# Patient Record
Sex: Female | Born: 1945 | Race: White | Hispanic: No | Marital: Married | Smoking: Never smoker
Health system: Southern US, Community
[De-identification: ages and names within clinical notes are randomized; demographics above are authoritative.]

## PROBLEM LIST (undated history)

## (undated) DIAGNOSIS — A310 Pulmonary mycobacterial infection: Secondary | ICD-10-CM

## (undated) DIAGNOSIS — E785 Hyperlipidemia, unspecified: Secondary | ICD-10-CM

## (undated) HISTORY — PX: BREAST CYST ASPIRATION: SHX578

## (undated) HISTORY — DX: Hyperlipidemia, unspecified: E78.5

## (undated) HISTORY — PX: ABDOMINAL HYSTERECTOMY: SHX81

## (undated) HISTORY — DX: Pulmonary mycobacterial infection: A31.0

---
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
## 2005-06-19 ENCOUNTER — Encounter: Admission: RE | Admit: 2005-06-19 | Discharge: 2005-06-19 | Payer: Self-pay | Admitting: Family Medicine

## 2006-02-27 ENCOUNTER — Encounter: Admission: RE | Admit: 2006-02-27 | Discharge: 2006-02-27 | Payer: Self-pay | Admitting: Family Medicine

## 2007-03-26 ENCOUNTER — Encounter: Admission: RE | Admit: 2007-03-26 | Discharge: 2007-03-26 | Payer: Self-pay | Admitting: Family Medicine

## 2008-05-25 ENCOUNTER — Encounter: Admission: RE | Admit: 2008-05-25 | Discharge: 2008-05-25 | Payer: Self-pay | Admitting: Family Medicine

## 2010-03-05 ENCOUNTER — Encounter: Admission: RE | Admit: 2010-03-05 | Discharge: 2010-03-05 | Payer: Self-pay

## 2010-03-12 ENCOUNTER — Encounter: Admission: RE | Admit: 2010-03-12 | Discharge: 2010-03-12 | Payer: Self-pay

## 2010-12-07 ENCOUNTER — Other Ambulatory Visit: Payer: Self-pay | Admitting: Obstetrics and Gynecology

## 2010-12-07 DIAGNOSIS — N644 Mastodynia: Secondary | ICD-10-CM

## 2010-12-07 DIAGNOSIS — N6320 Unspecified lump in the left breast, unspecified quadrant: Secondary | ICD-10-CM

## 2010-12-07 DIAGNOSIS — M8580 Other specified disorders of bone density and structure, unspecified site: Secondary | ICD-10-CM

## 2010-12-14 ENCOUNTER — Other Ambulatory Visit: Payer: Self-pay

## 2010-12-24 ENCOUNTER — Other Ambulatory Visit: Payer: Self-pay

## 2010-12-25 ENCOUNTER — Ambulatory Visit
Admission: RE | Admit: 2010-12-25 | Discharge: 2010-12-25 | Disposition: A | Discharge status: Home / Self Care | Payer: Medicare Other | Source: Ambulatory Visit | Attending: Obstetrics and Gynecology | Admitting: Obstetrics and Gynecology

## 2010-12-25 ENCOUNTER — Other Ambulatory Visit: Payer: Self-pay | Admitting: Obstetrics and Gynecology

## 2010-12-25 DIAGNOSIS — N6320 Unspecified lump in the left breast, unspecified quadrant: Secondary | ICD-10-CM

## 2010-12-25 DIAGNOSIS — N644 Mastodynia: Secondary | ICD-10-CM

## 2010-12-25 DIAGNOSIS — M8580 Other specified disorders of bone density and structure, unspecified site: Secondary | ICD-10-CM

## 2011-04-19 ENCOUNTER — Other Ambulatory Visit: Payer: Self-pay | Admitting: Obstetrics and Gynecology

## 2011-04-19 DIAGNOSIS — Z1231 Encounter for screening mammogram for malignant neoplasm of breast: Secondary | ICD-10-CM

## 2011-05-15 ENCOUNTER — Ambulatory Visit
Admission: RE | Admit: 2011-05-15 | Discharge: 2011-05-15 | Disposition: A | Discharge status: Home / Self Care | Payer: Medicare Other | Source: Ambulatory Visit | Attending: Obstetrics and Gynecology | Admitting: Obstetrics and Gynecology

## 2011-05-15 DIAGNOSIS — Z1231 Encounter for screening mammogram for malignant neoplasm of breast: Secondary | ICD-10-CM

## 2011-07-03 DIAGNOSIS — Z23 Encounter for immunization: Secondary | ICD-10-CM | POA: Diagnosis not present

## 2011-08-19 DIAGNOSIS — F4323 Adjustment disorder with mixed anxiety and depressed mood: Secondary | ICD-10-CM | POA: Diagnosis not present

## 2011-08-22 DIAGNOSIS — F4323 Adjustment disorder with mixed anxiety and depressed mood: Secondary | ICD-10-CM | POA: Diagnosis not present

## 2011-08-29 DIAGNOSIS — F4323 Adjustment disorder with mixed anxiety and depressed mood: Secondary | ICD-10-CM | POA: Diagnosis not present

## 2011-09-04 DIAGNOSIS — F4323 Adjustment disorder with mixed anxiety and depressed mood: Secondary | ICD-10-CM | POA: Diagnosis not present

## 2012-02-26 DIAGNOSIS — R7309 Other abnormal glucose: Secondary | ICD-10-CM | POA: Diagnosis not present

## 2012-02-26 DIAGNOSIS — Z0000 Encounter for general adult medical examination without abnormal findings: Secondary | ICD-10-CM | POA: Diagnosis not present

## 2012-02-26 DIAGNOSIS — E785 Hyperlipidemia, unspecified: Secondary | ICD-10-CM | POA: Diagnosis not present

## 2012-02-26 DIAGNOSIS — D518 Other vitamin B12 deficiency anemias: Secondary | ICD-10-CM | POA: Diagnosis not present

## 2012-02-26 DIAGNOSIS — E559 Vitamin D deficiency, unspecified: Secondary | ICD-10-CM | POA: Diagnosis not present

## 2012-04-20 ENCOUNTER — Other Ambulatory Visit: Payer: Self-pay | Admitting: Internal Medicine

## 2012-04-20 DIAGNOSIS — Z1231 Encounter for screening mammogram for malignant neoplasm of breast: Secondary | ICD-10-CM

## 2012-04-21 DIAGNOSIS — K5730 Diverticulosis of large intestine without perforation or abscess without bleeding: Secondary | ICD-10-CM | POA: Diagnosis not present

## 2012-04-21 DIAGNOSIS — Z800 Family history of malignant neoplasm of digestive organs: Secondary | ICD-10-CM | POA: Diagnosis not present

## 2012-04-21 DIAGNOSIS — Z1211 Encounter for screening for malignant neoplasm of colon: Secondary | ICD-10-CM | POA: Diagnosis not present

## 2012-05-22 DIAGNOSIS — Z23 Encounter for immunization: Secondary | ICD-10-CM | POA: Diagnosis not present

## 2012-05-25 ENCOUNTER — Ambulatory Visit: Payer: Medicare Other

## 2012-06-02 ENCOUNTER — Ambulatory Visit
Admission: RE | Admit: 2012-06-02 | Discharge: 2012-06-02 | Disposition: A | Discharge status: Home / Self Care | Payer: Medicare Other | Source: Ambulatory Visit | Attending: Internal Medicine | Admitting: Internal Medicine

## 2012-06-02 DIAGNOSIS — Z1231 Encounter for screening mammogram for malignant neoplasm of breast: Secondary | ICD-10-CM | POA: Diagnosis not present

## 2012-06-15 DIAGNOSIS — Z1211 Encounter for screening for malignant neoplasm of colon: Secondary | ICD-10-CM | POA: Diagnosis not present

## 2012-06-15 DIAGNOSIS — Z800 Family history of malignant neoplasm of digestive organs: Secondary | ICD-10-CM | POA: Diagnosis not present

## 2012-06-15 DIAGNOSIS — D126 Benign neoplasm of colon, unspecified: Secondary | ICD-10-CM | POA: Diagnosis not present

## 2012-09-25 DIAGNOSIS — J0110 Acute frontal sinusitis, unspecified: Secondary | ICD-10-CM | POA: Diagnosis not present

## 2012-11-10 DIAGNOSIS — H43399 Other vitreous opacities, unspecified eye: Secondary | ICD-10-CM | POA: Diagnosis not present

## 2012-11-10 DIAGNOSIS — H04209 Unspecified epiphora, unspecified lacrimal gland: Secondary | ICD-10-CM | POA: Diagnosis not present

## 2013-02-26 DIAGNOSIS — Z9181 History of falling: Secondary | ICD-10-CM | POA: Diagnosis not present

## 2013-02-26 DIAGNOSIS — R209 Unspecified disturbances of skin sensation: Secondary | ICD-10-CM | POA: Diagnosis not present

## 2013-02-26 DIAGNOSIS — Z0000 Encounter for general adult medical examination without abnormal findings: Secondary | ICD-10-CM | POA: Diagnosis not present

## 2013-02-26 DIAGNOSIS — Z0100 Encounter for examination of eyes and vision without abnormal findings: Secondary | ICD-10-CM | POA: Diagnosis not present

## 2013-02-26 DIAGNOSIS — E785 Hyperlipidemia, unspecified: Secondary | ICD-10-CM | POA: Diagnosis not present

## 2013-02-26 DIAGNOSIS — E559 Vitamin D deficiency, unspecified: Secondary | ICD-10-CM | POA: Diagnosis not present

## 2013-02-26 DIAGNOSIS — Z0110 Encounter for examination of ears and hearing without abnormal findings: Secondary | ICD-10-CM | POA: Diagnosis not present

## 2013-05-03 ENCOUNTER — Other Ambulatory Visit: Payer: Self-pay

## 2013-05-03 DIAGNOSIS — Z1231 Encounter for screening mammogram for malignant neoplasm of breast: Secondary | ICD-10-CM

## 2013-05-07 DIAGNOSIS — Z23 Encounter for immunization: Secondary | ICD-10-CM | POA: Diagnosis not present

## 2013-06-02 DIAGNOSIS — H9319 Tinnitus, unspecified ear: Secondary | ICD-10-CM | POA: Diagnosis not present

## 2013-06-02 DIAGNOSIS — H903 Sensorineural hearing loss, bilateral: Secondary | ICD-10-CM | POA: Diagnosis not present

## 2013-06-04 ENCOUNTER — Ambulatory Visit: Payer: Medicare Other

## 2013-06-04 ENCOUNTER — Ambulatory Visit
Admission: RE | Admit: 2013-06-04 | Discharge: 2013-06-04 | Disposition: A | Discharge status: Home / Self Care | Payer: Medicare Other | Source: Ambulatory Visit

## 2013-06-04 DIAGNOSIS — Z1231 Encounter for screening mammogram for malignant neoplasm of breast: Secondary | ICD-10-CM | POA: Diagnosis not present

## 2013-08-27 DIAGNOSIS — M25559 Pain in unspecified hip: Secondary | ICD-10-CM | POA: Diagnosis not present

## 2013-09-02 DIAGNOSIS — M79609 Pain in unspecified limb: Secondary | ICD-10-CM | POA: Diagnosis not present

## 2013-12-06 DIAGNOSIS — M1610 Unilateral primary osteoarthritis, unspecified hip: Secondary | ICD-10-CM | POA: Diagnosis not present

## 2013-12-06 DIAGNOSIS — M169 Osteoarthritis of hip, unspecified: Secondary | ICD-10-CM | POA: Diagnosis not present

## 2013-12-06 DIAGNOSIS — M999 Biomechanical lesion, unspecified: Secondary | ICD-10-CM | POA: Diagnosis not present

## 2013-12-06 DIAGNOSIS — M76899 Other specified enthesopathies of unspecified lower limb, excluding foot: Secondary | ICD-10-CM | POA: Diagnosis not present

## 2013-12-08 DIAGNOSIS — M169 Osteoarthritis of hip, unspecified: Secondary | ICD-10-CM | POA: Diagnosis not present

## 2013-12-08 DIAGNOSIS — M1610 Unilateral primary osteoarthritis, unspecified hip: Secondary | ICD-10-CM | POA: Diagnosis not present

## 2013-12-08 DIAGNOSIS — M76899 Other specified enthesopathies of unspecified lower limb, excluding foot: Secondary | ICD-10-CM | POA: Diagnosis not present

## 2013-12-08 DIAGNOSIS — M999 Biomechanical lesion, unspecified: Secondary | ICD-10-CM | POA: Diagnosis not present

## 2013-12-13 DIAGNOSIS — M169 Osteoarthritis of hip, unspecified: Secondary | ICD-10-CM | POA: Diagnosis not present

## 2013-12-13 DIAGNOSIS — M1610 Unilateral primary osteoarthritis, unspecified hip: Secondary | ICD-10-CM | POA: Diagnosis not present

## 2013-12-13 DIAGNOSIS — M999 Biomechanical lesion, unspecified: Secondary | ICD-10-CM | POA: Diagnosis not present

## 2013-12-13 DIAGNOSIS — M76899 Other specified enthesopathies of unspecified lower limb, excluding foot: Secondary | ICD-10-CM | POA: Diagnosis not present

## 2013-12-15 DIAGNOSIS — M76899 Other specified enthesopathies of unspecified lower limb, excluding foot: Secondary | ICD-10-CM | POA: Diagnosis not present

## 2013-12-15 DIAGNOSIS — M999 Biomechanical lesion, unspecified: Secondary | ICD-10-CM | POA: Diagnosis not present

## 2013-12-15 DIAGNOSIS — M1610 Unilateral primary osteoarthritis, unspecified hip: Secondary | ICD-10-CM | POA: Diagnosis not present

## 2013-12-15 DIAGNOSIS — M169 Osteoarthritis of hip, unspecified: Secondary | ICD-10-CM | POA: Diagnosis not present

## 2013-12-22 DIAGNOSIS — M169 Osteoarthritis of hip, unspecified: Secondary | ICD-10-CM | POA: Diagnosis not present

## 2013-12-22 DIAGNOSIS — M1610 Unilateral primary osteoarthritis, unspecified hip: Secondary | ICD-10-CM | POA: Diagnosis not present

## 2013-12-27 DIAGNOSIS — M25559 Pain in unspecified hip: Secondary | ICD-10-CM | POA: Diagnosis not present

## 2014-02-01 DIAGNOSIS — M25559 Pain in unspecified hip: Secondary | ICD-10-CM | POA: Diagnosis not present

## 2014-03-07 DIAGNOSIS — Z0000 Encounter for general adult medical examination without abnormal findings: Secondary | ICD-10-CM | POA: Diagnosis not present

## 2014-03-07 DIAGNOSIS — E7800 Pure hypercholesterolemia, unspecified: Secondary | ICD-10-CM | POA: Diagnosis not present

## 2014-03-07 DIAGNOSIS — E559 Vitamin D deficiency, unspecified: Secondary | ICD-10-CM | POA: Diagnosis not present

## 2014-03-07 DIAGNOSIS — N951 Menopausal and female climacteric states: Secondary | ICD-10-CM | POA: Diagnosis not present

## 2014-03-07 DIAGNOSIS — M948X9 Other specified disorders of cartilage, unspecified sites: Secondary | ICD-10-CM | POA: Diagnosis not present

## 2014-03-07 DIAGNOSIS — M2550 Pain in unspecified joint: Secondary | ICD-10-CM | POA: Diagnosis not present

## 2014-03-07 DIAGNOSIS — R7309 Other abnormal glucose: Secondary | ICD-10-CM | POA: Diagnosis not present

## 2014-03-07 DIAGNOSIS — M25559 Pain in unspecified hip: Secondary | ICD-10-CM | POA: Diagnosis not present

## 2014-05-03 ENCOUNTER — Other Ambulatory Visit: Payer: Self-pay | Admitting: Internal Medicine

## 2014-05-04 ENCOUNTER — Other Ambulatory Visit: Payer: Self-pay

## 2014-05-04 ENCOUNTER — Other Ambulatory Visit: Payer: Self-pay | Admitting: Internal Medicine

## 2014-05-04 DIAGNOSIS — M8580 Other specified disorders of bone density and structure, unspecified site: Secondary | ICD-10-CM

## 2014-05-04 DIAGNOSIS — Z1231 Encounter for screening mammogram for malignant neoplasm of breast: Secondary | ICD-10-CM

## 2014-05-09 DIAGNOSIS — L62 Nail disorders in diseases classified elsewhere: Secondary | ICD-10-CM | POA: Diagnosis not present

## 2014-05-09 DIAGNOSIS — B351 Tinea unguium: Secondary | ICD-10-CM | POA: Diagnosis not present

## 2014-05-31 DIAGNOSIS — Z23 Encounter for immunization: Secondary | ICD-10-CM | POA: Diagnosis not present

## 2014-06-06 ENCOUNTER — Ambulatory Visit
Admission: RE | Admit: 2014-06-06 | Discharge: 2014-06-06 | Disposition: A | Discharge status: Home / Self Care | Payer: Medicare Other | Source: Ambulatory Visit | Attending: Internal Medicine | Admitting: Internal Medicine

## 2014-06-06 ENCOUNTER — Ambulatory Visit
Admission: RE | Admit: 2014-06-06 | Discharge: 2014-06-06 | Disposition: A | Discharge status: Home / Self Care | Payer: Medicare Other | Source: Ambulatory Visit

## 2014-06-06 DIAGNOSIS — M8580 Other specified disorders of bone density and structure, unspecified site: Secondary | ICD-10-CM

## 2014-06-06 DIAGNOSIS — M85852 Other specified disorders of bone density and structure, left thigh: Secondary | ICD-10-CM | POA: Diagnosis not present

## 2014-06-06 DIAGNOSIS — Z1231 Encounter for screening mammogram for malignant neoplasm of breast: Secondary | ICD-10-CM | POA: Diagnosis not present

## 2014-06-06 DIAGNOSIS — Z780 Asymptomatic menopausal state: Secondary | ICD-10-CM | POA: Diagnosis not present

## 2014-06-07 ENCOUNTER — Other Ambulatory Visit: Payer: Self-pay | Admitting: Internal Medicine

## 2014-06-07 DIAGNOSIS — R928 Other abnormal and inconclusive findings on diagnostic imaging of breast: Secondary | ICD-10-CM

## 2014-06-15 ENCOUNTER — Ambulatory Visit
Admission: RE | Admit: 2014-06-15 | Discharge: 2014-06-15 | Disposition: A | Discharge status: Home / Self Care | Payer: Medicare Other | Source: Ambulatory Visit | Attending: Internal Medicine | Admitting: Internal Medicine

## 2014-06-15 DIAGNOSIS — N6001 Solitary cyst of right breast: Secondary | ICD-10-CM | POA: Diagnosis not present

## 2014-06-15 DIAGNOSIS — R928 Other abnormal and inconclusive findings on diagnostic imaging of breast: Secondary | ICD-10-CM

## 2014-10-12 DIAGNOSIS — M1611 Unilateral primary osteoarthritis, right hip: Secondary | ICD-10-CM | POA: Diagnosis not present

## 2014-10-12 DIAGNOSIS — M79604 Pain in right leg: Secondary | ICD-10-CM | POA: Diagnosis not present

## 2014-10-12 DIAGNOSIS — M25551 Pain in right hip: Secondary | ICD-10-CM | POA: Diagnosis not present

## 2015-02-01 DIAGNOSIS — H2513 Age-related nuclear cataract, bilateral: Secondary | ICD-10-CM | POA: Diagnosis not present

## 2015-03-20 DIAGNOSIS — E785 Hyperlipidemia, unspecified: Secondary | ICD-10-CM | POA: Diagnosis not present

## 2015-03-20 DIAGNOSIS — E559 Vitamin D deficiency, unspecified: Secondary | ICD-10-CM | POA: Diagnosis not present

## 2015-03-20 DIAGNOSIS — R7309 Other abnormal glucose: Secondary | ICD-10-CM | POA: Diagnosis not present

## 2015-03-20 DIAGNOSIS — Z0000 Encounter for general adult medical examination without abnormal findings: Secondary | ICD-10-CM | POA: Diagnosis not present

## 2015-03-28 DIAGNOSIS — Z0000 Encounter for general adult medical examination without abnormal findings: Secondary | ICD-10-CM | POA: Diagnosis not present

## 2015-03-28 DIAGNOSIS — M25551 Pain in right hip: Secondary | ICD-10-CM | POA: Diagnosis not present

## 2015-03-28 DIAGNOSIS — E559 Vitamin D deficiency, unspecified: Secondary | ICD-10-CM | POA: Diagnosis not present

## 2015-03-28 DIAGNOSIS — Z23 Encounter for immunization: Secondary | ICD-10-CM | POA: Diagnosis not present

## 2015-03-28 DIAGNOSIS — R7309 Other abnormal glucose: Secondary | ICD-10-CM | POA: Diagnosis not present

## 2015-05-08 ENCOUNTER — Other Ambulatory Visit: Payer: Self-pay

## 2015-05-08 DIAGNOSIS — Z1231 Encounter for screening mammogram for malignant neoplasm of breast: Secondary | ICD-10-CM

## 2015-06-08 ENCOUNTER — Ambulatory Visit
Admission: RE | Admit: 2015-06-08 | Discharge: 2015-06-08 | Disposition: A | Discharge status: Home / Self Care | Payer: Medicare Other | Source: Ambulatory Visit

## 2015-06-08 DIAGNOSIS — Z1231 Encounter for screening mammogram for malignant neoplasm of breast: Secondary | ICD-10-CM | POA: Diagnosis not present

## 2015-06-14 ENCOUNTER — Other Ambulatory Visit: Payer: Self-pay | Admitting: Internal Medicine

## 2015-06-14 DIAGNOSIS — R928 Other abnormal and inconclusive findings on diagnostic imaging of breast: Secondary | ICD-10-CM

## 2015-06-21 ENCOUNTER — Ambulatory Visit
Admission: RE | Admit: 2015-06-21 | Discharge: 2015-06-21 | Disposition: A | Discharge status: Home / Self Care | Payer: Medicare Other | Source: Ambulatory Visit | Attending: Internal Medicine | Admitting: Internal Medicine

## 2015-06-21 ENCOUNTER — Other Ambulatory Visit: Payer: Medicare Other

## 2015-06-21 DIAGNOSIS — R928 Other abnormal and inconclusive findings on diagnostic imaging of breast: Secondary | ICD-10-CM

## 2015-06-21 DIAGNOSIS — N6001 Solitary cyst of right breast: Secondary | ICD-10-CM | POA: Diagnosis not present

## 2015-07-27 DIAGNOSIS — J321 Chronic frontal sinusitis: Secondary | ICD-10-CM | POA: Diagnosis not present

## 2015-09-07 DIAGNOSIS — L02612 Cutaneous abscess of left foot: Secondary | ICD-10-CM | POA: Diagnosis not present

## 2015-09-07 DIAGNOSIS — L602 Onychogryphosis: Secondary | ICD-10-CM | POA: Diagnosis not present

## 2015-09-07 DIAGNOSIS — M79675 Pain in left toe(s): Secondary | ICD-10-CM | POA: Diagnosis not present

## 2015-09-07 DIAGNOSIS — L03032 Cellulitis of left toe: Secondary | ICD-10-CM | POA: Diagnosis not present

## 2015-09-08 DIAGNOSIS — L602 Onychogryphosis: Secondary | ICD-10-CM | POA: Diagnosis not present

## 2015-09-08 DIAGNOSIS — B351 Tinea unguium: Secondary | ICD-10-CM | POA: Diagnosis not present

## 2015-09-18 DIAGNOSIS — L03032 Cellulitis of left toe: Secondary | ICD-10-CM | POA: Diagnosis not present

## 2015-09-18 DIAGNOSIS — L602 Onychogryphosis: Secondary | ICD-10-CM | POA: Diagnosis not present

## 2015-10-03 ENCOUNTER — Other Ambulatory Visit: Payer: Self-pay

## 2015-10-03 DIAGNOSIS — Z1231 Encounter for screening mammogram for malignant neoplasm of breast: Secondary | ICD-10-CM

## 2016-01-18 DIAGNOSIS — F329 Major depressive disorder, single episode, unspecified: Secondary | ICD-10-CM | POA: Diagnosis not present

## 2016-01-18 DIAGNOSIS — R7309 Other abnormal glucose: Secondary | ICD-10-CM | POA: Diagnosis not present

## 2016-01-18 DIAGNOSIS — R5383 Other fatigue: Secondary | ICD-10-CM | POA: Diagnosis not present

## 2016-01-18 DIAGNOSIS — Z87891 Personal history of nicotine dependence: Secondary | ICD-10-CM | POA: Diagnosis not present

## 2016-02-26 DIAGNOSIS — L602 Onychogryphosis: Secondary | ICD-10-CM | POA: Diagnosis not present

## 2016-02-26 DIAGNOSIS — L03031 Cellulitis of right toe: Secondary | ICD-10-CM | POA: Diagnosis not present

## 2016-02-26 DIAGNOSIS — M79675 Pain in left toe(s): Secondary | ICD-10-CM | POA: Diagnosis not present

## 2016-02-26 DIAGNOSIS — L02612 Cutaneous abscess of left foot: Secondary | ICD-10-CM | POA: Diagnosis not present

## 2016-02-26 DIAGNOSIS — L03032 Cellulitis of left toe: Secondary | ICD-10-CM | POA: Diagnosis not present

## 2016-03-05 DIAGNOSIS — Z79899 Other long term (current) drug therapy: Secondary | ICD-10-CM | POA: Diagnosis not present

## 2016-03-05 DIAGNOSIS — G4700 Insomnia, unspecified: Secondary | ICD-10-CM | POA: Diagnosis not present

## 2016-03-05 DIAGNOSIS — F329 Major depressive disorder, single episode, unspecified: Secondary | ICD-10-CM | POA: Diagnosis not present

## 2016-04-11 DIAGNOSIS — R7309 Other abnormal glucose: Secondary | ICD-10-CM | POA: Diagnosis not present

## 2016-04-11 DIAGNOSIS — E7800 Pure hypercholesterolemia, unspecified: Secondary | ICD-10-CM | POA: Diagnosis not present

## 2016-04-17 DIAGNOSIS — Z01419 Encounter for gynecological examination (general) (routine) without abnormal findings: Secondary | ICD-10-CM | POA: Diagnosis not present

## 2016-04-17 DIAGNOSIS — Z87891 Personal history of nicotine dependence: Secondary | ICD-10-CM | POA: Diagnosis not present

## 2016-04-17 DIAGNOSIS — Z0000 Encounter for general adult medical examination without abnormal findings: Secondary | ICD-10-CM | POA: Diagnosis not present

## 2016-04-17 DIAGNOSIS — Z1212 Encounter for screening for malignant neoplasm of rectum: Secondary | ICD-10-CM | POA: Diagnosis not present

## 2016-04-17 DIAGNOSIS — Z6827 Body mass index (BMI) 27.0-27.9, adult: Secondary | ICD-10-CM | POA: Diagnosis not present

## 2016-04-17 DIAGNOSIS — Z124 Encounter for screening for malignant neoplasm of cervix: Secondary | ICD-10-CM | POA: Diagnosis not present

## 2016-04-17 DIAGNOSIS — Z23 Encounter for immunization: Secondary | ICD-10-CM | POA: Diagnosis not present

## 2016-06-12 ENCOUNTER — Ambulatory Visit: Payer: Medicare Other

## 2016-07-10 DIAGNOSIS — L720 Epidermal cyst: Secondary | ICD-10-CM | POA: Diagnosis not present

## 2016-07-10 DIAGNOSIS — L814 Other melanin hyperpigmentation: Secondary | ICD-10-CM | POA: Diagnosis not present

## 2016-07-10 DIAGNOSIS — D485 Neoplasm of uncertain behavior of skin: Secondary | ICD-10-CM | POA: Diagnosis not present

## 2016-07-10 DIAGNOSIS — L738 Other specified follicular disorders: Secondary | ICD-10-CM | POA: Diagnosis not present

## 2016-07-10 DIAGNOSIS — L821 Other seborrheic keratosis: Secondary | ICD-10-CM | POA: Diagnosis not present

## 2016-07-10 DIAGNOSIS — L929 Granulomatous disorder of the skin and subcutaneous tissue, unspecified: Secondary | ICD-10-CM | POA: Diagnosis not present

## 2016-07-17 ENCOUNTER — Ambulatory Visit
Admission: RE | Admit: 2016-07-17 | Discharge: 2016-07-17 | Disposition: A | Discharge status: Home / Self Care | Payer: Medicare Other | Source: Ambulatory Visit

## 2016-07-17 DIAGNOSIS — Z1231 Encounter for screening mammogram for malignant neoplasm of breast: Secondary | ICD-10-CM | POA: Diagnosis not present

## 2016-07-18 ENCOUNTER — Other Ambulatory Visit: Payer: Self-pay | Admitting: Internal Medicine

## 2016-07-18 ENCOUNTER — Other Ambulatory Visit: Payer: Self-pay | Admitting: Nurse Practitioner

## 2016-07-18 DIAGNOSIS — R928 Other abnormal and inconclusive findings on diagnostic imaging of breast: Secondary | ICD-10-CM

## 2016-07-25 ENCOUNTER — Ambulatory Visit
Admission: RE | Admit: 2016-07-25 | Discharge: 2016-07-25 | Disposition: A | Discharge status: Home / Self Care | Payer: Medicare Other | Source: Ambulatory Visit | Attending: Internal Medicine | Admitting: Internal Medicine

## 2016-07-25 DIAGNOSIS — R928 Other abnormal and inconclusive findings on diagnostic imaging of breast: Secondary | ICD-10-CM

## 2016-07-25 DIAGNOSIS — N6001 Solitary cyst of right breast: Secondary | ICD-10-CM | POA: Diagnosis not present

## 2016-07-25 DIAGNOSIS — N6311 Unspecified lump in the right breast, upper outer quadrant: Secondary | ICD-10-CM | POA: Diagnosis not present

## 2016-07-29 ENCOUNTER — Other Ambulatory Visit: Payer: Medicare Other

## 2016-08-05 DIAGNOSIS — R7309 Other abnormal glucose: Secondary | ICD-10-CM | POA: Diagnosis not present

## 2016-08-05 DIAGNOSIS — G4700 Insomnia, unspecified: Secondary | ICD-10-CM | POA: Diagnosis not present

## 2016-08-05 DIAGNOSIS — R0982 Postnasal drip: Secondary | ICD-10-CM | POA: Diagnosis not present

## 2016-08-05 DIAGNOSIS — L659 Nonscarring hair loss, unspecified: Secondary | ICD-10-CM | POA: Diagnosis not present

## 2016-10-15 DIAGNOSIS — L658 Other specified nonscarring hair loss: Secondary | ICD-10-CM | POA: Diagnosis not present

## 2016-10-15 DIAGNOSIS — D2230 Melanocytic nevi of unspecified part of face: Secondary | ICD-10-CM | POA: Diagnosis not present

## 2016-10-15 DIAGNOSIS — L720 Epidermal cyst: Secondary | ICD-10-CM | POA: Diagnosis not present

## 2016-10-30 DIAGNOSIS — M25775 Osteophyte, left foot: Secondary | ICD-10-CM | POA: Diagnosis not present

## 2016-10-30 DIAGNOSIS — L600 Ingrowing nail: Secondary | ICD-10-CM | POA: Diagnosis not present

## 2016-12-11 DIAGNOSIS — J321 Chronic frontal sinusitis: Secondary | ICD-10-CM | POA: Diagnosis not present

## 2016-12-11 DIAGNOSIS — R0982 Postnasal drip: Secondary | ICD-10-CM | POA: Diagnosis not present

## 2017-01-06 ENCOUNTER — Other Ambulatory Visit: Payer: Self-pay

## 2017-04-15 DIAGNOSIS — Z0000 Encounter for general adult medical examination without abnormal findings: Secondary | ICD-10-CM | POA: Diagnosis not present

## 2017-04-15 DIAGNOSIS — E559 Vitamin D deficiency, unspecified: Secondary | ICD-10-CM | POA: Diagnosis not present

## 2017-04-15 DIAGNOSIS — Z79899 Other long term (current) drug therapy: Secondary | ICD-10-CM | POA: Diagnosis not present

## 2017-04-21 DIAGNOSIS — Z23 Encounter for immunization: Secondary | ICD-10-CM | POA: Diagnosis not present

## 2017-04-21 DIAGNOSIS — E2839 Other primary ovarian failure: Secondary | ICD-10-CM | POA: Diagnosis not present

## 2017-04-21 DIAGNOSIS — Z0000 Encounter for general adult medical examination without abnormal findings: Secondary | ICD-10-CM | POA: Diagnosis not present

## 2017-04-24 ENCOUNTER — Other Ambulatory Visit: Payer: Self-pay | Admitting: Internal Medicine

## 2017-04-24 DIAGNOSIS — E2839 Other primary ovarian failure: Secondary | ICD-10-CM

## 2017-06-13 ENCOUNTER — Ambulatory Visit
Admission: RE | Admit: 2017-06-13 | Discharge: 2017-06-13 | Disposition: A | Discharge status: Home / Self Care | Payer: Medicare Other | Source: Ambulatory Visit | Attending: Internal Medicine | Admitting: Internal Medicine

## 2017-06-13 DIAGNOSIS — E2839 Other primary ovarian failure: Secondary | ICD-10-CM

## 2017-06-13 DIAGNOSIS — M85852 Other specified disorders of bone density and structure, left thigh: Secondary | ICD-10-CM | POA: Diagnosis not present

## 2017-06-27 DIAGNOSIS — M25562 Pain in left knee: Secondary | ICD-10-CM | POA: Diagnosis not present

## 2017-06-27 DIAGNOSIS — M1712 Unilateral primary osteoarthritis, left knee: Secondary | ICD-10-CM | POA: Diagnosis not present

## 2017-07-02 ENCOUNTER — Other Ambulatory Visit: Payer: Self-pay | Admitting: Internal Medicine

## 2017-07-02 DIAGNOSIS — Z1231 Encounter for screening mammogram for malignant neoplasm of breast: Secondary | ICD-10-CM

## 2017-07-09 DIAGNOSIS — J320 Chronic maxillary sinusitis: Secondary | ICD-10-CM | POA: Diagnosis not present

## 2017-07-22 ENCOUNTER — Ambulatory Visit
Admission: RE | Admit: 2017-07-22 | Discharge: 2017-07-22 | Disposition: A | Discharge status: Home / Self Care | Payer: Medicare Other | Source: Ambulatory Visit | Attending: Internal Medicine | Admitting: Internal Medicine

## 2017-07-22 DIAGNOSIS — M1712 Unilateral primary osteoarthritis, left knee: Secondary | ICD-10-CM | POA: Diagnosis not present

## 2017-07-22 DIAGNOSIS — M25562 Pain in left knee: Secondary | ICD-10-CM | POA: Diagnosis not present

## 2017-07-22 DIAGNOSIS — Z1231 Encounter for screening mammogram for malignant neoplasm of breast: Secondary | ICD-10-CM

## 2017-07-23 DIAGNOSIS — M25562 Pain in left knee: Secondary | ICD-10-CM | POA: Diagnosis not present

## 2017-07-23 DIAGNOSIS — M1712 Unilateral primary osteoarthritis, left knee: Secondary | ICD-10-CM | POA: Diagnosis not present

## 2017-07-28 DIAGNOSIS — M25562 Pain in left knee: Secondary | ICD-10-CM | POA: Diagnosis not present

## 2017-07-28 DIAGNOSIS — M1712 Unilateral primary osteoarthritis, left knee: Secondary | ICD-10-CM | POA: Diagnosis not present

## 2017-07-30 DIAGNOSIS — M1712 Unilateral primary osteoarthritis, left knee: Secondary | ICD-10-CM | POA: Diagnosis not present

## 2017-07-30 DIAGNOSIS — M25562 Pain in left knee: Secondary | ICD-10-CM | POA: Diagnosis not present

## 2017-08-04 DIAGNOSIS — M1712 Unilateral primary osteoarthritis, left knee: Secondary | ICD-10-CM | POA: Diagnosis not present

## 2017-08-04 DIAGNOSIS — M25562 Pain in left knee: Secondary | ICD-10-CM | POA: Diagnosis not present

## 2017-08-06 DIAGNOSIS — M25562 Pain in left knee: Secondary | ICD-10-CM | POA: Diagnosis not present

## 2017-08-06 DIAGNOSIS — M1712 Unilateral primary osteoarthritis, left knee: Secondary | ICD-10-CM | POA: Diagnosis not present

## 2017-08-11 DIAGNOSIS — M1712 Unilateral primary osteoarthritis, left knee: Secondary | ICD-10-CM | POA: Diagnosis not present

## 2017-08-11 DIAGNOSIS — M25562 Pain in left knee: Secondary | ICD-10-CM | POA: Diagnosis not present

## 2017-08-13 DIAGNOSIS — M1712 Unilateral primary osteoarthritis, left knee: Secondary | ICD-10-CM | POA: Diagnosis not present

## 2017-08-13 DIAGNOSIS — M25562 Pain in left knee: Secondary | ICD-10-CM | POA: Diagnosis not present

## 2017-08-18 DIAGNOSIS — M25562 Pain in left knee: Secondary | ICD-10-CM | POA: Diagnosis not present

## 2017-08-18 DIAGNOSIS — M1712 Unilateral primary osteoarthritis, left knee: Secondary | ICD-10-CM | POA: Diagnosis not present

## 2017-08-21 DIAGNOSIS — M1712 Unilateral primary osteoarthritis, left knee: Secondary | ICD-10-CM | POA: Diagnosis not present

## 2017-08-21 DIAGNOSIS — M25562 Pain in left knee: Secondary | ICD-10-CM | POA: Diagnosis not present

## 2017-08-25 DIAGNOSIS — M25562 Pain in left knee: Secondary | ICD-10-CM | POA: Diagnosis not present

## 2017-08-25 DIAGNOSIS — M1712 Unilateral primary osteoarthritis, left knee: Secondary | ICD-10-CM | POA: Diagnosis not present

## 2017-08-29 DIAGNOSIS — M25562 Pain in left knee: Secondary | ICD-10-CM | POA: Diagnosis not present

## 2017-08-29 DIAGNOSIS — M1712 Unilateral primary osteoarthritis, left knee: Secondary | ICD-10-CM | POA: Diagnosis not present

## 2017-09-01 DIAGNOSIS — M1712 Unilateral primary osteoarthritis, left knee: Secondary | ICD-10-CM | POA: Diagnosis not present

## 2017-09-01 DIAGNOSIS — M25562 Pain in left knee: Secondary | ICD-10-CM | POA: Diagnosis not present

## 2017-09-03 DIAGNOSIS — M25562 Pain in left knee: Secondary | ICD-10-CM | POA: Diagnosis not present

## 2017-09-03 DIAGNOSIS — M1712 Unilateral primary osteoarthritis, left knee: Secondary | ICD-10-CM | POA: Diagnosis not present

## 2017-09-05 DIAGNOSIS — M1712 Unilateral primary osteoarthritis, left knee: Secondary | ICD-10-CM | POA: Diagnosis not present

## 2017-09-05 DIAGNOSIS — M25562 Pain in left knee: Secondary | ICD-10-CM | POA: Diagnosis not present

## 2017-09-09 DIAGNOSIS — M25562 Pain in left knee: Secondary | ICD-10-CM | POA: Diagnosis not present

## 2017-09-09 DIAGNOSIS — M1712 Unilateral primary osteoarthritis, left knee: Secondary | ICD-10-CM | POA: Diagnosis not present

## 2017-09-11 DIAGNOSIS — M25562 Pain in left knee: Secondary | ICD-10-CM | POA: Diagnosis not present

## 2017-09-11 DIAGNOSIS — M1712 Unilateral primary osteoarthritis, left knee: Secondary | ICD-10-CM | POA: Diagnosis not present

## 2017-09-15 DIAGNOSIS — M25562 Pain in left knee: Secondary | ICD-10-CM | POA: Diagnosis not present

## 2017-09-15 DIAGNOSIS — M1712 Unilateral primary osteoarthritis, left knee: Secondary | ICD-10-CM | POA: Diagnosis not present

## 2017-09-17 DIAGNOSIS — M1712 Unilateral primary osteoarthritis, left knee: Secondary | ICD-10-CM | POA: Diagnosis not present

## 2017-09-17 DIAGNOSIS — M25562 Pain in left knee: Secondary | ICD-10-CM | POA: Diagnosis not present

## 2017-11-11 DIAGNOSIS — R490 Dysphonia: Secondary | ICD-10-CM | POA: Diagnosis not present

## 2017-11-11 DIAGNOSIS — J0130 Acute sphenoidal sinusitis, unspecified: Secondary | ICD-10-CM | POA: Diagnosis not present

## 2017-11-11 DIAGNOSIS — Z7289 Other problems related to lifestyle: Secondary | ICD-10-CM | POA: Diagnosis not present

## 2017-11-11 DIAGNOSIS — J310 Chronic rhinitis: Secondary | ICD-10-CM | POA: Diagnosis not present

## 2017-11-11 DIAGNOSIS — R05 Cough: Secondary | ICD-10-CM | POA: Diagnosis not present

## 2017-11-11 DIAGNOSIS — R0602 Shortness of breath: Secondary | ICD-10-CM | POA: Diagnosis not present

## 2017-11-11 DIAGNOSIS — J323 Chronic sphenoidal sinusitis: Secondary | ICD-10-CM | POA: Diagnosis not present

## 2017-11-11 DIAGNOSIS — R059 Cough, unspecified: Secondary | ICD-10-CM | POA: Insufficient documentation

## 2017-11-21 ENCOUNTER — Encounter: Payer: Self-pay | Admitting: Podiatry

## 2017-11-21 ENCOUNTER — Ambulatory Visit (INDEPENDENT_AMBULATORY_CARE_PROVIDER_SITE_OTHER): Payer: Medicare Other | Admitting: Podiatry

## 2017-11-21 DIAGNOSIS — L600 Ingrowing nail: Secondary | ICD-10-CM

## 2017-11-21 DIAGNOSIS — M79676 Pain in unspecified toe(s): Secondary | ICD-10-CM | POA: Diagnosis not present

## 2017-11-21 NOTE — Patient Instructions (Signed)

## 2017-11-27 ENCOUNTER — Telehealth: Payer: Self-pay | Admitting: Podiatry

## 2017-11-27 NOTE — Telephone Encounter (Signed)
I informed pt the soaks would need to continue for 2-4 weeks until the area gets a dry hard scab without redness, swelling or drainage. Pt states she will be traveling. I told pt she could shower as normal, before getting out of the shower, take a clean wet washcloth and squeeze on antibacterial soap and wipe down the procedure area gently, then rinse well, pat dry and cover with an antibiotic ointment bandaid. Pt states understanding.

## 2017-11-27 NOTE — Telephone Encounter (Signed)
Pt had a toenail removed off of her left foot great toe and wanted to know when she could stop soaking it.

## 2017-11-28 ENCOUNTER — Ambulatory Visit
Admission: RE | Admit: 2017-11-28 | Discharge: 2017-11-28 | Disposition: A | Discharge status: Home / Self Care | Payer: Medicare Other | Source: Ambulatory Visit | Attending: Otolaryngology | Admitting: Otolaryngology

## 2017-11-28 ENCOUNTER — Other Ambulatory Visit: Payer: Self-pay | Admitting: Otolaryngology

## 2017-11-28 DIAGNOSIS — R05 Cough: Secondary | ICD-10-CM

## 2017-11-28 DIAGNOSIS — R059 Cough, unspecified: Secondary | ICD-10-CM

## 2017-12-16 DIAGNOSIS — J181 Lobar pneumonia, unspecified organism: Secondary | ICD-10-CM | POA: Diagnosis not present

## 2017-12-16 DIAGNOSIS — J323 Chronic sphenoidal sinusitis: Secondary | ICD-10-CM | POA: Diagnosis not present

## 2018-01-04 NOTE — Progress Notes (Signed)
  Subjective:  Patient ID: Tracey Castro, female    DOB: [birth year],  MRN: [id number]  Chief Complaint  Patient presents with  . Nail Problem    left great toenail discolored and thick - has a lump in it - has tried soaking in epsom salts   72 y.o. female presents with the above complaint.  Reports that the left great toenail is discolored and thick has a lump in it believes it to be growing.  Has tried soaking Epson salt. History reviewed. No pertinent past medical history. Past Surgical History:  Procedure Laterality Date  . BREAST CYST ASPIRATION      Current Outpatient Medications:  .  albuterol (PROVENTIL HFA;VENTOLIN HFA) 108 (90 Base) MCG/ACT inhaler, Inhale into the lungs., Disp: , Rfl:  .  clindamycin (CLEOCIN) 150 MG capsule, TAKE 1 CAPSULE (150 MG TOTAL) BY MOUTH 3 TIMES DAILY FOR 30 DAYS., Disp: , Rfl: 0 .  fluticasone (FLONASE) 50 MCG/ACT nasal spray, Place into the nose., Disp: , Rfl:   Allergies  Allergen Reactions  . Sulfamethoxazole Rash   Review of Systems: Negative except as noted in the HPI. Denies N/V/F/Ch. Objective:  There were no vitals filed for this visit. General AA&O x3. Normal mood and affect.  Vascular Dorsalis pedis and posterior tibial pulses  present 2+ bilaterally  Capillary refill normal to all digits. Pedal hair growth normal.  Neurologic Epicritic sensation grossly present.  Dermatologic No open lesions. Interspaces clear of maceration. Ingrowing nail medial aspect left great toenail  Orthopedic: MMT 5/5 in dorsiflexion, plantarflexion, inversion, and eversion. Normal joint ROM without pain or crepitus.   Assessment & Plan:  Patient was evaluated and treated and all questions answered.  Ingrown Nail, left -Patient elects to proceed with ingrown toenail removal today -Ingrown nail excised. See procedure note. -Educated on post-procedure care including soaking. Written instructions provided. -Patient to follow up in 2 weeks for nail  check.  Procedure: Excision of Ingrown Toenail Location: Left 1st toe medial nail borders. Anesthesia: Lidocaine 1% plain; 1.56mL and Marcaine 0.5% plain; 1.38mL, digital block. Skin Prep: Betadine. Dressing: Silvadene; telfa; dry, sterile, compression dressing. Technique: Following skin prep, the toe was exsanguinated and a tourniquet was secured at the base of the toe. The affected nail border was freed, split with a nail splitter, and excised. Chemical matrixectomy was then performed with phenol and irrigated out with alcohol. The tourniquet was then removed and sterile dressing applied. Disposition: Patient tolerated procedure well. Patient to return in 2 weeks for follow-up.    Return if symptoms worsen or fail to improve.

## 2018-01-15 DIAGNOSIS — R0602 Shortness of breath: Secondary | ICD-10-CM | POA: Diagnosis not present

## 2018-01-16 ENCOUNTER — Ambulatory Visit
Admission: RE | Admit: 2018-01-16 | Discharge: 2018-01-16 | Disposition: A | Discharge status: Home / Self Care | Payer: Medicare Other | Source: Ambulatory Visit | Attending: Nurse Practitioner | Admitting: Nurse Practitioner

## 2018-01-16 ENCOUNTER — Other Ambulatory Visit: Payer: Self-pay | Admitting: Nurse Practitioner

## 2018-01-16 DIAGNOSIS — J189 Pneumonia, unspecified organism: Secondary | ICD-10-CM | POA: Diagnosis not present

## 2018-01-27 DIAGNOSIS — R0602 Shortness of breath: Secondary | ICD-10-CM | POA: Diagnosis not present

## 2018-01-27 DIAGNOSIS — R609 Edema, unspecified: Secondary | ICD-10-CM | POA: Diagnosis not present

## 2018-01-27 DIAGNOSIS — R202 Paresthesia of skin: Secondary | ICD-10-CM | POA: Diagnosis not present

## 2018-01-28 ENCOUNTER — Other Ambulatory Visit: Payer: Self-pay | Admitting: Nurse Practitioner

## 2018-01-28 DIAGNOSIS — R9389 Abnormal findings on diagnostic imaging of other specified body structures: Secondary | ICD-10-CM

## 2018-02-02 ENCOUNTER — Ambulatory Visit
Admission: RE | Admit: 2018-02-02 | Discharge: 2018-02-02 | Disposition: A | Discharge status: Home / Self Care | Payer: Medicare Other | Source: Ambulatory Visit | Attending: Nurse Practitioner | Admitting: Nurse Practitioner

## 2018-02-02 DIAGNOSIS — R9389 Abnormal findings on diagnostic imaging of other specified body structures: Secondary | ICD-10-CM

## 2018-02-02 DIAGNOSIS — J181 Lobar pneumonia, unspecified organism: Secondary | ICD-10-CM | POA: Diagnosis not present

## 2018-02-19 ENCOUNTER — Encounter: Payer: Self-pay | Admitting: Infectious Diseases

## 2018-02-19 ENCOUNTER — Ambulatory Visit (INDEPENDENT_AMBULATORY_CARE_PROVIDER_SITE_OTHER): Payer: Medicare Other | Admitting: Infectious Diseases

## 2018-02-19 VITALS — BP 118/77 | HR 67 | Temp 97.7°F | Wt 143.4 lb

## 2018-02-19 DIAGNOSIS — A310 Pulmonary mycobacterial infection: Secondary | ICD-10-CM | POA: Diagnosis not present

## 2018-02-19 NOTE — Assessment & Plan Note (Signed)
We spoke about the potential dx- she has only 1 (radiologic) of 2 needed criteria (radiologic and Cx).  Will get sputum Cx, explained if these are (-) will send her for BAL.  Explained the chronicity of disease, length of anbx therapy, and need for multiple anbx in the course.  Explained that therapy does not always cure this and the treatment is often based on symptoms after initial therapy.  Will screen her for TB as well.  Will see her back in 6 weeks.

## 2018-02-19 NOTE — Progress Notes (Signed)
   Subjective:    Patient ID: Tracey Castro, female    DOB: Mar 31, 1946, 72 y.o.   MRN: [id number]  HPI 72 yo F with hx of hyperlipidemia, comes to ID clinic for chronic cough. She had worsening cough and sob 02-2017 and was given a z-pack.  She was seen 8-1 with episodes of SOB/DOE- she had been previously treated with levaquin by ENT for PNA (CXR showing lingular atelectasis and pna 11-28-17).  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   Has had significant cough and sputum production. Yellow. No hemoptysis.   She feels like her DOE has improved, can climb stairs better.   Labs 01-27-18 are notable for  Normal BMp, TSH, CBC.  Her Hgb A1C was 6.2% 03-2017.  Last colon ~ 5 yrs ago Last mammo- Feb 2019  The past medical history, family history and social history were reviewed/updated in EPIC  Smoked < 1/2 ppd briefly, socially.  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   Review of Systems  Constitutional: Negative for appetite change, chills, fever and unexpected weight change.  Respiratory: Positive for cough and shortness of breath.   Cardiovascular: Negative for leg swelling.  Gastrointestinal: Negative for constipation and diarrhea.  Genitourinary: Negative for difficulty urinating.  Hematological: Negative for adenopathy.  Please see HPI. All other systems reviewed and negative.      Objective:   Physical Exam  Constitutional: She is oriented to person, place, and time. She appears well-developed and well-nourished.  HENT:  Mouth/Throat: No oropharyngeal exudate.  Eyes: Pupils are equal, round, and reactive to light. EOM are normal.  Neck: Normal range of motion. Neck supple.  Cardiovascular: Normal rate, regular rhythm and normal heart sounds.  Pulmonary/Chest: Effort normal and breath sounds normal.    Abdominal: Soft. Bowel sounds are normal. She exhibits no distension. There is no tenderness.  Musculoskeletal: She exhibits no edema.  Lymphadenopathy:    She has no cervical adenopathy.  Neurological: She is alert and oriented to person, place, and time.  Skin: Skin is warm and dry.  Psychiatric: She has a normal mood and affect.       Assessment & Plan:

## 2018-02-19 NOTE — Addendum Note (Signed)
Addended by: Dolan Amen D on: 02/19/2018 10:26 AM   Modules accepted: Orders

## 2018-02-21 LAB — QUANTIFERON-TB GOLD PLUS
Mitogen-NIL: 6.69 IU/mL
NIL: 0.02 IU/mL
QuantiFERON-TB Gold Plus: NEGATIVE
TB1-NIL: 0.01 IU/mL
TB2-NIL: 0.01 IU/mL

## 2018-02-25 ENCOUNTER — Other Ambulatory Visit: Payer: Medicare Other

## 2018-02-25 ENCOUNTER — Telehealth: Payer: Self-pay

## 2018-02-25 NOTE — Telephone Encounter (Signed)
Patient was not able to produce sputum sample.  She says you suggested a bronchoscopy if this happens.    Please advise.   Laverle Patter, RN

## 2018-02-27 ENCOUNTER — Other Ambulatory Visit: Payer: Self-pay | Admitting: Infectious Diseases

## 2018-02-27 DIAGNOSIS — A310 Pulmonary mycobacterial infection: Secondary | ICD-10-CM

## 2018-02-27 NOTE — Telephone Encounter (Signed)
referal ordered.

## 2018-03-09 DIAGNOSIS — M25562 Pain in left knee: Secondary | ICD-10-CM | POA: Diagnosis not present

## 2018-03-09 DIAGNOSIS — M1712 Unilateral primary osteoarthritis, left knee: Secondary | ICD-10-CM | POA: Diagnosis not present

## 2018-03-18 ENCOUNTER — Ambulatory Visit (INDEPENDENT_AMBULATORY_CARE_PROVIDER_SITE_OTHER): Payer: Medicare Other | Admitting: Internal Medicine

## 2018-03-18 ENCOUNTER — Encounter: Payer: Self-pay | Admitting: Internal Medicine

## 2018-03-18 ENCOUNTER — Other Ambulatory Visit (INDEPENDENT_AMBULATORY_CARE_PROVIDER_SITE_OTHER): Payer: Medicare Other

## 2018-03-18 VITALS — BP 108/62 | HR 77 | Ht 63.0 in | Wt 147.0 lb

## 2018-03-18 DIAGNOSIS — J449 Chronic obstructive pulmonary disease, unspecified: Secondary | ICD-10-CM | POA: Diagnosis not present

## 2018-03-18 DIAGNOSIS — J479 Bronchiectasis, uncomplicated: Secondary | ICD-10-CM

## 2018-03-18 DIAGNOSIS — R059 Cough, unspecified: Secondary | ICD-10-CM

## 2018-03-18 DIAGNOSIS — R05 Cough: Secondary | ICD-10-CM | POA: Diagnosis not present

## 2018-03-18 LAB — CBC WITH DIFFERENTIAL/PLATELET
Basophils Absolute: 0 10*3/uL (ref 0.0–0.1)
Basophils Relative: 0.5 % (ref 0.0–3.0)
Eosinophils Absolute: 0.1 10*3/uL (ref 0.0–0.7)
Eosinophils Relative: 1.4 % (ref 0.0–5.0)
HCT: 41.3 % (ref 36.0–46.0)
Hemoglobin: 13.8 g/dL (ref 12.0–15.0)
Lymphocytes Relative: 38.8 % (ref 12.0–46.0)
Lymphs Abs: 3.3 10*3/uL (ref 0.7–4.0)
MCHC: 33.3 g/dL (ref 30.0–36.0)
MCV: 85 fl (ref 78.0–100.0)
Monocytes Absolute: 0.7 10*3/uL (ref 0.1–1.0)
Monocytes Relative: 8.6 % (ref 3.0–12.0)
Neutro Abs: 4.3 10*3/uL (ref 1.4–7.7)
Neutrophils Relative %: 50.7 % (ref 43.0–77.0)
Platelets: 357 10*3/uL (ref 150.0–400.0)
RBC: 4.87 Mil/uL (ref 3.87–5.11)
RDW: 13.7 % (ref 11.5–15.5)
WBC: 8.5 10*3/uL (ref 4.0–10.5)

## 2018-03-18 MED ORDER — BUDESONIDE-FORMOTEROL FUMARATE 160-4.5 MCG/ACT IN AERO: 2.0000 | INHALATION_SPRAY | Freq: Two times a day (BID) | RESPIRATORY_TRACT | 0 refills | Status: DC

## 2018-03-18 MED ORDER — BUDESONIDE-FORMOTEROL FUMARATE 160-4.5 MCG/ACT IN AERO: 2.0000 | INHALATION_SPRAY | Freq: Two times a day (BID) | RESPIRATORY_TRACT | 11 refills | Status: DC

## 2018-03-18 NOTE — Patient Instructions (Addendum)
Symbicort 160 Take 2 puffs first thing in am and then another 2 puffs about 12 hours later.   Work on inhaler technique:  relax and gently blow all the way out then take a nice smooth deep breath back in, triggering the inhaler at same time you start breathing in.  Hold for up to 5 seconds if you can. Blow out thru nose. Rinse and gargle with water when done   Bronchiectasis =   you have scarring of your bronchial tubes which means that they don't function perfectly normally and mucus tends to pool in certain areas of your lung which can cause pneumonia and further scarring of your lung and bronchial tubes  Whenever you develop cough congestion take mucinex or mucinex dm  Up to 1200 mg every 12 hours as needed > these will help keep the mucus loose and flowing but if your condition worsens you need to seek help immediately preferably here or somewhere inside the [facility] system to compare xrays ( worse = darker or bloody mucus or pain on breathing in)   Please remember to go to the lab department downstairs in the basement  for your tests - we will call you with the results when they are available.       Come to outpatient registration at [facility] (behind the ER) at 715 am Friday 03/27/18 with nothing to eat or drink after midnight Thursday.for Bronchoscopy     Please schedule a follow up office visit in 6 weeks, call sooner if needed with pfts on return

## 2018-03-18 NOTE — Progress Notes (Signed)
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 admit then perfectly healthy  X for sensation of pnds onset  around 2009 springtime rx otc ok then abrupt onset  "bad cold" w/in a few days of arriving in [place] late Sept  2018 cough lingered but then rx Zpak in Jan 2019 no better then to [facility] [place]/ June 2019 [place] and [facility] [place] > ent eval [facility] eval June 3536 dx slt sinus infection rx  amox diarhea/ changed to levaquin > almost completely resolved but cxr suggested Lingular atx > [facility] eval with CT chest c/w bronchiectasis 02/02/18 so referred to pulmonary clinic 03/18/2018 by Dr   Johnnye Sima     03/18/2018  Pulmonary/ 1st office eval  Chief Complaint  Patient presents with  . pulmonary consult    referred ny Dr. Johnnye Sima for MAI. patient has SOB on exertion   Dyspnea:  Walking nl pace up [place] slt sob s chest tightness since onset of cough  Cough: gone p levaquin rx  Sleep: side / flat bed x one pillow SABA use: ? Helped not sure   No obvious day to day or daytime variability or assoc excess/ purulent sputum or mucus plugs or hemoptysis or cp or chest tightness, subjective wheeze or overt sinus or hb symptoms.   Sleeps as above  without nocturnal  or early am exacerbation  of respiratory  c/o's or need for noct saba. Also denies any obvious fluctuation of symptoms with weather or environmental changes or other aggravating or alleviating factors except as outlined above   No unusual exposure hx or h/o childhood pna/ asthma or knowledge of premature birth.  Current Allergies, Complete Past Medical History, Past Surgical History, Family History, and Social History were reviewed in [employer] record.  ROS  The following are not active complaints unless bolded Hoarseness, sore throat,  dysphagia, dental problems, itching, sneezing,  nasal congestion or discharge of excess mucus or purulent secretions, ear ache,   fever, chills, sweats, unintended wt loss or wt gain, classically pleuritic or exertional cp,  orthopnea pnd or arm/hand swelling  or leg swelling, presyncope, palpitations, abdominal pain, anorexia, nausea, vomiting, diarrhea  or change in bowel habits or change in bladder habits, change in stools or change in urine, dysuria, hematuria,  rash, arthralgias, visual complaints, headache, numbness, weakness or ataxia or problems with walking or coordination,  change in mood or  memory.              Past Medical History:  Diagnosis Date  . Hyperlipidemia   . Mycobacterium avium complex [facility])     Outpatient Medications Prior to Visit  Medication Sig Dispense Refill  . albuterol (PROVENTIL HFA;VENTOLIN HFA) 108 (90 Base) MCG/ACT inhaler Inhale into the lungs.    . fluticasone (FLONASE) 50 MCG/ACT nasal spray Place into the nose.    . Multiple Vitamin (MULTIVITAMIN) capsule Take 1 capsule by mouth daily.    Marland Kitchen VITAMIN D, CHOLECALCIFEROL, PO Take 600 mg by mouth.                Objective:     BP 108/62 (BP Location: Left Arm, Cuff Size: Normal)   Pulse 77   Ht _0  (1.6 m)   Wt 147 lb (66.7 kg)   SpO2  94%   BMI 26.04 kg/m   SpO2: 94 %  RA  Very pleasant healthy appearing wf nad    HEENT: nl dentition / oropharynx. Nl external ear canals without cough reflex -  Mild bilateral non-specific turbinate edema     NECK :  without JVD/Nodes/TM/ nl carotid upstrokes bilaterally   LUNGS: no acc muscle use,  Mild barrel  contour chest wall with bilateral  Distant bs s audible wheeze and  without cough on insp or exp maneuver and mild  Hyperresonant  to  percussion bilaterally     CV:  RRR  no s3 or murmur or increase in P2, and no edema   ABD:  soft and nontender with pos end  insp Hoover's  in the supine position. No bruits or organomegaly appreciated,  bowel sounds nl  MS:   Nl gait/  ext warm without deformities, calf tenderness, cyanosis or clubbing No obvious joint restrictions   SKIN: warm and dry without lesions    NEURO:  alert, approp, nl sensorium with  no motor or cerebellar deficits apparent.    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   COPD GOLD II Spirometry 03/18/2018  FEV1 1.0 (49%)  Ratio 70 prior to any rx - 03/18/2018  After extensive coaching inhaler device,  effectiveness =    75% try symb 160 2bid then repeat pfts on rx in 6 weeks to be sure she's really a GOLD II and not chronic AB given min smoking hx  - [facility] AT screening  03/18/2018  - Allergy profile 03/18/2018 >  Eos 0.1 /  IgE Pending     She minimizes both her symptoms and smoking hx so important to repeat the full pfts as above as she really doesn't look like a GOLD III pt at this point    Total time devoted to counseling  > 50 % of initial 60 min office visit:  review case with pt/husband discussion of  options/alternatives/ personally creating written customized instructions  in presence of pt  then going over those specific  Instructions directly with the pt including how to use all of the meds but in particular covering each new medication in detail and the difference between the maintenance= "automatic" meds and the prns using an action plan format for the latter (If this problem/symptom => do that organization reading Left to right).  Please see AVS from this visit for a full list of these instructions which I personally wrote for this pt and  are unique to  this visit.       Christinia Gully, MD 03/18/2018

## 2018-03-19 ENCOUNTER — Encounter: Payer: Self-pay | Admitting: Internal Medicine

## 2018-03-19 DIAGNOSIS — J449 Chronic obstructive pulmonary disease, unspecified: Secondary | ICD-10-CM | POA: Insufficient documentation

## 2018-03-19 NOTE — Assessment & Plan Note (Signed)
Spirometry 03/18/2018  FEV1 1.0 (49%)  Ratio 70 prior to any rx - 03/18/2018  After extensive coaching inhaler device,  effectiveness =    75% try symb 160 2bid then repeat pfts on rx in 6 weeks to be sure she's really a GOLD II and not chronic AB given min smoking hx  - [employer] AT screening  03/18/2018  - Allergy profile 03/18/2018 >  Eos 0.1 /  IgE Pending     She minimizes both her symptoms and smoking hx so important to repeat the full pfts as above as she really doesn't look like a GOLD III pt at this point    Total time devoted to counseling  > 50 % of initial 60 min office visit:  review case with pt/husband discussion of options/alternatives/ personally creating written customized instructions  in presence of pt  then going over those specific  Instructions directly with the pt including how to use all of the meds but in particular covering each new medication in detail and the difference between the maintenance= "automatic" meds and the prns using an action plan format for the latter (If this problem/symptom => do that organization reading Left to right).  Please see AVS from this visit for a full list of these instructions which I personally wrote for this pt and  are unique to this visit.

## 2018-03-19 NOTE — Assessment & Plan Note (Addendum)
Onset of cough Sept 2019  - CT chest 02/02/18  1. 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

## 2018-03-23 LAB — RESPIRATORY ALLERGY PROFILE REGION II ~~LOC~~

## 2018-03-23 LAB — ALPHA-1 ANTITRYPSIN PHENOTYPE: A-1 Antitrypsin, Ser: 128 mg/dL (ref 83–199)

## 2018-03-23 LAB — INTERPRETATION:

## 2018-03-23 LAB — IGG, IGA, IGM
IgG (Immunoglobin G), Serum: 1002 mg/dL (ref 600–1540)
IgM, Serum: 77 mg/dL (ref 50–300)
Immunoglobulin A: 315 mg/dL (ref 20–320)

## 2018-03-24 NOTE — Progress Notes (Signed)
Spoke with pt and notified of results per Dr. Wert. Pt verbalized understanding and denied any questions. 

## 2018-03-27 ENCOUNTER — Encounter (HOSPITAL_COMMUNITY): Payer: Self-pay | Admitting: Respiratory Therapy

## 2018-03-27 ENCOUNTER — Ambulatory Visit (HOSPITAL_COMMUNITY)
Admission: RE | Admit: 2018-03-27 | Discharge: 2018-03-27 | Disposition: A | Discharge status: Home / Self Care | Payer: Medicare Other | Source: Ambulatory Visit | Attending: Internal Medicine | Admitting: Internal Medicine

## 2018-03-27 ENCOUNTER — Encounter (HOSPITAL_COMMUNITY)
Admission: RE | Disposition: A | Discharge status: Home / Self Care | Payer: Self-pay | Source: Ambulatory Visit | Attending: Internal Medicine

## 2018-03-27 DIAGNOSIS — Z87891 Personal history of nicotine dependence: Secondary | ICD-10-CM | POA: Diagnosis not present

## 2018-03-27 DIAGNOSIS — Z79899 Other long term (current) drug therapy: Secondary | ICD-10-CM | POA: Insufficient documentation

## 2018-03-27 DIAGNOSIS — E785 Hyperlipidemia, unspecified: Secondary | ICD-10-CM | POA: Insufficient documentation

## 2018-03-27 DIAGNOSIS — J479 Bronchiectasis, uncomplicated: Secondary | ICD-10-CM | POA: Insufficient documentation

## 2018-03-27 HISTORY — PX: VIDEO BRONCHOSCOPY: SHX5072

## 2018-03-27 SURGERY — VIDEO BRONCHOSCOPY WITHOUT FLUORO
Anesthesia: Moderate Sedation | Laterality: Bilateral

## 2018-03-27 MED ORDER — PHENYLEPHRINE HCL 0.25 % NA SOLN
NASAL | Status: DC | PRN
  Administered 2018-03-27: 2 via NASAL

## 2018-03-27 MED ORDER — LIDOCAINE HCL URETHRAL/MUCOSAL 2 % EX GEL
CUTANEOUS | Status: DC | PRN
  Administered 2018-03-27: 1

## 2018-03-27 MED ORDER — MEPERIDINE HCL 25 MG/ML IJ SOLN
INTRAMUSCULAR | Status: DC | PRN
  Administered 2018-03-27 (×2): 25 mg via INTRAVENOUS

## 2018-03-27 MED ORDER — MEPERIDINE HCL 100 MG/ML IJ SOLN: 100.0000 mg | Freq: Once | INTRAMUSCULAR | Status: DC

## 2018-03-27 MED ORDER — LIDOCAINE HCL 2 % EX GEL: 1.0000 "application " | Freq: Once | CUTANEOUS | Status: DC

## 2018-03-27 MED ORDER — MIDAZOLAM HCL 5 MG/ML IJ SOLN: 1.0000 mg | Freq: Once | INTRAMUSCULAR | Status: DC

## 2018-03-27 MED ORDER — MIDAZOLAM HCL 5 MG/ML IJ SOLN
INTRAMUSCULAR | Status: AC
  Filled 2018-03-27: qty 2

## 2018-03-27 MED ORDER — MEPERIDINE HCL 100 MG/ML IJ SOLN
INTRAMUSCULAR | Status: AC
  Filled 2018-03-27: qty 2

## 2018-03-27 MED ORDER — MIDAZOLAM HCL 10 MG/2ML IJ SOLN
INTRAMUSCULAR | Status: DC | PRN
  Administered 2018-03-27: 2.5 mg via INTRAVENOUS

## 2018-03-27 MED ORDER — LIDOCAINE HCL 1 % IJ SOLN
INTRAMUSCULAR | Status: DC | PRN
  Administered 2018-03-27: 6 mL via RESPIRATORY_TRACT

## 2018-03-27 MED ORDER — PHENYLEPHRINE HCL 0.25 % NA SOLN: 1.0000 | Freq: Four times a day (QID) | NASAL | Status: DC | PRN

## 2018-03-27 MED ORDER — SODIUM CHLORIDE 0.9 % IV SOLN
INTRAVENOUS | Status: DC
  Administered 2018-03-27: 08:00:00 via INTRAVENOUS

## 2018-03-27 NOTE — H&P (Signed)
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 then perfectly healthy  X for sensation of pnds onset  around 2009 [facility] rx otc ok then abrupt onset  "bad cold" w/in a few days of arriving in [place] late Sept  2018 cough lingered but then rx Zpak in Jan 2019 no better then to [facility]/ June 2019 [facility] and [facility] [place] > ent eval [facility] eval June 4287 dx slt sinus infection rx  amox diarhea/ changed to levaquin > almost completely resolved but cxr suggested Lingular atx > [facility] eval with CT chest c/w bronchiectasis 02/02/18 so referred to pulmonary clinic 03/18/2018 by Dr   Johnnye Sima     03/18/2018  Pulmonary/ 1st office eval      Chief Complaint  Patient presents with  . pulmonary consult    referred ny Dr. Johnnye Sima for MAI. patient has SOB on exertion   Dyspnea:  Walking nl pace [place] slt sob s chest tightness since onset of cough  Cough: gone p levaquin rx  Sleep: side / flat bed x one pillow SABA use: ? Helped not sure   No obvious day to day or daytime variability or assoc excess/ purulent sputum or mucus plugs or hemoptysis or cp or chest tightness, subjective wheeze or overt sinus or hb symptoms.   Sleeps as above  without nocturnal  or early am exacerbation  of respiratory  c/o's or need for noct saba. Also denies any obvious fluctuation of symptoms with weather or environmental changes or other aggravating or alleviating factors except as outlined above   No unusual exposure hx or h/o childhood pna/ asthma or knowledge of premature birth.  Current Allergies, Complete Past Medical History, Past Surgical History, Family History, and Social History were reviewed in [employer] record.  ROS  The following are not active complaints unless bolded Hoarseness,  sore throat, dysphagia, dental problems, itching, sneezing,  nasal congestion or discharge of excess mucus or purulent secretions, ear ache,   fever, chills, sweats, unintended wt loss or wt gain, classically pleuritic or exertional cp,  orthopnea pnd or arm/hand swelling  or leg swelling, presyncope, palpitations, abdominal pain, anorexia, nausea, vomiting, diarrhea  or change in bowel habits or change in bladder habits, change in stools or change in urine, dysuria, hematuria,  rash, arthralgias, visual complaints, headache, numbness, weakness or ataxia or problems with walking or coordination,  change in mood or  memory.                  Past Medical History:  Diagnosis Date  . Hyperlipidemia   . 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 albuterol (PROVENTIL HFA;VENTOLIN HFA) 108 (90 Base) MCG/ACT inhaler Inhale into the lungs.    . fluticasone (FLONASE) 50 MCG/ACT nasal spray Place into the nose.    . Multiple Vitamin (MULTIVITAMIN) capsule Take 1 capsule by mouth daily.    Marland Kitchen VITAMIN D, CHOLECALCIFEROL, PO Take 600 mg by mouth.                Objective:   BP 108/62 (BP Location: Left Arm, Cuff Size: Normal)   Pulse 77   Ht _0  (  1.6 m)   Wt 147 lb (66.7 kg)   SpO2 94%   BMI 26.04 kg/m   SpO2: 94 %  RA  Very pleasant healthy appearing wf nad    HEENT: nl dentition / oropharynx. Nl external ear canals without cough reflex -  Mild bilateral non-specific turbinate edema     NECK :  without JVD/Nodes/TM/ nl carotid upstrokes bilaterally   LUNGS: no acc muscle use,  Mild barrel  contour chest wall with bilateral  Distant bs s audible wheeze and  without cough on insp or exp maneuver and mild  Hyperresonant  to  percussion bilaterally     CV:  RRR  no s3 or murmur or increase in P2, and no edema   ABD:  soft and nontender with pos end  insp Hoover's  in the  supine position. No bruits or organomegaly appreciated, bowel sounds nl  MS:   Nl gait/  ext warm without deformities, calf tenderness, cyanosis or clubbing No obvious joint restrictions   SKIN: warm and dry without lesions    NEURO:  alert, approp, nl sensorium with  no motor or cerebellar deficits apparent.    Ct chest 02/02/18 reviewed see bronchiectasis a/p  Labs ordered 03/18/2018   Alpha one AT screen, Quant Ig's, allergy profile         Assessment    COPD GOLD II Spirometry 03/18/2018  FEV1 1.0 (49%)  Ratio 70 prior to any rx - 03/18/2018  After extensive coaching inhaler device,  effectiveness =    75% try symb 160 2bid then repeat pfts on rx in 6 weeks to be sure she's really a GOLD II and not chronic AB given min smoking hx  - [employer] AT screening  03/18/2018  - Allergy profile 03/18/2018 >  Eos 0.1 /  IgE Pending     She minimizes both her symptoms and smoking hx so important to repeat the full pfts as above as she really doesn't look like a GOLD III pt at this point      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      03/27/2018 day of FOB/ no change in hx or  exam.   Christinia Gully, MD Pulmonary and [place] [facility] [phone] After 5:30 PM or weekends, use [facility] [phone]

## 2018-03-27 NOTE — Discharge Instructions (Signed)
Flexible Bronchoscopy, Care After These instructions give you information on caring for yourself after your procedure. Your doctor may also give you more specific instructions. Call your doctor if you have any problems or questions after your procedure. Follow these instructions at home:  Do not eat or drink anything for 2 hours after your procedure. If you try to eat or drink before the medicine wears off, food or drink could go into your lungs. You could also burn yourself.  After 2 hours have passed and when you can cough and gag normally, you may eat soft food and drink liquids slowly.  The day after the test, you may eat your normal diet.  You may do your normal activities.  Keep all doctor visits. Get help right away if:  You get more and more short of breath.  You get light-headed.  You feel like you are going to pass out (faint).  You have chest pain.  You have new problems that worry you.  You cough up more than a little blood.  You cough up more blood than before. This information is not intended to replace advice given to you by your health care provider. Make sure you discuss any questions you have with your health care provider. Document Released: 03/31/2009 Document Revised: 11/09/2015 Document Reviewed: 02/05/2013 Elsevier Interactive Patient Education  2017 [employer].  Nothing to eat or drink until    10:00   am today 03/27/2018 Any questions or concerns call the office at [phone]

## 2018-03-27 NOTE — Op Note (Addendum)
Bronchoscopy Procedure Note  Date of Operation: 03/27/2018   Pre-op Diagnosis: bronchiectasis ? mai  Post-op Diagnosis: same  Surgeon: Christinia Gully  Anesthesia: Monitored Local Anesthesia with Sedation Time Started: 0751 total of versed 2.5 mg IV   And demerol 50 mg IV  Time Stopped:  0810  Operation: Video Flexible fiberoptic bronchoscopy, diagnostic   Findings: minimal mp secretions  Specimen: BAL   Estimated Blood Loss: none  Complications: none  Indications and History: See updated H and P same date. The risks, benefits, complications, treatment options and expected outcomes were discussed with the patient.  The possibilities of reaction to medication, pulmonary aspiration, perforation of a viscus, bleeding, failure to diagnose a condition and creating a complication requiring transfusion or operation were discussed with the patient who freely signed the consent.    Description of Procedure: The patient was re-examined in the bronchoscopy suite and the site of surgery properly noted/marked.  The patient was identified  and the procedure verified as Flexible Fiberoptic Bronchoscopy.  A Time Out was held and the above information confirmed.   After the induction of topical nasopharyngeal anesthesia, the patient was positioned  and the bronchoscope was passed through the R naris. The vocal cords were visualized and  1% buffered lidocaine 5 ml was topically placed onto the cords. The cords were nl. The scope was then passed into the trachea.  1% buffered lidocaine given topically. Airways inspected bilaterally to the subsegmental level with the following findings:   All airways opened widely to the subsegmental level with scattered mucopurulent but minimal secretions easily cleared from airway    Interventions: BAL Lingula : good return and sent for afb, fungus, routine cultures and cytology     The Patient was taken to the Endoscopy Recovery area in satisfactory  condition.  Attestation: I performed the procedure.  Christinia Gully, MD Pulmonary and [place] [phone] After 5:30 PM or weekends, call [phone]

## 2018-03-27 NOTE — Progress Notes (Signed)
Video Bronchoscopy done Intervention Bronchial washing done Procedure tolerated well 

## 2018-03-28 ENCOUNTER — Encounter (HOSPITAL_COMMUNITY): Payer: Self-pay | Admitting: Internal Medicine

## 2018-03-28 LAB — ACID FAST SMEAR (AFB, MYCOBACTERIA): Acid Fast Smear: NEGATIVE

## 2018-03-29 LAB — CULTURE, RESPIRATORY W GRAM STAIN
Culture: NORMAL
Special Requests: NORMAL

## 2018-03-31 ENCOUNTER — Telehealth: Payer: Self-pay | Admitting: Internal Medicine

## 2018-03-31 NOTE — Telephone Encounter (Signed)
Called and spoke with patient she is aware nothing further needed.  

## 2018-03-31 NOTE — Telephone Encounter (Signed)
Called and spoke with pt who wants to know the results of the bronchoscopy. Pt stated she received the results via mychart but does not know how to read the results and wants an explanation of them.  Dr. Melvyn Novas, please advise on this for pt. Thanks!

## 2018-03-31 NOTE — Progress Notes (Signed)
LMTCB

## 2018-03-31 NOTE — Telephone Encounter (Signed)
See result note not called in yet Nl study, awaiting cultures

## 2018-04-06 ENCOUNTER — Ambulatory Visit: Payer: Medicare Other | Admitting: Infectious Diseases

## 2018-04-09 LAB — MYCOBACTERIA,CULT W/FLUOROCHROME SMEAR
MICRO NUMBER:: 91067321
SMEAR:: NONE SEEN
SPECIMEN QUALITY:: ADEQUATE

## 2018-04-17 ENCOUNTER — Telehealth: Payer: Self-pay | Admitting: Internal Medicine

## 2018-04-17 NOTE — Telephone Encounter (Signed)
Return call to Patient.  Patient was checking on update on recent bronchoscopy.  Read to her what last result note stated. Notes recorded by Tanda Rockers, MD on 03/31/2018 at 1:54 PM EDT Call patient : Studies are unremarkable so far but the cultures will take a few more weeks to come back and we'll update her then Patient stated understanding.  Nothing further at this time.

## 2018-04-29 LAB — FUNGAL ORGANISM REFLEX

## 2018-04-29 LAB — FUNGUS CULTURE WITH STAIN

## 2018-04-29 LAB — FUNGUS CULTURE RESULT

## 2018-05-01 ENCOUNTER — Ambulatory Visit: Payer: Medicare Other | Admitting: Internal Medicine

## 2018-05-07 ENCOUNTER — Other Ambulatory Visit: Payer: Self-pay | Admitting: Internal Medicine

## 2018-05-07 DIAGNOSIS — J449 Chronic obstructive pulmonary disease, unspecified: Secondary | ICD-10-CM

## 2018-05-08 ENCOUNTER — Encounter: Payer: Self-pay | Admitting: Internal Medicine

## 2018-05-08 ENCOUNTER — Ambulatory Visit (INDEPENDENT_AMBULATORY_CARE_PROVIDER_SITE_OTHER): Payer: Medicare Other | Admitting: Internal Medicine

## 2018-05-08 VITALS — BP 108/64 | HR 69 | Ht 62.0 in | Wt 146.0 lb

## 2018-05-08 DIAGNOSIS — J479 Bronchiectasis, uncomplicated: Secondary | ICD-10-CM | POA: Diagnosis not present

## 2018-05-08 DIAGNOSIS — J449 Chronic obstructive pulmonary disease, unspecified: Secondary | ICD-10-CM

## 2018-05-08 LAB — PULMONARY FUNCTION TEST
DL/VA % pred: 91 %
DL/VA: 4.16 ml/min/mmHg/L
DLCO cor % pred: 67 %
DLCO cor: 14.45 ml/min/mmHg
DLCO unc % pred: 67 %
DLCO unc: 14.62 ml/min/mmHg
FEF 25-75 Post: 0.84 L/sec
FEF 25-75 Pre: 0.56 L/sec
FEF2575-%Change-Post: 50 %
FEF2575-%Pred-Post: 50 %
FEF2575-%Pred-Pre: 33 %
FEV1-%Change-Post: 14 %
FEV1-%Pred-Post: 65 %
FEV1-%Pred-Pre: 57 %
FEV1-Post: 1.3 L
FEV1-Pre: 1.13 L
FEV1FVC-%Change-Post: 11 %
FEV1FVC-%Pred-Pre: 86 %
FEV6-%Change-Post: 2 %
FEV6-%Pred-Post: 70 %
FEV6-%Pred-Pre: 68 %
FEV6-Post: 1.78 L
FEV6-Pre: 1.73 L
FEV6FVC-%Change-Post: 0 %
FEV6FVC-%Pred-Post: 105 %
FEV6FVC-%Pred-Pre: 104 %
FVC-%Change-Post: 2 %
FVC-%Pred-Post: 67 %
FVC-%Pred-Pre: 65 %
FVC-Post: 1.78 L
FVC-Pre: 1.74 L
Post FEV1/FVC ratio: 73 %
Post FEV6/FVC ratio: 100 %
Pre FEV1/FVC ratio: 65 %
Pre FEV6/FVC Ratio: 100 %
RV % pred: 99 %
RV: 2.14 L
TLC % pred: 82 %
TLC: 3.9 L

## 2018-05-08 MED ORDER — BUDESONIDE-FORMOTEROL FUMARATE 80-4.5 MCG/ACT IN AERO: 2.0000 | INHALATION_SPRAY | Freq: Two times a day (BID) | RESPIRATORY_TRACT | 11 refills | Status: DC

## 2018-05-08 MED ORDER — BUDESONIDE-FORMOTEROL FUMARATE 80-4.5 MCG/ACT IN AERO: 2.0000 | INHALATION_SPRAY | Freq: Two times a day (BID) | RESPIRATORY_TRACT | 0 refills | Status: DC

## 2018-05-08 NOTE — Progress Notes (Signed)
PFT done today. 

## 2018-05-08 NOTE — Patient Instructions (Signed)
Symbicort 80 take 2 puffs first thing in am then the pm doses are optional   Work on inhaler technique:  relax and gently blow all the way out then take a nice smooth deep breath back in, triggering the inhaler at same time you start breathing in.  Hold for up to 5 seconds if you can. Blow out thru nose. Rinse and gargle with water when done  I will call when the final cultures are available      Please schedule a follow up visit in 3 months but call sooner if needed

## 2018-05-08 NOTE — Progress Notes (Signed)
Tracey Castro, female    DOB: 10-24-1945,   MRN: [id number]     Brief patient profile:  33  yowf "never regular smoker" worked as Astronomer grew up in  [place] with pna age 72 did not req admit then perfectly healthy  X for sensation of pnds onset  around 2009 [facility] rx otc ok then abrupt onset  "bad cold" w/in a few days of arriving in [place] late Sept  2018 cough lingered but then rx Zpak in Jan 2019 no better then to [facility]/ June 2019 drove and [facility] [place] [facility] [place] > ent eval [facility] eval June 6389 dx slt sinus infection rx  amox diarhea/ changed to levaquin > almost completely resolved but cxr suggested Lingular atx > [facility] eval with CT chest c/w bronchiectasis 02/02/18 so referred to pulmonary clinic 03/18/2018 by Dr   Johnnye Sima    History of Present Illness  03/18/2018  Pulmonary/ 1st office eval  Chief Complaint  Patient presents with  . pulmonary consult    referred ny Dr. Johnnye Sima for MAI. patient has SOB on exertion   Dyspnea:  Walking nl pace up hills slt sob s chest tightness since onset of cough  Cough: gone p levaquin rx  Sleep: side / flat bed x one pillow SABA use: ? Helped not sure rec Symbicort 160 Take 2 puffs first thing in am and then another 2 puffs about 12 hours later.  Work on inhaler technique Bronchiectasis =   you have scarring of your bronchial tubes   W/u:  - Quant TB  02/19/18 neg  - Quant Ig's 03/18/2018 nl  - Alpha one AT screen  03/18/18  MM, level 128  - FOB 03/27/2018   Minimal MP secrections>>>   BAL lingula >>>  Neg cyt,  Neg afb smear >>> cultures pending    05/08/2018  f/u ov/Tighe Gitto re: bronchiectasis with reversible airflow on pfts 05/08/2018 on symb 160 2bid  Chief Complaint  Patient presents with  . Follow-up    PFT's done today. She states she is getting over a sinus infection- txed with levaquin per ENT. She states her breathing has improved some.    Dyspnea:  MMRC1 = can walk nl pace, flat grade, can't  hurry or go uphills or steps s sob  Cough: improved Sleeping: one pillow no flares  SABA use: none 02: none     No obvious day to day or daytime variability or assoc excess/ purulent sputum or mucus plugs or hemoptysis or cp or chest tightness, subjective wheeze or overt sinus or hb symptoms.   Sleeping as above without nocturnal  or early am exacerbation  of respiratory  c/o's or need for noct saba. Also denies any obvious fluctuation of symptoms with weather or environmental changes or other aggravating or alleviating factors except as outlined above   No unusual exposure hx or h/o childhood pna/ asthma or knowledge of premature birth.  Current Allergies, Complete Past Medical History, Past Surgical History, Family History, and Social History were reviewed in [employer] record.  ROS  The following are not active complaints unless bolded Hoarseness, sore throat, dysphagia, dental problems, itching, sneezing,  nasal congestion or discharge of excess mucus or purulent secretions, ear ache,   fever, chills, sweats, unintended wt loss or wt gain, classically pleuritic or exertional cp,  orthopnea pnd or arm/hand swelling  or leg swelling, presyncope, palpitations, abdominal pain, anorexia, nausea, vomiting, diarrhea  or change in bowel habits  or change in bladder habits, change in stools or change in urine, dysuria, hematuria,  rash, arthralgias, visual complaints, headache, numbness, weakness or ataxia or problems with walking or coordination,  change in mood or  memory.        Current Meds  Medication Sig  . Cholecalciferol (VITAMIN D) 2000 units tablet Take 4,000-6,000 Units by mouth daily.  Marland Kitchen doxylamine, Sleep, (UNISOM) 25 MG tablet Take 12.5 mg by mouth at bedtime as needed for sleep.  . fluticasone (FLONASE) 50 MCG/ACT nasal spray Place 1 spray into the nose daily as needed for allergies.   Marland Kitchen ibuprofen (ADVIL,MOTRIN) 200 MG tablet Take 400 mg by mouth 2 (two) times  daily as needed for headache or moderate pain.  . Multiple Vitamin (MULTIVITAMIN) capsule Take 1 capsule by mouth daily.  . vitamin B-12 (CYANOCOBALAMIN) 1000 MCG tablet Take 1,000 mcg by mouth daily.  . [DISCONTINUED] budesonide-formoterol (SYMBICORT) 160-4.5 MCG/ACT inhaler Inhale 2 puffs into the lungs 2 (two) times daily. (Patient taking differently: Inhale 1 puff into the lungs 2 (two) times daily. )                 Objective:      Pleasant amb wf nad  Wt Readings from Last 3 Encounters:  05/08/18 146 lb (66.2 kg)  03/18/18 147 lb (66.7 kg)  02/19/18 143 lb 6.4 oz (65 kg)     Vital signs reviewed - Note on arrival 02 sats  99% on RA     HEENT: nl dentition, and oropharynx. Nl external ear canals without cough reflex - mild bilateral non-specific turbinate edema     NECK :  without JVD/Nodes/TM/ nl carotid upstrokes bilaterally   LUNGS: no acc muscle use,  Nl contour chest which is clear to A and P bilaterally without cough on insp or exp maneuvers   CV:  RRR  no s3 or murmur or increase in P2, and no edema   ABD:  soft and nontender with nl inspiratory excursion in the supine position. No bruits or organomegaly appreciated, bowel sounds nl  MS:  Nl gait/ ext warm without deformities, calf tenderness, cyanosis or clubbing No obvious joint restrictions   SKIN: warm and dry without lesions    NEURO:  alert, approp, nl sensorium with  no motor or cerebellar deficits apparent.        Assessment

## 2018-05-09 ENCOUNTER — Encounter: Payer: Self-pay | Admitting: Internal Medicine

## 2018-05-09 NOTE — Assessment & Plan Note (Signed)
Onset of cough Sept 2019  - CT chest 02/02/18  1. 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 - Quant TB  02/19/18 neg  - Quant Ig's 03/18/2018 nl  - Alpha one AT screen  03/18/18  MM, level 128  - FOB 03/27/2018   Minimal MP secrections>>>   BAL lingula >>>  Neg cyt,  Neg afb smear >>>    Await final cultures but doubt significant mai involvement here - may be that most of her symptoms are related to chronic sinusitis and the ct findings are separate findings and normally would not require specific separate rx - though will be hard to tease out, treatment with abx can be the same, perhaps with directed abx if producing any purulent mucus (which she as not at the time of fob)   F/u sinus dz with wolicki/ f/u here in 3 months

## 2018-05-09 NOTE — Assessment & Plan Note (Signed)
Spirometry 03/18/2018  FEV1 1.0 (49%)  Ratio 70 prior to any rx - 03/18/2018  After extensive coaching inhaler device,  effectiveness =    75% try symb 160 2bid then repeat pfts on rx in 6 weeks to be sure she's really a GOLD II and not chronic AB given min smoking hx  - Alpha one AT screen  03/18/18  MM, level 128  - Allergy profile 03/18/2018 >  Eos 0.1 /  IgE 4 RAST neg    - PFT's  05/08/2018  FEV1 1.30 (65 % ) ratio 73  p 14 % improvement from saba p nothing prior to study with DLCO  67 % corrects to 91  % for alv volume  - significant curvature even p saba so rec reduce symb to 80 2bid  - 05/08/2018  After extensive coaching inhaler device,  effectiveness =    75% from baseline < 50% (too short on ti)   She has more of a chronic asthma than copd pattern on copd with normalization of ratio p saba so can be treated as AB with lower dose of ics which may reduce the risk of infection related to bronchiectasis.    I had an extended discussion with the patient and husband reviewing all relevant studies completed to date and  lasting 15 to 20 minutes of a 25 minute visit    See device teaching which extended face to face time for this visit.  Each maintenance medication was reviewed in detail including emphasizing most importantly the difference between maintenance and prns and under what circumstances the prns are to be triggered using an action plan format that is not reflected in the computer generated alphabetically organized AVS which I have not found useful in most complex patients, especially with respiratory illnesses  Please see AVS for specific instructions unique to this visit that I personally wrote and verbalized to the the pt in detail and then reviewed with pt  by my nurse highlighting any  changes in therapy recommended at today's visit to their plan of care.

## 2018-05-10 LAB — ACID FAST CULTURE WITH REFLEXED SENSITIVITIES (MYCOBACTERIA): Acid Fast Culture: NEGATIVE

## 2018-05-21 ENCOUNTER — Telehealth: Payer: Self-pay | Admitting: Internal Medicine

## 2018-05-21 NOTE — Telephone Encounter (Signed)
Called and spoke with Patient.  Results and recommendations from Dr. Melvyn Novas given.  Understanding stated.  Nothing further at this time.

## 2018-05-21 NOTE — Telephone Encounter (Signed)
Called and spoke to patient, requesting results of cultures from [facility] done 10/11.   MW please advise. Thanks.

## 2018-05-21 NOTE — Telephone Encounter (Signed)
They came back in piecemeal and I didn't realize they were all final so I appologize for the delay but they are all nl  And we will review them all together on her f/u /ov

## 2018-06-03 ENCOUNTER — Ambulatory Visit (INDEPENDENT_AMBULATORY_CARE_PROVIDER_SITE_OTHER): Payer: Medicare Other

## 2018-06-03 ENCOUNTER — Ambulatory Visit (INDEPENDENT_AMBULATORY_CARE_PROVIDER_SITE_OTHER): Payer: Medicare Other | Admitting: Internal Medicine

## 2018-06-03 ENCOUNTER — Other Ambulatory Visit: Payer: Self-pay | Admitting: Internal Medicine

## 2018-06-03 ENCOUNTER — Encounter: Payer: Self-pay | Admitting: Internal Medicine

## 2018-06-03 VITALS — BP 116/78 | HR 75 | Temp 98.2°F | Ht 62.0 in | Wt 146.0 lb

## 2018-06-03 VITALS — BP 116/78 | HR 75 | Temp 98.2°F | Ht 62.0 in | Wt 146.4 lb

## 2018-06-03 DIAGNOSIS — Z712 Person consulting for explanation of examination or test findings: Secondary | ICD-10-CM | POA: Diagnosis not present

## 2018-06-03 DIAGNOSIS — J479 Bronchiectasis, uncomplicated: Secondary | ICD-10-CM | POA: Diagnosis not present

## 2018-06-03 DIAGNOSIS — Z0000 Encounter for general adult medical examination without abnormal findings: Secondary | ICD-10-CM | POA: Diagnosis not present

## 2018-06-03 DIAGNOSIS — Z23 Encounter for immunization: Secondary | ICD-10-CM

## 2018-06-03 DIAGNOSIS — J449 Chronic obstructive pulmonary disease, unspecified: Secondary | ICD-10-CM

## 2018-06-03 DIAGNOSIS — E785 Hyperlipidemia, unspecified: Secondary | ICD-10-CM | POA: Diagnosis not present

## 2018-06-03 DIAGNOSIS — R7309 Other abnormal glucose: Secondary | ICD-10-CM

## 2018-06-03 LAB — POCT URINALYSIS DIPSTICK
Bilirubin, UA: NEGATIVE
Blood, UA: NEGATIVE
Glucose, UA: NEGATIVE
Ketones, UA: NEGATIVE
Leukocytes, UA: NEGATIVE
Nitrite, UA: NEGATIVE
Protein, UA: NEGATIVE
Spec Grav, UA: 1.01 (ref 1.010–1.025)
Urobilinogen, UA: 0.2 E.U./dL
pH, UA: 6 (ref 5.0–8.0)

## 2018-06-03 MED ORDER — LEVOFLOXACIN 500 MG PO TABS: 500.0000 mg | ORAL_TABLET | Freq: Every day | ORAL | 0 refills | Status: AC

## 2018-06-03 NOTE — Progress Notes (Signed)
Subjective:   Diyari Cherne is a 72 y.o. female who presents for Medicare Annual (Subsequent) preventive examination.  Review of Systems:  n/a       Objective:     Vitals: BP 116/78 (Patient Position: Sitting)   Pulse 75   Temp 98.2 F (36.8 C) (Oral)   Ht _0  (1.575 m)   Wt 146 lb (66.2 kg)   BMI 26.70 kg/m   Body mass index is 26.7 kg/m.  Advanced Directives 06/03/2018 03/27/2018  Does Patient Have a Medical Advance Directive? Yes Yes  Type of Paramedic of [place];Living will Living will  Does patient want to make changes to medical advance directive? No - Patient declined -  Copy of [place] in Chart? No - copy requested -    Tobacco Social History   Tobacco Use  Smoking Status Never Smoker  Smokeless Tobacco Never Used     Counseling given: Not Answered   Clinical Intake:  Pre-visit preparation completed: Yes  Pain : No/denies pain Pain Score: 0-No pain     Nutritional Status: BMI 25 -29 Overweight Nutritional Risks: None Diabetes: No  How often do you need to have someone help you when you read instructions, pamphlets, or other written materials from your doctor or pharmacy?: 1 - Never What is the last grade level you completed in school?: Master degree     Information entered by :: NAllen LPN  Past Medical History:  Diagnosis Date  . Hyperlipidemia   . Mycobacterium avium complex [facility])    Past Surgical History:  Procedure Laterality Date  . BREAST CYST ASPIRATION    . VIDEO BRONCHOSCOPY Bilateral 03/27/2018   Procedure: VIDEO BRONCHOSCOPY WITHOUT FLUORO;  Surgeon: Tanda Rockers, MD;  Location: [facility];  Service: Cardiopulmonary;  Laterality: Bilateral;   Family History  Problem Relation Age of Onset  . Cancer - Ovarian Mother   . Cancer - Colon Father   . Parkinson's disease Father    Social History   Socioeconomic History  . Marital status: Married    Spouse name:  Not on file  . Number of children: Not on file  . Years of education: Not on file  . Highest education level: Not on file  Occupational History  . Occupation: retired  Scientific laboratory technician  . Financial resource strain: Not hard at all  . Food insecurity:    Worry: Never true    Inability: Never true  . Transportation needs:    Medical: No    Non-medical: No  Tobacco Use  . Smoking status: Never Smoker  . Smokeless tobacco: Never Used  Substance and Sexual Activity  . Alcohol use: Yes    Alcohol/week: 1.0 standard drinks    Types: 1 Glasses of wine per week  . Drug use: Never  . Sexual activity: Not Currently  Lifestyle  . Physical activity:    Days per week: 3 days    Minutes per session: 60 min  . Stress: Not at all  Relationships  . Social connections:    Talks on phone: Not on file    Gets together: Not on file    Attends religious service: Not on file    Active member of club or organization: Not on file    Attends meetings of clubs or organizations: Not on file    Relationship status: Married  Other Topics Concern  . Not on file  Social History Narrative  . Not on file  Outpatient Encounter Medications as of 06/03/2018  Medication Sig  . budesonide-formoterol (SYMBICORT) 80-4.5 MCG/ACT inhaler Inhale 2 puffs into the lungs 2 (two) times daily.  . Cholecalciferol (VITAMIN D) 2000 units tablet Take 4,000-6,000 Units by mouth daily.  Marland Kitchen doxylamine, Sleep, (UNISOM) 25 MG tablet Take 12.5 mg by mouth at bedtime as needed for sleep.  . fluticasone (FLONASE) 50 MCG/ACT nasal spray Place 1 spray into the nose daily as needed for allergies.   Marland Kitchen ibuprofen (ADVIL,MOTRIN) 200 MG tablet Take 400 mg by mouth 2 (two) times daily as needed for headache or moderate pain.  . Multiple Vitamin (MULTIVITAMIN) capsule Take 1 capsule by mouth daily.  . vitamin B-12 (CYANOCOBALAMIN) 1000 MCG tablet Take 1,000 mcg by mouth daily.   No facility-administered encounter medications on file as of  06/03/2018.     Activities of Daily Living In your present state of health, do you have any difficulty performing the following activities: 06/03/2018  Hearing? Y  Comment some times tv louder  Vision? N  Difficulty concentrating or making decisions? N  Walking or climbing stairs? Y  Comment having breathing difficulty at this time  Dressing or bathing? N  Doing errands, shopping? N  Preparing Food and eating ? N  Using the Toilet? N  In the past six months, have you accidently leaked urine? Y  Comment occ. with laughter  Do you have problems with loss of bowel control? N  Managing your Medications? N  Managing your Finances? N  Housekeeping or managing your Housekeeping? N  Some recent data might be hidden    Patient Care Team: Glendale Chard, MD as PCP - General (Internal Medicine)    Assessment:   This is a routine wellness examination for Sherrod.  Exercise Activities and Dietary recommendations Current Exercise Habits: Home exercise routine, Type of exercise: walking, Time (Minutes): 60, Frequency (Times/Week): 3, Weekly Exercise (Minutes/Week): 180, Intensity: Moderate  Goals    . exercise regularly (pt-stated)    . Weight (lb) < 200 lb (90.7 kg) (pt-stated)     Would like to lose 5 pounds       Fall Risk Fall Risk  06/03/2018 06/03/2018 02/19/2018 01/06/2017  Falls in the past year? 0 0 No No  Comment - - - Emmi Telephone Survey: data to providers prior to load   Is the patient's home free of loose throw rugs in walkways, pet beds, electrical cords, etc?   yes      Grab bars in the bathroom? no      Handrails on the stairs?   yes      Adequate lighting?   yes  Timed Get Up and Go performed: n/a  Depression Screen PHQ 2/9 Scores 06/03/2018 06/03/2018 02/19/2018  PHQ - 2 Score 0 0 0     Cognitive Function        Immunization History  Administered Date(s) Administered  . Influenza, High Dose Seasonal PF 06/03/2018  . Influenza-Unspecified 04/17/2013     Qualifies for Shingles Vaccine? yes  Screening Tests Health Maintenance  Topic Date Due  . Hepatitis C Screening  07/06/1945  . PNA vac Low Risk Adult (1 of 2 - PCV13) 12/22/2009  . MAMMOGRAM  07/23/2019  . COLONOSCOPY  02/27/2023  . TETANUS/TDAP  02/27/2023  . INFLUENZA VACCINE  Completed  . DEXA SCAN  Completed    Cancer Screenings: Lung: Low Dose CT Chest recommended if Age 46-80 years, 30 pack-year currently smoking OR have quit w/in 15years. Patient does not  qualify. Breast:  Up to date on Mammogram? Yes   Up to date of Bone Density/Dexa? Yes Colorectal: up to date  Additional Screenings: : Hepatitis C Screening: due     Plan:    Declined cognitive test   I have personally reviewed and noted the following in the patient's chart:   . Medical and social history . Use of alcohol, tobacco or illicit drugs  . Current medications and supplements . Functional ability and status . Nutritional status . Physical activity . Advanced directives . List of other physicians . Hospitalizations, surgeries, and ER visits in previous 12 months . Vitals . Screenings to include cognitive, depression, and falls . Referrals and appointments  In addition, I have reviewed and discussed with patient certain preventive protocols, quality metrics, and best practice recommendations. A written personalized care plan for preventive services as well as general preventive health recommendations were provided to patient.     Kellie Simmering, LPN  [id number]

## 2018-06-03 NOTE — Patient Instructions (Signed)
Tracey Castro , Thank you for taking time to come for your Medicare Wellness Visit. I appreciate your ongoing commitment to your health goals. Please review the following plan we discussed and let me know if I can assist you in the future.   Screening recommendations/referrals: Colonoscopy: 02/2013 Mammogram: 07/2017 Bone Density: 05/2017 Recommended yearly ophthalmology/optometry visit for glaucoma screening and checkup Recommended yearly dental visit for hygiene and checkup  Vaccinations: Influenza vaccine: today Pneumococcal vaccine: 03/2015 Tdap vaccine: 02/2013 Shingles vaccine:      Advanced directives: Please bring a copy of your POA (Power of [place]) and/or Living Will to your next appointment.    Conditions/risks identified: Overweight Exercise for Older Adults Staying physically active is important as you age. The four types of exercises that are best for older adults are endurance, strength, balance, and flexibility. Contact your health care provider before you start any exercise routine. Ask your health care provider what activities are safe for you. What are the risks? Risks associated with exercising include:  Overdoing it. This may lead to sore muscles or fatigue.  Falls.  Injuries.  Dehydration. How to do these exercises Endurance exercises Endurance (aerobic) exercises raise your breathing rate and heart rate. Increasing your endurance helps you to do everyday tasks and stay healthy. By improving the health of your body system that includes your heart, lungs, and blood vessels (circulatory system), you may also delay or prevent diseases such as heart disease, diabetes, and bone loss (osteoporosis). Types of endurance exercises include:  Sports.  Indoor activities, such as using gym equipment, doing water aerobics, or dancing.  Outdoor activities, such as biking or jogging.  Tasks around the house, such as gardening, yard work, and heavy household chores  like cleaning.  Walking, such as hiking or walking around your neighborhood. When doing endurance exercises, make sure you:  Are aware of your surroundings.  Use safety equipment as directed.  Dress in layers when exercising outdoors.  Drink plenty of water to stay well hydrated. Build up endurance slowly. Start with 10 minutes at a time, and gradually build up to doing 30 minutes at a time. Unless your health care provider gave you different instructions, aim to exercise for a total of 150 minutes a week. Spread out that time so you are working on endurance on 3 or more days a week. Strength exercises Lifting, pulling, or pushing weights helps to strengthen muscles. Having stronger muscles makes it easier to do everyday activities, such as getting up from a chair, climbing stairs, carrying groceries, and playing with grandchildren. Strength exercises include arm and leg exercises that may be done:  With weights.  Without weights (using your own body weight).  With a resistance band. When doing strength exercises:  Move smoothly and steadily. Do not suddenly thrust or jerk the weights, the resistance band, or your body.  Start with no weights or with light weights, and gradually add more weight over time. Eventually, aim to use weights that are hard or very hard for you to lift. This means that you are able to do 8 repetitions with the weight, and the last few repetitions are very challenging.  Lift or push weights into position for 3 seconds, hold the position for 1 second, and then take 3 seconds to return to your starting position.  Breathe out (exhale) during difficult movements, like lifting or pushing weights. Breathe in (inhale) to relax your muscles before the next repetition.  Consider alternating arms or legs, especially when you  first start strength exercises.  Expect some slight muscle soreness after each session. Do strength exercises on 2 or more days a week, for 30  minutes at a time. Avoid exercising the same muscle groups two days in a row. For example, if you work on your leg muscles one day, work on your arm muscles the next day. When you can do two sets of 10-15 repetitions with a certain weight, increase the amount of weight. Balance Balance exercises can help to prevent falls. Balance exercises include:  Standing on one foot.  Heel-to-toe walk.  Balance walk.  Tai chi. Make sure you have something sturdy to hold onto while doing balance exercises, such as a sturdy chair. As your balance improves, challenge yourself by holding onto the chair with one hand instead of two, and then with no hands. Trying exercises with your eyes closed also challenges your balance, but be sure to have a sturdy surface (like a countertop) close by in case you need it. Do balance exercises as often as you want, or as often as directed by your health care provider. Strength exercises for the lower body also help to improve balance. Flexibility Flexibility exercises improve how far you can bend, straighten, move, or rotate parts of your body (range of motion). These exercises also help you to do everyday activities such as getting dressed or reaching for objects. Flexibility exercises include stretching different parts of the body, and they may be done in a standing or seated position or on the floor. When stretching, make sure you:  Keep a slight bend in your arms and legs. Avoid completely straightening ("locking") your joints.  Do not stretch so far that you feel pain. You should feel a mild stretching feeling. You may try stretching farther as you become more flexible over time.  Relax and breathe between stretches.  Hold onto something sturdy for balance as needed. Hold each stretch for 10-30 seconds. Repeat each stretch 3-5 times. General safety tips  Exercise in well-lit areas.  Do not hold your breath during exercises or stretches.  Warm up before  exercising, and cool down after exercising. This can help prevent injury.  Drink plenty of water during exercise or any activity that makes you sweat.  Use smooth, steady movements. Do not use sudden, jerking movements, especially when lifting weights or doing flexibility exercises.  If you are not sure if an exercise is safe for you, or you are not sure how to do an exercise, talk with your health care provider. This is especially important if you have had surgery on muscles, bones, or joints (orthopedic surgery). Where to find more information You can find more information about exercise for older adults from:  Your local health department, fitness center, or community center. These facilities may have programs for aging adults.  [employer] on Aging: http://kim-miller.com/  National Council on Aging: www.ncoa.org Summary  Staying physically active is important as you age.  Make sure to contact your health care provider before you start any exercise routine. Ask your health care provider what activities are safe for you.  Doing endurance, strength, balance, and flexibility exercises can help to delay or prevent certain diseases, such as heart disease, diabetes, and bone loss (osteoporosis). This information is not intended to replace advice given to you by your health care provider. Make sure you discuss any questions you have with your health care provider. Document Released: 10/23/2016 Document Revised: 10/23/2016 Document Reviewed: 10/23/2016 Elsevier Interactive Patient Education  2019  [employer].   Next appointment: 06/05/2018 labs   Preventive Care 65 Years and Older, Female Preventive care refers to lifestyle choices and visits with your health care provider that can promote health and wellness. What does preventive care include?  A yearly physical exam. This is also called an annual well check.  Dental exams once or twice a year.  Routine eye exams. Ask your health  care provider how often you should have your eyes checked.  Personal lifestyle choices, including:  Daily care of your teeth and gums.  Regular physical activity.  Eating a healthy diet.  Avoiding tobacco and drug use.  Limiting alcohol use.  Practicing safe sex.  Taking low-dose aspirin every day.  Taking vitamin and mineral supplements as recommended by your health care provider. What happens during an annual well check? The services and screenings done by your health care provider during your annual well check will depend on your age, overall health, lifestyle risk factors, and family history of disease. Counseling  Your health care provider may ask you questions about your:  Alcohol use.  Tobacco use.  Drug use.  Emotional well-being.  Home and relationship well-being.  Sexual activity.  Eating habits.  History of falls.  Memory and ability to understand (cognition).  Work and work Statistician.  Reproductive health. Screening  You may have the following tests or measurements:  Height, weight, and BMI.  Blood pressure.  Lipid and cholesterol levels. These may be checked every 5 years, or more frequently if you are over 40 years old.  Skin check.  Lung cancer screening. You may have this screening every year starting at age 41 if you have a 30-pack-year history of smoking and currently smoke or have quit within the past 15 years.  Fecal occult blood test (FOBT) of the stool. You may have this test every year starting at age 42.  Flexible sigmoidoscopy or colonoscopy. You may have a sigmoidoscopy every 5 years or a colonoscopy every 10 years starting at age 24.  Hepatitis C blood test.  Hepatitis B blood test.  Sexually transmitted disease (STD) testing.  Diabetes screening. This is done by checking your blood sugar (glucose) after you have not eaten for a while (fasting). You may have this done every 1-3 years.  Bone density scan. This is done  to screen for osteoporosis. You may have this done starting at age 73.  Mammogram. This may be done every 1-2 years. Talk to your health care provider about how often you should have regular mammograms. Talk with your health care provider about your test results, treatment options, and if necessary, the need for more tests. Vaccines  Your health care provider may recommend certain vaccines, such as:  Influenza vaccine. This is recommended every year.  Tetanus, diphtheria, and acellular pertussis (Tdap, Td) vaccine. You may need a Td booster every 10 years.  Zoster vaccine. You may need this after age 27.  Pneumococcal 13-valent conjugate (PCV13) vaccine. One dose is recommended after age 68.  Pneumococcal polysaccharide (PPSV23) vaccine. One dose is recommended after age 61. Talk to your health care provider about which screenings and vaccines you need and how often you need them. This information is not intended to replace advice given to you by your health care provider. Make sure you discuss any questions you have with your health care provider. Document Released: 06/30/2015 Document Revised: 02/21/2016 Document Reviewed: 04/04/2015 Elsevier Interactive Patient Education  2017 [place] Prevention in the [facility]  can cause injuries. They can happen to people of all ages. There are many things you can do to make your home safe and to help prevent falls. What can I do on the outside of my home?  Regularly fix the edges of walkways and driveways and fix any cracks.  Remove anything that might make you trip as you walk through a door, such as a raised step or threshold.  Trim any bushes or trees on the path to your home.  Use bright outdoor lighting.  Clear any walking paths of anything that might make someone trip, such as rocks or tools.  Regularly check to see if handrails are loose or broken. Make sure that both sides of any steps have handrails.  Any raised decks  and porches should have guardrails on the edges.  Have any leaves, snow, or ice cleared regularly.  Use sand or salt on walking paths during winter.  Clean up any spills in your garage right away. This includes oil or grease spills. What can I do in the bathroom?  Use night lights.  Install grab bars by the toilet and in the tub and shower. Do not use towel bars as grab bars.  Use non-skid mats or decals in the tub or shower.  If you need to sit down in the shower, use a plastic, non-slip stool.  Keep the floor dry. Clean up any water that spills on the floor as soon as it happens.  Remove soap buildup in the tub or shower regularly.  Attach bath mats securely with double-sided non-slip rug tape.  Do not have throw rugs and other things on the floor that can make you trip. What can I do in the bedroom?  Use night lights.  Make sure that you have a light by your bed that is easy to reach.  Do not use any sheets or blankets that are too big for your bed. They should not hang down onto the floor.  Have a firm chair that has side arms. You can use this for support while you get dressed.  Do not have throw rugs and other things on the floor that can make you trip. What can I do in the kitchen?  Clean up any spills right away.  Avoid walking on wet floors.  Keep items that you use a lot in easy-to-reach places.  If you need to reach something above you, use a strong step stool that has a grab bar.  Keep electrical cords out of the way.  Do not use floor polish or wax that makes floors slippery. If you must use wax, use non-skid floor wax.  Do not have throw rugs and other things on the floor that can make you trip. What can I do with my stairs?  Do not leave any items on the stairs.  Make sure that there are handrails on both sides of the stairs and use them. Fix handrails that are broken or loose. Make sure that handrails are as long as the stairways.  Check any  carpeting to make sure that it is firmly attached to the stairs. Fix any carpet that is loose or worn.  Avoid having throw rugs at the top or bottom of the stairs. If you do have throw rugs, attach them to the floor with carpet tape.  Make sure that you have a light switch at the top of the stairs and the bottom of the stairs. If you do not have them, ask someone  to add them for you. What else can I do to help prevent falls?  Wear shoes that:  Do not have high heels.  Have rubber bottoms.  Are comfortable and fit you well.  Are closed at the toe. Do not wear sandals.  If you use a stepladder:  Make sure that it is fully opened. Do not climb a closed stepladder.  Make sure that both sides of the stepladder are locked into place.  Ask someone to hold it for you, if possible.  Clearly mark and make sure that you can see:  Any grab bars or handrails.  First and last steps.  Where the edge of each step is.  Use tools that help you move around (mobility aids) if they are needed. These include:  Canes.  Walkers.  Scooters.  Crutches.  Turn on the lights when you go into a dark area. Replace any light bulbs as soon as they burn out.  Set up your furniture so you have a clear path. Avoid moving your furniture around.  If any of your floors are uneven, fix them.  If there are any pets around you, be aware of where they are.  Review your medicines with your doctor. Some medicines can make you feel dizzy. This can increase your chance of falling. Ask your doctor what other things that you can do to help prevent falls. This information is not intended to replace advice given to you by your health care provider. Make sure you discuss any questions you have with your health care provider. Document Released: 03/30/2009 Document Revised: 11/09/2015 Document Reviewed: 07/08/2014 Elsevier Interactive Patient Education  2017 [employer].

## 2018-06-03 NOTE — Addendum Note (Signed)
Addended by: Kellie Simmering on: 06/03/2018 04:56 PM   Modules accepted: Orders

## 2018-06-03 NOTE — Patient Instructions (Signed)
Healthy Eating Following a healthy eating pattern may help you to achieve and maintain a healthy body weight, reduce the risk of chronic disease, and live a long and productive life. It is important to follow a healthy eating pattern at an appropriate calorie level for your body. Your nutritional needs should be met primarily through food by choosing a variety of nutrient-rich foods. What are tips for following this plan? Reading food labels  Read labels and choose the following: ? Reduced or low sodium. ? Juices with 100% fruit juice. ? Foods with low saturated fats and high polyunsaturated and monounsaturated fats. ? Foods with whole grains, such as whole wheat, cracked wheat, brown rice, and wild rice. ? Whole grains that are fortified with folic acid. This is recommended for women who are pregnant or who want to become pregnant.  Read labels and avoid the following: ? Foods with a lot of added sugars. These include foods that contain brown sugar, corn sweetener, corn syrup, dextrose, fructose, glucose, high-fructose corn syrup, honey, invert sugar, lactose, malt syrup, maltose, molasses, raw sugar, sucrose, trehalose, or turbinado sugar.  Do not eat more than the following amounts of added sugar per day:  6 teaspoons (25 g) for women.  9 teaspoons (38 g) for men. ? Foods that contain processed or refined starches and grains. ? Refined grain products, such as white flour, degermed cornmeal, white bread, and white rice. Shopping  Choose nutrient-rich snacks, such as vegetables, whole fruits, and nuts. Avoid high-calorie and high-sugar snacks, such as potato chips, fruit snacks, and candy.  Use oil-based dressings and spreads on foods instead of solid fats such as butter, stick margarine, or cream cheese.  Limit pre-made sauces, mixes, and "instant" products such as flavored rice, instant noodles, and ready-made pasta.  Try more plant-protein sources, such as tofu, tempeh, black beans,  edamame, lentils, nuts, and seeds.  Explore eating plans such as the Mediterranean diet or vegetarian diet. Cooking  Use oil to saut or stir-fry foods instead of solid fats such as butter, stick margarine, or lard.  Try baking, boiling, grilling, or broiling instead of frying.  Remove the fatty part of meats before cooking.  Steam vegetables in water or broth. Meal planning   At meals, imagine dividing your plate into fourths: ? One-half of your plate is fruits and vegetables. ? One-fourth of your plate is whole grains. ? One-fourth of your plate is protein, especially lean meats, poultry, eggs, tofu, beans, or nuts.  Include low-fat dairy as part of your daily diet. Lifestyle  Choose healthy options in all settings, including home, work, school, restaurants, or stores.  Prepare your food safely: ? Wash your hands after handling raw meats. ? Keep food preparation surfaces clean by regularly washing with hot, soapy water. ? Keep raw meats separate from ready-to-eat foods, such as fruits and vegetables. ? Cook seafood, meat, poultry, and eggs to the recommended internal temperature. ? Store foods at safe temperatures. In general:  Keep cold foods at 59F (4.4C) or below.  Keep hot foods at 159F (60C) or above.  Keep your freezer at [facility] (-17.8C) or below.  Foods are no longer safe to eat when they have been between the temperatures of 40-159F (4.4-60C) for more than 2 hours. What foods should I eat? Fruits Aim to eat 2 cup-equivalents of fresh, canned (in natural juice), or frozen fruits each day. Examples of 1 cup-equivalent of fruit include 1 small apple, 8 large strawberries, 1 cup canned fruit,  cup  dried fruit, or 1 cup 100% juice. Vegetables Aim to eat 2-3 cup-equivalents of fresh and frozen vegetables each day, including different varieties and colors. Examples of 1 cup-equivalent of vegetables include 2 medium carrots, 2 cups raw, leafy greens, 1 cup chopped  vegetable (raw or cooked), or 1 medium baked potato. Grains Aim to eat 6 ounce-equivalents of whole grains each day. Examples of 1 ounce-equivalent of grains include 1 slice of bread, 1 cup ready-to-eat cereal, 3 cups popcorn, or  cup cooked rice, pasta, or cereal. Meats and other proteins Aim to eat 5-6 ounce-equivalents of protein each day. Examples of 1 ounce-equivalent of protein include 1 egg, 1/2 cup nuts or seeds, or 1 tablespoon (16 g) peanut butter. A cut of meat or fish that is the size of a deck of cards is about 3-4 ounce-equivalents.  Of the protein you eat each week, try to have at least 8 ounces come from seafood. This includes salmon, trout, herring, and anchovies. Dairy Aim to eat 3 cup-equivalents of fat-free or low-fat dairy each day. Examples of 1 cup-equivalent of dairy include 1 cup (240 mL) milk, 8 ounces (250 g) yogurt, 1 ounces (44 g) natural cheese, or 1 cup (240 mL) fortified soy milk. Fats and oils  Aim for about 5 teaspoons (21 g) per day. Choose monounsaturated fats, such as canola and olive oils, avocados, peanut butter, and most nuts, or polyunsaturated fats, such as sunflower, corn, and soybean oils, walnuts, pine nuts, sesame seeds, sunflower seeds, and flaxseed. Beverages  Aim for six 8-oz glasses of water per day. Limit coffee to three to five 8-oz cups per day.  Limit caffeinated beverages that have added calories, such as soda and energy drinks.  Limit alcohol intake to no more than 1 drink a day for nonpregnant women and 2 drinks a day for men. One drink equals 12 oz of beer (355 mL), 5 oz of wine (148 mL), or 1 oz of hard liquor (44 mL). Seasoning and other foods  Avoid adding excess amounts of salt to your foods. Try flavoring foods with herbs and spices instead of salt.  Avoid adding sugar to foods.  Try using oil-based dressings, sauces, and spreads instead of solid fats. This information is based on general [place] nutrition guidelines. For more  information, visit Build[employer].es. Exact amounts may vary based on your nutrition needs. Summary  A healthy eating plan may help you to maintain a healthy weight, reduce the risk of chronic diseases, and stay active throughout your life.  Plan your meals. Make sure you eat the right portions of a variety of nutrient-rich foods.  Try baking, boiling, grilling, or broiling instead of frying.  Choose healthy options in all settings, including home, work, school, restaurants, or stores. This information is not intended to replace advice given to you by your health care provider. Make sure you discuss any questions you have with your health care provider. Document Released: 09/15/2017 Document Revised: 09/15/2017 Document Reviewed: 09/15/2017 Elsevier Interactive Patient Education  2019 [employer].

## 2018-06-03 NOTE — Progress Notes (Unsigned)
le

## 2018-06-04 DIAGNOSIS — J449 Chronic obstructive pulmonary disease, unspecified: Secondary | ICD-10-CM | POA: Diagnosis not present

## 2018-06-04 DIAGNOSIS — E785 Hyperlipidemia, unspecified: Secondary | ICD-10-CM | POA: Diagnosis not present

## 2018-06-04 DIAGNOSIS — E559 Vitamin D deficiency, unspecified: Secondary | ICD-10-CM | POA: Diagnosis not present

## 2018-06-04 DIAGNOSIS — R7309 Other abnormal glucose: Secondary | ICD-10-CM | POA: Diagnosis not present

## 2018-06-05 ENCOUNTER — Ambulatory Visit: Payer: Medicare Other | Admitting: Internal Medicine

## 2018-06-05 ENCOUNTER — Encounter: Payer: Self-pay | Admitting: Internal Medicine

## 2018-06-05 LAB — CMP14+EGFR
ALT: 21 IU/L (ref 0–32)
AST: 19 IU/L (ref 0–40)
Albumin/Globulin Ratio: 1.8 (ref 1.2–2.2)
Albumin: 4.4 g/dL (ref 3.5–4.8)
Alkaline Phosphatase: 84 IU/L (ref 39–117)
BUN/Creatinine Ratio: 21 (ref 12–28)
BUN: 15 mg/dL (ref 8–27)
Bilirubin Total: 0.6 mg/dL (ref 0.0–1.2)
CO2: 22 mmol/L (ref 20–29)
Calcium: 9.8 mg/dL (ref 8.7–10.3)
Chloride: 103 mmol/L (ref 96–106)
Creatinine, Ser: 0.73 mg/dL (ref 0.57–1.00)
GFR calc Af Amer: 94 mL/min/{1.73_m2} (ref >59)
GFR calc non Af Amer: 82 mL/min/{1.73_m2} (ref >59)
Globulin, Total: 2.5 g/dL (ref 1.5–4.5)
Glucose: 112 mg/dL — ABNORMAL HIGH (ref 65–99)
Potassium: 4.5 mmol/L (ref 3.5–5.2)
Sodium: 144 mmol/L (ref 134–144)
Total Protein: 6.9 g/dL (ref 6.0–8.5)

## 2018-06-05 LAB — HEMOGLOBIN A1C
Est. average glucose Bld gHb Est-mCnc: 128 mg/dL
Hgb A1c MFr Bld: 6.1 % — ABNORMAL HIGH (ref 4.8–5.6)

## 2018-06-05 LAB — LIPID PANEL
Chol/HDL Ratio: 3.2 ratio (ref 0.0–4.4)
Cholesterol, Total: 206 mg/dL — ABNORMAL HIGH (ref 100–199)
HDL: 65 mg/dL (ref >39)
LDL Calculated: 121 mg/dL — ABNORMAL HIGH (ref 0–99)
Triglycerides: 102 mg/dL (ref 0–149)
VLDL Cholesterol Cal: 20 mg/dL (ref 5–40)

## 2018-06-05 LAB — CBC
Hematocrit: 37.7 % (ref 34.0–46.6)
Hemoglobin: 12.4 g/dL (ref 11.1–15.9)
MCH: 28.5 pg (ref 26.6–33.0)
MCHC: 32.9 g/dL (ref 31.5–35.7)
MCV: 87 fL (ref 79–97)
Platelets: 385 10*3/uL (ref 150–450)
RBC: 4.35 x10E6/uL (ref 3.77–5.28)
RDW: 13.2 % (ref 12.3–15.4)
WBC: 7.4 10*3/uL (ref 3.4–10.8)

## 2018-06-05 LAB — HEPATITIS C ANTIBODY: Hep C Virus Ab: <0.1 s/co ratio (ref 0.0–0.9)

## 2018-06-07 NOTE — Progress Notes (Signed)
Here are your lab results:  You are negative for hepatitis c virus antibody. Your blood count is normal. Your hba1c is 6.1, this is in the prediabetes range. It is important to resume your regular activity - incorporate more exercise into your daily routine. Try to aim for 30 minutes five days weekly.   Your liver and kidney function are stable. Your LDL, bad cholesterol, is 121. Ideally, this should be less than 100. Again, exercise will help to improve these levels.  Happy holidays to you and your family! We should check your cholesterol again in six months!   Take care!  Sincerely,    Ramah Langhans N. Baird Cancer, MD

## 2018-06-08 ENCOUNTER — Encounter: Payer: Self-pay | Admitting: Internal Medicine

## 2018-06-08 NOTE — Telephone Encounter (Signed)
Dr. Melvyn Novas can you please advise what these results are so the patient can understand?  PFT's  05/08/2018  FEV1 1.30 (65 % ) ratio 73  p 14 % improvement from saba p nothing prior to study with DLCO  67 % corrects to 91  % for alv volume  - significant curvature even p saba so rec reduce symb to 80 2bid

## 2018-06-08 NOTE — Telephone Encounter (Signed)
It shows minimal airflow obstruction thus the recommendation to reduce the symbicort to the lower strength to see if does just as well using the principle that the lowest dose that works is the best dose and regroup  next visit - ok to move up if she's not doing as well as she would like and we'll go over more details then.

## 2018-06-12 DIAGNOSIS — M25551 Pain in right hip: Secondary | ICD-10-CM | POA: Diagnosis not present

## 2018-06-12 DIAGNOSIS — M1611 Unilateral primary osteoarthritis, right hip: Secondary | ICD-10-CM | POA: Diagnosis not present

## 2018-06-12 DIAGNOSIS — M25571 Pain in right ankle and joints of right foot: Secondary | ICD-10-CM | POA: Diagnosis not present

## 2018-06-12 DIAGNOSIS — S93401A Sprain of unspecified ligament of right ankle, initial encounter: Secondary | ICD-10-CM | POA: Diagnosis not present

## 2018-06-18 ENCOUNTER — Encounter: Payer: Self-pay | Admitting: Internal Medicine

## 2018-06-18 NOTE — Telephone Encounter (Signed)
Ok to keep on hand the symb 160 2bid which can start about 5-7 days prior to arrival in any area that's smokey but obviously best option is avoid smokey areas entirely

## 2018-06-21 ENCOUNTER — Encounter: Payer: Self-pay | Admitting: Internal Medicine

## 2018-06-21 NOTE — Progress Notes (Addendum)
Subjective:     Patient ID: Tracey Castro , female    DOB: 04-22-1946 , 73 y.o.   MRN: [id number]   Chief Complaint  Patient presents with  . medication f/u    HPI  She is here today for med f/u. She had AWV performed earlier today with [facility]. She has had an extensive pulmonary evaluation since her last visit. She developed URI while [place] - eventually found to have COPD and bronchiectasis. There was also MAI infection which was evaluated by Infectious Disease. She reports compliance with inhalers. She admits that she does feel better while on this regimen.     Past Medical History:  Diagnosis Date  . Hyperlipidemia   . Mycobacterium avium complex ([place])      Family History  Problem Relation Age of Onset  . Cancer - Ovarian Mother   . Cancer - Colon Father   . Parkinson's disease Father      Current Outpatient Medications:  .  budesonide-formoterol (SYMBICORT) 80-4.5 MCG/ACT inhaler, Inhale 2 puffs into the lungs 2 (two) times daily., Disp: 1 Inhaler, Rfl: 11 .  Cholecalciferol (VITAMIN D) 2000 units tablet, Take 4,000-6,000 Units by mouth daily., Disp: , Rfl:  .  doxylamine, Sleep, (UNISOM) 25 MG tablet, Take 12.5 mg by mouth at bedtime as needed for sleep., Disp: , Rfl:  .  fluticasone (FLONASE) 50 MCG/ACT nasal spray, Place 1 spray into the nose daily as needed for allergies. , Disp: , Rfl:  .  ibuprofen (ADVIL,MOTRIN) 200 MG tablet, Take 400 mg by mouth 2 (two) times daily as needed for headache or moderate pain., Disp: , Rfl:  .  Multiple Vitamin (MULTIVITAMIN) capsule, Take 1 capsule by mouth daily., Disp: , Rfl:  .  vitamin B-12 (CYANOCOBALAMIN) 1000 MCG tablet, Take 1,000 mcg by mouth daily., Disp: , Rfl:    Allergies  Allergen Reactions  . Sulfamethoxazole Rash     Review of Systems  Constitutional: Negative.   Respiratory: Negative.   Cardiovascular: Negative.   Gastrointestinal: Negative.   Neurological: Negative.   Psychiatric/Behavioral:  Negative.      Today's Vitals   06/03/18 1116  BP: 116/78  Pulse: 75  Temp: 98.2 F (36.8 C)  TempSrc: Oral  Weight: 146 lb 6.4 oz (66.4 kg)  Height: _0  (1.575 m)  PainSc: 0-No pain   Body mass index is 26.78 kg/m.   Objective:  Physical Exam Vitals signs and nursing note reviewed.  Constitutional:      Appearance: Normal appearance.  HENT:     Head: Normocephalic and atraumatic.  Cardiovascular:     Rate and Rhythm: Normal rate and regular rhythm.     Heart sounds: Normal heart sounds.  Pulmonary:     Effort: Pulmonary effort is normal.     Breath sounds: Rhonchi present.  Skin:    General: Skin is warm.  Neurological:     General: No focal deficit present.     Mental Status: She is alert.  Psychiatric:        Mood and Affect: Mood normal.         Assessment And Plan:     1. Chronic obstructive pulmonary disease, unspecified COPD type ([place])  Pulmonary notes reviewed in detail during her visit. She will continue with mgmt as per pulmonary.   - CMP14+EGFR; Future - CBC no Diff; Future - Hepatitis C antibody - CBC no Diff - CMP14+EGFR  2. Bronchiectasis without complication ([place])  Recent CT scan results reviewed in  full detail.   3. Dyslipidemia  I will check a lipid panel. She is encouraged to limit her fried foods intake, resume a regular exercise regimen and increase her fish intake.    - Lipid Profile; Future - Lipid Profile  4. Other abnormal glucose  HER A1C HAS BEEN ELEVATED IN THE PAST. I WILL CHECK AN A1C, BMET TODAY. SHE WAS ENCOURAGED TO AVOID SUGARY BEVERAGES AND PROCESSED FOODS INCLUDNG BREADS, RICE AND PASTA.  - Hemoglobin A1c; Future - Hemoglobin A1c  5. Need for immunization against influenza  She was given high dose flu vaccine.   Maximino Greenland, MD

## 2018-06-23 LAB — VITAMIN D 25 HYDROXY (VIT D DEFICIENCY, FRACTURES): Vit D, 25-Hydroxy: 31 ng/mL (ref 30.0–100.0)

## 2018-06-23 NOTE — Progress Notes (Signed)
Your vitamin D results are the low end of normal. What dose of vitamin D are you taking? If none, I suggest you start vit d3 2000 unit capsules once daily.

## 2018-06-24 DIAGNOSIS — M9903 Segmental and somatic dysfunction of lumbar region: Secondary | ICD-10-CM | POA: Diagnosis not present

## 2018-06-24 DIAGNOSIS — S338XXA Sprain of other parts of lumbar spine and pelvis, initial encounter: Secondary | ICD-10-CM | POA: Diagnosis not present

## 2018-06-24 DIAGNOSIS — S29012A Strain of muscle and tendon of back wall of thorax, initial encounter: Secondary | ICD-10-CM | POA: Diagnosis not present

## 2018-06-24 DIAGNOSIS — M9905 Segmental and somatic dysfunction of pelvic region: Secondary | ICD-10-CM | POA: Diagnosis not present

## 2018-06-24 DIAGNOSIS — M9902 Segmental and somatic dysfunction of thoracic region: Secondary | ICD-10-CM | POA: Diagnosis not present

## 2018-06-24 DIAGNOSIS — S39012A Strain of muscle, fascia and tendon of lower back, initial encounter: Secondary | ICD-10-CM | POA: Diagnosis not present

## 2018-06-24 MED ORDER — BUDESONIDE-FORMOTEROL FUMARATE 160-4.5 MCG/ACT IN AERO: 2.0000 | INHALATION_SPRAY | Freq: Two times a day (BID) | RESPIRATORY_TRACT | 0 refills | Status: DC

## 2018-06-26 ENCOUNTER — Other Ambulatory Visit: Payer: Self-pay | Admitting: Internal Medicine

## 2018-06-26 DIAGNOSIS — Z1231 Encounter for screening mammogram for malignant neoplasm of breast: Secondary | ICD-10-CM

## 2018-07-24 ENCOUNTER — Ambulatory Visit: Payer: Medicare Other

## 2018-08-07 ENCOUNTER — Telehealth: Payer: Self-pay | Admitting: Internal Medicine

## 2018-08-07 NOTE — Telephone Encounter (Signed)
ATC pt, no answer. Left message for pt to call back.  

## 2018-08-10 ENCOUNTER — Ambulatory Visit: Payer: Medicare Other | Admitting: Internal Medicine

## 2018-08-10 NOTE — Telephone Encounter (Signed)
Left message for patient. If she calls back, please schedule her as a new patient for RA please. Thanks!

## 2018-08-10 NOTE — Telephone Encounter (Signed)
Okay with me. New patient slot please

## 2018-08-10 NOTE — Telephone Encounter (Signed)
Fine with me

## 2018-08-10 NOTE — Telephone Encounter (Signed)
Spoke with pt, she would like a second opinion and would like to switch from Dr. Melvyn Novas to Dr. Elsworth Soho. RA and MW please advise if it is ok to switch.

## 2018-08-10 NOTE — Telephone Encounter (Signed)
Will await RA's response.

## 2018-08-10 NOTE — Telephone Encounter (Signed)
LMTCB x2 for pt 

## 2018-08-11 NOTE — Telephone Encounter (Signed)
ATC Patient.  Left message to call back. 

## 2018-08-12 NOTE — Telephone Encounter (Signed)
Patient returned call and was scheduled for 30 min OV with RA.  No call back is necessary.

## 2018-08-12 NOTE — Telephone Encounter (Signed)
Noted.  Will close encounter.  

## 2018-08-12 NOTE — Telephone Encounter (Signed)
LMTCB x3 for pt.  

## 2018-08-21 ENCOUNTER — Ambulatory Visit
Admission: RE | Admit: 2018-08-21 | Discharge: 2018-08-21 | Disposition: A | Discharge status: Home / Self Care | Payer: Medicare Other | Source: Ambulatory Visit | Attending: Internal Medicine | Admitting: Internal Medicine

## 2018-08-21 DIAGNOSIS — Z1231 Encounter for screening mammogram for malignant neoplasm of breast: Secondary | ICD-10-CM | POA: Diagnosis not present

## 2018-08-26 ENCOUNTER — Ambulatory Visit (INDEPENDENT_AMBULATORY_CARE_PROVIDER_SITE_OTHER): Payer: Medicare Other | Admitting: Pulmonary Disease

## 2018-08-26 ENCOUNTER — Encounter: Payer: Self-pay | Admitting: Pulmonary Disease

## 2018-08-26 ENCOUNTER — Other Ambulatory Visit: Payer: Self-pay

## 2018-08-26 VITALS — BP 112/64 | HR 74 | Ht 62.0 in

## 2018-08-26 DIAGNOSIS — J471 Bronchiectasis with (acute) exacerbation: Secondary | ICD-10-CM

## 2018-08-26 DIAGNOSIS — J449 Chronic obstructive pulmonary disease, unspecified: Secondary | ICD-10-CM

## 2018-08-26 DIAGNOSIS — R059 Cough, unspecified: Secondary | ICD-10-CM

## 2018-08-26 DIAGNOSIS — J479 Bronchiectasis, uncomplicated: Secondary | ICD-10-CM

## 2018-08-26 DIAGNOSIS — R05 Cough: Secondary | ICD-10-CM

## 2018-08-26 NOTE — Assessment & Plan Note (Signed)
Stay on symbicort 2 puffs twice daily - rinse mouth after use Although never smoker, recurrent spirometry have shown airway obstruction, this may be related to bronchiectasis.  Clinical history is not consistent with asthma

## 2018-08-26 NOTE — Assessment & Plan Note (Addendum)
High resolution CT chest - no contrast  We discussed signs and symptoms of flare with emphasis on airway clearance as main treatment strategy

## 2018-08-26 NOTE — Progress Notes (Signed)
Subjective:    Patient ID: Tracey Castro, female    DOB: 1945-09-12, 73 y.o.   MRN: [id number]  HPI  73 year old never smoker presents for second opinion of bronchiectasis. She presented 11/2017 with a left lingular infiltrate that seem to persist on follow-up chest x-ray on 01/2018.  Symptoms seemed to start with a bad cough after a trip to [place] in 02/2017.  She then developed dyspnea in 06/2017 while she was visiting [place].  She was seen by ENT and treated for sinus infection She underwent CT chest 01/2018 which showed chronic consolidations within the lingula and right middle lobe Subsequent bronchoscopy was nondiagnostic.  Spirometry was suggestive mild airway obstruction with improvement with bronchodilator, hence she was placed on Symbicort and this has subjectively helped.  She would like to clarify if she has a diagnosis of "COPD"  Accompanied by her husband Eddie Dibbles She denies frequent chest colds, frequent wheezing.  Occasional dry cough    Spirometry today showed worsening airway obstruction with ratio of 69, FEV1 52% FVC 57% Significant tests/ events reviewed  CT chest 01/2018 Focal dense chronic appearing consolidations within the lingula and RIGHT middle lobe, with associated traction bronchiectasis, Quant TB  02/19/18 neg  - Quant Ig's 03/18/2018 nl  - Alpha one AT screen  03/18/18  MM, level 128  - FOB 03/27/2018   Minimal  secretions>>>   BAL lingula >>>  Neg cytology,  Neg afb , fungal  Allergy profile 03/18/2018 >  Eos 0.1 /  IgE 4 RAST neg    Spirometry 03/18/2018  FEV1 1.0 (49%)  Ratio 70  - PFT's  05/08/2018  FEV1 1.30 (65 % ) ratio 65 p 14 % improvement , DLCO  67 % corrects to 91  % for alv volume  -    Past Medical History:  Diagnosis Date  . Hyperlipidemia   . Mycobacterium avium complex [facility])     Past Surgical History:  Procedure Laterality Date  . BREAST CYST ASPIRATION    . VIDEO BRONCHOSCOPY Bilateral 03/27/2018   Procedure: VIDEO BRONCHOSCOPY  WITHOUT FLUORO;  Surgeon: Tanda Rockers, MD;  Location: [place];  Service: Cardiopulmonary;  Laterality: Bilateral;    Allergies  Allergen Reactions  . Sulfamethoxazole Rash    Social History   Socioeconomic History  . Marital status: Married    Spouse name: Not on file  . Number of children: Not on file  . Years of education: Not on file  . Highest education level: Not on file  Occupational History  . Occupation: retired  Scientific laboratory technician  . Financial resource strain: Not hard at all  . Food insecurity:    Worry: Never true    Inability: Never true  . Transportation needs:    Medical: No    Non-medical: No  Tobacco Use  . Smoking status: Never Smoker  . Smokeless tobacco: Never Used  Substance and Sexual Activity  . Alcohol use: Yes    Alcohol/week: 1.0 standard drinks    Types: 1 Glasses of wine per week  . Drug use: Never  . Sexual activity: Not Currently  Lifestyle  . Physical activity:    Days per week: 3 days    Minutes per session: 60 min  . Stress: Not at all  Relationships  . Social connections:    Talks on phone: Not on file    Gets together: Not on file    Attends religious service: Not on file    Active member of club  or organization: Not on file    Attends meetings of clubs or organizations: Not on file    Relationship status: Married  . Intimate partner violence:    Fear of current or ex partner: No    Emotionally abused: No    Physically abused: No    Forced sexual activity: No  Other Topics Concern  . Not on file  Social History Narrative  . Not on file    Family History  Problem Relation Age of Onset  . Cancer - Ovarian Mother   . Cancer - Colon Father   . Parkinson's disease Father     Review of Systems neg for any significant sore throat, dysphagia, itching, sneezing, nasal congestion or excess/ purulent secretions, fever, chills, sweats, unintended wt loss, pleuritic or exertional cp, hempoptysis, orthopnea pnd or change in  chronic leg swelling. Also denies presyncope, palpitations, heartburn, abdominal pain, nausea, vomiting, diarrhea or change in bowel or urinary habits, dysuria,hematuria, rash, arthralgias, visual complaints, headache, numbness weakness or ataxia.     Objective:   Physical Exam   Gen. Pleasant, well-nourished, in no distress, normal affect ENT - no pallor,icterus, no post nasal drip Neck: No JVD, no thyromegaly, no carotid bruits Lungs: no use of accessory muscles, no dullness to percussion, clear without rales or rhonchi  Cardiovascular: Rhythm regular, heart sounds  normal, no murmurs or gallops, no peripheral edema Abdomen: soft and non-tender, no hepatosplenomegaly, BS normal. Musculoskeletal: No deformities, no cyanosis or clubbing Neuro:  alert, non focal         Assessment & Plan:

## 2018-08-26 NOTE — Patient Instructions (Signed)
High resolution CT chest - no contrast  Stay on symbicort 2 puffs twice daily - rinse mouth after use

## 2018-09-03 NOTE — Telephone Encounter (Signed)
rec'd email from pt in need of rescheduling March 2020 CT scan  Spoke with Marzetta Board at [facility] today  CT scan for March 2020 is being rescheduled to 6-8 weeks out per pt request Nothing further needed at this time

## 2018-09-14 ENCOUNTER — Other Ambulatory Visit: Payer: Medicare Other

## 2018-11-10 ENCOUNTER — Encounter: Payer: Self-pay | Admitting: Internal Medicine

## 2018-11-10 ENCOUNTER — Other Ambulatory Visit: Payer: Self-pay

## 2018-11-10 ENCOUNTER — Ambulatory Visit (INDEPENDENT_AMBULATORY_CARE_PROVIDER_SITE_OTHER): Payer: Medicare Other | Admitting: Internal Medicine

## 2018-11-10 VITALS — Temp 98.1°F | Ht 62.0 in | Wt 149.4 lb

## 2018-11-10 DIAGNOSIS — E785 Hyperlipidemia, unspecified: Secondary | ICD-10-CM

## 2018-11-10 DIAGNOSIS — R42 Dizziness and giddiness: Secondary | ICD-10-CM

## 2018-11-10 DIAGNOSIS — R7309 Other abnormal glucose: Secondary | ICD-10-CM

## 2018-11-10 DIAGNOSIS — Z79899 Other long term (current) drug therapy: Secondary | ICD-10-CM | POA: Diagnosis not present

## 2018-11-11 ENCOUNTER — Encounter: Payer: Self-pay | Admitting: Internal Medicine

## 2018-11-11 LAB — CBC
Hematocrit: 38.5 % (ref 34.0–46.6)
Hemoglobin: 12.5 g/dL (ref 11.1–15.9)
MCH: 27.8 pg (ref 26.6–33.0)
MCHC: 32.5 g/dL (ref 31.5–35.7)
MCV: 86 fL (ref 79–97)
Platelets: 364 10*3/uL (ref 150–450)
RBC: 4.49 x10E6/uL (ref 3.77–5.28)
RDW: 13.2 % (ref 11.7–15.4)
WBC: 9 10*3/uL (ref 3.4–10.8)

## 2018-11-11 LAB — LIPID PANEL
Chol/HDL Ratio: 3.2 ratio (ref 0.0–4.4)
Cholesterol, Total: 178 mg/dL (ref 100–199)
HDL: 55 mg/dL (ref >39)
LDL Calculated: 96 mg/dL (ref 0–99)
Triglycerides: 134 mg/dL (ref 0–149)
VLDL Cholesterol Cal: 27 mg/dL (ref 5–40)

## 2018-11-11 LAB — BMP8+EGFR
BUN/Creatinine Ratio: 19 (ref 12–28)
BUN: 14 mg/dL (ref 8–27)
CO2: 25 mmol/L (ref 20–29)
Calcium: 9.7 mg/dL (ref 8.7–10.3)
Chloride: 100 mmol/L (ref 96–106)
Creatinine, Ser: 0.74 mg/dL (ref 0.57–1.00)
GFR calc Af Amer: 93 mL/min/{1.73_m2} (ref >59)
GFR calc non Af Amer: 81 mL/min/{1.73_m2} (ref >59)
Glucose: 101 mg/dL — ABNORMAL HIGH (ref 65–99)
Potassium: 4.3 mmol/L (ref 3.5–5.2)
Sodium: 140 mmol/L (ref 134–144)

## 2018-11-11 LAB — HEMOGLOBIN A1C
Est. average glucose Bld gHb Est-mCnc: 128 mg/dL
Hgb A1c MFr Bld: 6.1 % — ABNORMAL HIGH (ref 4.8–5.6)

## 2018-11-11 LAB — VITAMIN B12: Vitamin B-12: 1313 pg/mL — ABNORMAL HIGH (ref 232–1245)

## 2018-11-14 NOTE — Progress Notes (Signed)
Subjective:     Patient ID: Tracey Castro , female    DOB: [birth year] , 73 y.o.   MRN: [id number]   Chief Complaint  Patient presents with  . Dizziness    HPI  She is here today for further evaluation of dizziness. She reports that she was on a walk in her neighborhood when she started to "walk to the side". She is not sure what may have triggered her symptoms. She denies associated visual disturbances, ear pain, headache, chest pain and shortness of breath. She is not sure what may have triggered her symptoms. She has noticed some repeat symptoms when changing positions- going from seated to standing position. She denies change in her appetite. Admits that she may not drink enough fluids during the day.   Dizziness  This is a recurrent problem. The current episode started in the past 7 days. The problem has been gradually improving. Pertinent negatives include no change in bowel habit, joint swelling, nausea, urinary symptoms or visual change. The symptoms are aggravated by bending. She has tried rest for the symptoms.     Past Medical History:  Diagnosis Date  . Hyperlipidemia   . Mycobacterium avium complex ([place])      Family History  Problem Relation Age of Onset  . Cancer - Ovarian Mother   . Cancer - Colon Father   . Parkinson's disease Father      Current Outpatient Medications:  .  Cholecalciferol (VITAMIN D) 2000 units tablet, Take 4,000-6,000 Units by mouth daily., Disp: , Rfl:  .  diphenhydrAMINE HCl, Sleep, (SLEEP AID) 25 MG CAPS, Take by mouth. prn, Disp: , Rfl:  .  fluticasone (FLONASE) 50 MCG/ACT nasal spray, Place 1 spray into the nose daily as needed for allergies. , Disp: , Rfl:  .  ibuprofen (ADVIL,MOTRIN) 200 MG tablet, Take 400 mg by mouth 2 (two) times daily as needed for headache or moderate pain., Disp: , Rfl:  .  Magnesium 500 MG CAPS, Take by mouth., Disp: , Rfl:  .  Multiple Vitamin (MULTIVITAMIN) capsule, Take 1 capsule by mouth daily., Disp: ,  Rfl:  .  SYMBICORT 80-4.5 MCG/ACT inhaler, , Disp: , Rfl:  .  vitamin B-12 (CYANOCOBALAMIN) 1000 MCG tablet, Take 1,000 mcg by mouth daily., Disp: , Rfl:    Allergies  Allergen Reactions  . Sulfamethoxazole Rash     Review of Systems  Constitutional: Negative.   Respiratory: Negative.   Cardiovascular: Negative.   Gastrointestinal: Negative.  Negative for change in bowel habit and nausea.  Musculoskeletal: Negative for joint swelling.  Neurological: Positive for dizziness.  Psychiatric/Behavioral: Negative.      Today's Vitals   11/10/18 1110  Temp: 98.1 F (36.7 C)  TempSrc: Oral  Weight: 149 lb 6.4 oz (67.8 kg)  Height: _0  (1.575 m)  PainSc: 0-No pain   Body mass index is 27.33 kg/m.   Objective:  Physical Exam Vitals signs and nursing note reviewed.  Constitutional:      Appearance: Normal appearance.  HENT:     Head: Normocephalic and atraumatic.     Right Ear: Tympanic membrane, ear canal and external ear normal.     Left Ear: Tympanic membrane, ear canal and external ear normal.  Cardiovascular:     Rate and Rhythm: Normal rate and regular rhythm.     Heart sounds: Normal heart sounds.  Pulmonary:     Effort: Pulmonary effort is normal.     Breath sounds: Normal breath sounds.  Skin:  General: Skin is warm.  Neurological:     General: No focal deficit present.     Mental Status: She is alert.  Psychiatric:        Mood and Affect: Mood normal.        Behavior: Behavior normal.         Assessment And Plan:     1. Dizziness  Resolved. Orthostatics performed, this is negative. She is encouraged to drink 48-64 ounces of water per day. I will also check labs as listed below. I will make further recommendations once her labs are available for review. She is encouraged to let me know if her symptoms persist.   - CBC no Diff - BMP8+EGFR  2. Other abnormal glucose  HER A1C HAS BEEN ELEVATED IN THE PAST. I WILL CHECK AN A1C, BMET TODAY. SHE WAS  ENCOURAGED TO AVOID SUGARY BEVERAGES AND PROCESSED FOODS INCLUDNG BREADS, RICE AND PASTA.  - Hemoglobin A1c  3. Dyslipidemia  She requests a check on her lipid panel today. She reports she has been working hard to lower her numbers, she has been walking more and paying closer attention to her diet.   - Lipid panel  4. Drug therapy   - Vitamin B12         Maximino Greenland, MD    THE PATIENT IS ENCOURAGED TO PRACTICE SOCIAL DISTANCING DUE TO THE COVID-19 PANDEMIC.

## 2018-11-25 ENCOUNTER — Ambulatory Visit: Payer: Medicare Other | Admitting: Pulmonary Disease

## 2018-12-11 DIAGNOSIS — Z124 Encounter for screening for malignant neoplasm of cervix: Secondary | ICD-10-CM | POA: Diagnosis not present

## 2018-12-11 DIAGNOSIS — Z6827 Body mass index (BMI) 27.0-27.9, adult: Secondary | ICD-10-CM | POA: Diagnosis not present

## 2018-12-11 DIAGNOSIS — N952 Postmenopausal atrophic vaginitis: Secondary | ICD-10-CM | POA: Diagnosis not present

## 2018-12-11 DIAGNOSIS — N644 Mastodynia: Secondary | ICD-10-CM | POA: Diagnosis not present

## 2019-01-06 ENCOUNTER — Telehealth: Payer: Self-pay

## 2019-01-06 NOTE — Telephone Encounter (Signed)
LMTCB with patient. Will route message to MW as Juluis Rainier. Number to central scheduling [phone] to have patient r/s if she wishes to listed above. Nothing further needed at this time.   MW just FYI. This patient has been r/s twice for her CT.

## 2019-01-06 NOTE — Telephone Encounter (Signed)
-----   Message from Osvaldo Shipper, [place] sent at 01/04/2019  1:48 PM EDT ----- Regarding: CT FYI I have called this patient twice to reschedule CT that was canceled due to COVID restrictions the patient has not called back.  Thank you   Erline Levine

## 2019-01-06 NOTE — Telephone Encounter (Signed)
This is Tracey Castro's pt now

## 2019-01-12 NOTE — Telephone Encounter (Signed)
Noted. Message has been routed to RA. Left message with patient providing number to r/s if she wishes too. Will close encounter for now.

## 2019-01-19 DIAGNOSIS — Z124 Encounter for screening for malignant neoplasm of cervix: Secondary | ICD-10-CM | POA: Diagnosis not present

## 2019-01-19 DIAGNOSIS — R0789 Other chest pain: Secondary | ICD-10-CM | POA: Diagnosis not present

## 2019-01-19 DIAGNOSIS — R87625 Unsatisfactory cytologic smear of vagina: Secondary | ICD-10-CM | POA: Diagnosis not present

## 2019-01-26 ENCOUNTER — Telehealth: Payer: Self-pay | Admitting: Pulmonary Disease

## 2019-01-26 NOTE — Telephone Encounter (Signed)
Called and spoke with patient. She states she has been only taking her symbicort 80 2 puffs once a day. The symbicort wakes her up in the middle of the night and doesn't take it at night. She wants to know if there is some inhaler she can taken once a day.  But when she wakes up she has a lot of mucus, so not working over night.    TP please advise

## 2019-01-26 NOTE — Telephone Encounter (Signed)
Please set up office visit with Tracey Castro or APP to discuss this  Can be office visit or video visit

## 2019-01-27 ENCOUNTER — Telehealth: Payer: Medicare Other | Admitting: Primary Care

## 2019-01-27 NOTE — Telephone Encounter (Signed)
Called and spoke with pt letting her know that recommendations was for [place] to get her scheduled for a visit to further discuss inhalers. Pt verbalized understanding and has been scheduled mychart visit with EW today at 4pm. Nothing further needed.

## 2019-01-27 NOTE — Telephone Encounter (Signed)
ATC pt, no answer. Left message for pt to call back.  

## 2019-01-28 ENCOUNTER — Encounter: Payer: Self-pay | Admitting: Primary Care

## 2019-01-28 ENCOUNTER — Other Ambulatory Visit: Payer: Self-pay

## 2019-01-28 ENCOUNTER — Ambulatory Visit (INDEPENDENT_AMBULATORY_CARE_PROVIDER_SITE_OTHER): Payer: Medicare Other | Admitting: Primary Care

## 2019-01-28 DIAGNOSIS — J449 Chronic obstructive pulmonary disease, unspecified: Secondary | ICD-10-CM | POA: Diagnosis not present

## 2019-01-28 MED ORDER — BREO ELLIPTA 100-25 MCG/INH IN AEPB: 1.0000 | INHALATION_SPRAY | Freq: Once | RESPIRATORY_TRACT | 6 refills | Status: AC

## 2019-01-28 NOTE — Patient Instructions (Addendum)
Stop Symbicort   Start Breo 1 puff daily  Follow up  2-3 months with Dr. Elsworth Soho (OR Eustaquio Maize, NP if not available)

## 2019-01-28 NOTE — Progress Notes (Signed)
Virtual Visit via Telephone Note  I connected with Tracey Castro on 01/28/19 at  9:30 AM EDT by telephone and verified that I am speaking with the correct person using two identifiers.  Location: Patient: Home Provider: Office    I discussed the limitations, risks, security and privacy concerns of performing an evaluation and management service by telephone and the availability of in person appointments. I also discussed with the patient that there may be a patient responsible charge related to this service. The patient expressed understanding and agreed to proceed.   History of Present Illness: 73 year old female, never smoked. PMH significant for COPD GOLD 0, bronchiectasis without complication, chronic rhinitis. Patient of Dr. Elsworth Soho, last seen in March 2020. Maintained on Symbicort twice daily.   01/28/2019 Patient contacted today for televisit to discuss alternative inhalers to Symbicort. Her breathing has been relatively stable. Heat can affect her shortness of breath, especially when walking hills outside in her neighborhood. States that Symbicort keeps her awake at night. She would like to switch to an inhaler that is once a day dosing. Discussed different options and she would like to try low dose BREO.   Spirometry 03/18/2018 FEV1 1.0 (49%) Ratio 70  - PFT's 05/08/2018 FEV1 1.30 (65 % ) ratio 65p 14 % improvement , DLCO 67 % corrects to 91 % for alv volume -    Observations/Objective:  - No shortness of breath, wheezing or cough  Assessment and Plan:  COPD GOLD 0 - Spirometry suggestive of mild airway obstruction with improvement with bronchodilator - STOP Symbicort - START Breo 100 one puff daily (rinse mouth after use)  Bronchiectasis - Overdue for HRCT   Follow Up Instructions:   FU in 2-3 months with Dr. Elsworth Soho or NP   I discussed the assessment and treatment plan with the patient. The patient was provided an opportunity to ask questions and all were  answered. The patient agreed with the plan and demonstrated an understanding of the instructions.   The patient was advised to call back or seek an in-person evaluation if the symptoms worsen or if the condition fails to improve as anticipated.  I provided 18 minutes of non-face-to-face time during this encounter.   Martyn Ehrich, NP

## 2019-03-18 ENCOUNTER — Other Ambulatory Visit: Payer: Self-pay

## 2019-03-18 ENCOUNTER — Encounter: Payer: Self-pay | Admitting: Pulmonary Disease

## 2019-03-18 ENCOUNTER — Ambulatory Visit (INDEPENDENT_AMBULATORY_CARE_PROVIDER_SITE_OTHER): Payer: Medicare Other | Admitting: Pulmonary Disease

## 2019-03-18 DIAGNOSIS — R05 Cough: Secondary | ICD-10-CM

## 2019-03-18 DIAGNOSIS — J479 Bronchiectasis, uncomplicated: Secondary | ICD-10-CM | POA: Diagnosis not present

## 2019-03-18 DIAGNOSIS — R059 Cough, unspecified: Secondary | ICD-10-CM

## 2019-03-18 DIAGNOSIS — Z23 Encounter for immunization: Secondary | ICD-10-CM

## 2019-03-18 MED ORDER — BREO ELLIPTA 100-25 MCG/INH IN AEPB: INHALATION_SPRAY | RESPIRATORY_TRACT | 1 refills | Status: DC

## 2019-03-18 NOTE — Patient Instructions (Signed)
Flu shot today Rx for Breo to be sent to [facility]  Call us if you have episode of bronchitis

## 2019-03-18 NOTE — Addendum Note (Signed)
Addended by: Amado Coe on: 03/18/2019 09:51 AM   Modules accepted: Orders

## 2019-03-18 NOTE — Assessment & Plan Note (Signed)
Discussed airway clearance for postnasal drip

## 2019-03-18 NOTE — Progress Notes (Signed)
   Subjective:    Patient ID: Tracey Castro, female    DOB: 1946/05/19, 73 y.o.   MRN: [id number]  HPI  73 year old never smoker for follow-up of bronchiectasis and chronic cough Although never smoker, recurrent spirometry have shown airway obstruction, this may be related to bronchiectasis.  Clinical history is not consistent with asthma   Chief Complaint  Patient presents with  . Chronic obstructive pulmonary disease    With cough that when productive is clear. But the mucus will feel like it stays in her throat. Started using neti pot, which helps.    On her last tele-visit 01/2019, she was switched from Symbicort to [facility].  This has worked for her. Cough is mostly controlled, she reports occasional postnasal drip. No episodes of recurrent bronchitis.  Occasional wheezing that seems to subside with Breo.  She has been social distancing and does not have any exposure to COVID, compliant with mask  I reviewed her prior spirometry and CT chest with her  Significant tests/ events reviewed  CT chest 01/2018 Focal dense chronic appearing consolidations within the lingula and RIGHT middle lobe, with associated traction bronchiectasis, Quant TB 02/19/18 neg  - Quant Ig's 03/18/2018 nl  - Alpha one AT screen 03/18/18 MM, level 128  - FOB 03/27/2018 Minimal  secretions>>>BAL lingula >>>Neg cytology, Neg afb , fungal  Allergy profile 03/18/2018 >Eos 0.1 / IgE 4 RAST neg   Spirometry 03/18/2018 FEV1 1.0 (49%) Ratio 70  - PFT's 05/08/2018 FEV1 1.30 (65 % ) ratio 65p 14 % improvement , DLCO 67 % corrects to 91 % for alv volume -   Spirometry 08/2018 worsening airway obstruction with ratio of 69, FEV1 52% FVC 57%  Review of Systems neg for any significant sore throat, dysphagia, itching, sneezing, nasal congestion or excess/ purulent secretions, fever, chills, sweats, unintended wt loss, pleuritic or exertional cp, hempoptysis, orthopnea pnd or change in chronic leg  swelling. Also denies presyncope, palpitations, heartburn, abdominal pain, nausea, vomiting, diarrhea or change in bowel or urinary habits, dysuria,hematuria, rash, arthralgias, visual complaints, headache, numbness weakness or ataxia.     Objective:   Physical Exam   Gen. Pleasant, well-nourished, in no distress ENT - no thrush, no pallor/icterus,no post nasal drip Neck: No JVD, no thyromegaly, no carotid bruits Lungs: no use of accessory muscles, no dullness to percussion, clear without rales or rhonchi  Cardiovascular: Rhythm regular, heart sounds  normal, no murmurs or gallops, no peripheral edema Musculoskeletal: No deformities, no cyanosis or clubbing         Assessment & Plan:

## 2019-03-18 NOTE — Assessment & Plan Note (Signed)
Flu shot today Rx for Breo to be sent to [facility]  We discussed signs and symptoms of bronchiectatic exacerbation I seriously doubt MAC here, since she is asymptomatic does not need repeat imaging at this time but will consider chest x-ray versus high-resolution CT on next visit At some point we do need to follow-up on her spirometry too

## 2019-04-15 DIAGNOSIS — M2041 Other hammer toe(s) (acquired), right foot: Secondary | ICD-10-CM | POA: Diagnosis not present

## 2019-04-15 DIAGNOSIS — M21962 Unspecified acquired deformity of left lower leg: Secondary | ICD-10-CM | POA: Diagnosis not present

## 2019-04-15 DIAGNOSIS — M2042 Other hammer toe(s) (acquired), left foot: Secondary | ICD-10-CM | POA: Diagnosis not present

## 2019-04-15 DIAGNOSIS — M21961 Unspecified acquired deformity of right lower leg: Secondary | ICD-10-CM | POA: Diagnosis not present

## 2019-04-26 ENCOUNTER — Encounter: Payer: Self-pay | Admitting: Internal Medicine

## 2019-04-26 DIAGNOSIS — M67911 Unspecified disorder of synovium and tendon, right shoulder: Secondary | ICD-10-CM | POA: Diagnosis not present

## 2019-04-26 DIAGNOSIS — M79645 Pain in left finger(s): Secondary | ICD-10-CM | POA: Diagnosis not present

## 2019-06-03 ENCOUNTER — Encounter: Payer: Self-pay | Admitting: Internal Medicine

## 2019-06-07 ENCOUNTER — Other Ambulatory Visit: Payer: Self-pay

## 2019-06-07 ENCOUNTER — Other Ambulatory Visit: Payer: Medicare Other

## 2019-06-07 DIAGNOSIS — R7309 Other abnormal glucose: Secondary | ICD-10-CM

## 2019-06-07 DIAGNOSIS — Z79899 Other long term (current) drug therapy: Secondary | ICD-10-CM

## 2019-06-07 DIAGNOSIS — E785 Hyperlipidemia, unspecified: Secondary | ICD-10-CM

## 2019-06-08 LAB — LIPID PANEL
Chol/HDL Ratio: 3 ratio (ref 0.0–4.4)
Cholesterol, Total: 198 mg/dL (ref 100–199)
HDL: 67 mg/dL (ref >39)
LDL Chol Calc (NIH): 112 mg/dL — ABNORMAL HIGH (ref 0–99)
Triglycerides: 110 mg/dL (ref 0–149)
VLDL Cholesterol Cal: 19 mg/dL (ref 5–40)

## 2019-06-08 LAB — HEMOGLOBIN A1C
Est. average glucose Bld gHb Est-mCnc: 131 mg/dL
Hgb A1c MFr Bld: 6.2 % — ABNORMAL HIGH (ref 4.8–5.6)

## 2019-06-09 ENCOUNTER — Ambulatory Visit (INDEPENDENT_AMBULATORY_CARE_PROVIDER_SITE_OTHER): Payer: Medicare Other

## 2019-06-09 ENCOUNTER — Other Ambulatory Visit: Payer: Self-pay

## 2019-06-09 ENCOUNTER — Encounter: Payer: Self-pay | Admitting: Internal Medicine

## 2019-06-09 ENCOUNTER — Ambulatory Visit: Payer: Medicare Other

## 2019-06-09 ENCOUNTER — Ambulatory Visit (INDEPENDENT_AMBULATORY_CARE_PROVIDER_SITE_OTHER): Payer: Medicare Other | Admitting: Internal Medicine

## 2019-06-09 VITALS — BP 118/62 | HR 94 | Temp 97.9°F | Ht 61.0 in | Wt 147.0 lb

## 2019-06-09 DIAGNOSIS — Z23 Encounter for immunization: Secondary | ICD-10-CM

## 2019-06-09 DIAGNOSIS — Z1211 Encounter for screening for malignant neoplasm of colon: Secondary | ICD-10-CM

## 2019-06-09 DIAGNOSIS — L659 Nonscarring hair loss, unspecified: Secondary | ICD-10-CM | POA: Diagnosis not present

## 2019-06-09 DIAGNOSIS — Z800 Family history of malignant neoplasm of digestive organs: Secondary | ICD-10-CM

## 2019-06-09 DIAGNOSIS — E7800 Pure hypercholesterolemia, unspecified: Secondary | ICD-10-CM | POA: Diagnosis not present

## 2019-06-09 DIAGNOSIS — R7309 Other abnormal glucose: Secondary | ICD-10-CM | POA: Diagnosis not present

## 2019-06-09 DIAGNOSIS — Z0000 Encounter for general adult medical examination without abnormal findings: Secondary | ICD-10-CM

## 2019-06-09 MED ORDER — PREVNAR 13 IM SUSP: 0.5000 mL | INTRAMUSCULAR | 0 refills | Status: AC

## 2019-06-09 MED ORDER — ZOSTER VAC RECOMB ADJUVANTED 50 MCG/0.5ML IM SUSR: 0.5000 mL | Freq: Once | INTRAMUSCULAR | 1 refills | Status: AC

## 2019-06-09 NOTE — Patient Instructions (Addendum)
Tracey Castro , Thank you for taking time to come for your Medicare Wellness Visit. I appreciate your ongoing commitment to your health goals. Please review the following plan we discussed and let me know if I can assist you in the future.   Screening recommendations/referrals: Colonoscopy: 02/2013 Mammogram: 08/2018 Bone Density: 05/2017 Recommended yearly ophthalmology/optometry visit for glaucoma screening and checkup Recommended yearly dental visit for hygiene and checkup  Vaccinations: Influenza vaccine: 03/2019 Pneumococcal vaccine: sent to pharmacy Tdap vaccine: 01/2011 Shingles vaccine: discussed    Advanced directives: Please bring a copy of your POA (Power of [place]) and/or Living Will to your next appointment.    Conditions/risks identified: overweight  Next appointment: 12/08/2019 at 10:30   Preventive Care 65 Years and Older, Female Preventive care refers to lifestyle choices and visits with your health care provider that can promote health and wellness. What does preventive care include?  A yearly physical exam. This is also called an annual well check.  Dental exams once or twice a year.  Routine eye exams. Ask your health care provider how often you should have your eyes checked.  Personal lifestyle choices, including:  Daily care of your teeth and gums.  Regular physical activity.  Eating a healthy diet.  Avoiding tobacco and drug use.  Limiting alcohol use.  Practicing safe sex.  Taking low-dose aspirin every day.  Taking vitamin and mineral supplements as recommended by your health care provider. What happens during an annual well check? The services and screenings done by your health care provider during your annual well check will depend on your age, overall health, lifestyle risk factors, and family history of disease. Counseling  Your health care provider may ask you questions about your:  Alcohol use.  Tobacco use.  Drug  use.  Emotional well-being.  Home and relationship well-being.  Sexual activity.  Eating habits.  History of falls.  Memory and ability to understand (cognition).  Work and work Statistician.  Reproductive health. Screening  You may have the following tests or measurements:  Height, weight, and BMI.  Blood pressure.  Lipid and cholesterol levels. These may be checked every 5 years, or more frequently if you are over 24 years old.  Skin check.  Lung cancer screening. You may have this screening every year starting at age 51 if you have a 30-pack-year history of smoking and currently smoke or have quit within the past 15 years.  Fecal occult blood test (FOBT) of the stool. You may have this test every year starting at age 39.  Flexible sigmoidoscopy or colonoscopy. You may have a sigmoidoscopy every 5 years or a colonoscopy every 10 years starting at age 67.  Hepatitis C blood test.  Hepatitis B blood test.  Sexually transmitted disease (STD) testing.  Diabetes screening. This is done by checking your blood sugar (glucose) after you have not eaten for a while (fasting). You may have this done every 1-3 years.  Bone density scan. This is done to screen for osteoporosis. You may have this done starting at age 76.  Mammogram. This may be done every 1-2 years. Talk to your health care provider about how often you should have regular mammograms. Talk with your health care provider about your test results, treatment options, and if necessary, the need for more tests. Vaccines  Your health care provider may recommend certain vaccines, such as:  Influenza vaccine. This is recommended every year.  Tetanus, diphtheria, and acellular pertussis (Tdap, Td) vaccine. You may need a  Td booster every 10 years.  Zoster vaccine. You may need this after age 35.  Pneumococcal 13-valent conjugate (PCV13) vaccine. One dose is recommended after age 49.  Pneumococcal polysaccharide  (PPSV23) vaccine. One dose is recommended after age 66. Talk to your health care provider about which screenings and vaccines you need and how often you need them. This information is not intended to replace advice given to you by your health care provider. Make sure you discuss any questions you have with your health care provider. Document Released: 06/30/2015 Document Revised: 02/21/2016 Document Reviewed: 04/04/2015 Elsevier Interactive Patient Education  2017 [place] Prevention in the Home Falls can cause injuries. They can happen to people of all ages. There are many things you can do to make your home safe and to help prevent falls. What can I do on the outside of my home?  Regularly fix the edges of walkways and driveways and fix any cracks.  Remove anything that might make you trip as you walk through a door, such as a raised step or threshold.  Trim any bushes or trees on the path to your home.  Use bright outdoor lighting.  Clear any walking paths of anything that might make someone trip, such as rocks or tools.  Regularly check to see if handrails are loose or broken. Make sure that both sides of any steps have handrails.  Any raised decks and porches should have guardrails on the edges.  Have any leaves, snow, or ice cleared regularly.  Use sand or salt on walking paths during winter.  Clean up any spills in your garage right away. This includes oil or grease spills. What can I do in the bathroom?  Use night lights.  Install grab bars by the toilet and in the tub and shower. Do not use towel bars as grab bars.  Use non-skid mats or decals in the tub or shower.  If you need to sit down in the shower, use a plastic, non-slip stool.  Keep the floor dry. Clean up any water that spills on the floor as soon as it happens.  Remove soap buildup in the tub or shower regularly.  Attach bath mats securely with double-sided non-slip rug tape.  Do not have  throw rugs and other things on the floor that can make you trip. What can I do in the bedroom?  Use night lights.  Make sure that you have a light by your bed that is easy to reach.  Do not use any sheets or blankets that are too big for your bed. They should not hang down onto the floor.  Have a firm chair that has side arms. You can use this for support while you get dressed.  Do not have throw rugs and other things on the floor that can make you trip. What can I do in the kitchen?  Clean up any spills right away.  Avoid walking on wet floors.  Keep items that you use a lot in easy-to-reach places.  If you need to reach something above you, use a strong step stool that has a grab bar.  Keep electrical cords out of the way.  Do not use floor polish or wax that makes floors slippery. If you must use wax, use non-skid floor wax.  Do not have throw rugs and other things on the floor that can make you trip. What can I do with my stairs?  Do not leave any items on the stairs.  Make sure that there are handrails on both sides of the stairs and use them. Fix handrails that are broken or loose. Make sure that handrails are as long as the stairways.  Check any carpeting to make sure that it is firmly attached to the stairs. Fix any carpet that is loose or worn.  Avoid having throw rugs at the top or bottom of the stairs. If you do have throw rugs, attach them to the floor with carpet tape.  Make sure that you have a light switch at the top of the stairs and the bottom of the stairs. If you do not have them, ask someone to add them for you. What else can I do to help prevent falls?  Wear shoes that:  Do not have high heels.  Have rubber bottoms.  Are comfortable and fit you well.  Are closed at the toe. Do not wear sandals.  If you use a stepladder:  Make sure that it is fully opened. Do not climb a closed stepladder.  Make sure that both sides of the stepladder are  locked into place.  Ask someone to hold it for you, if possible.  Clearly mark and make sure that you can see:  Any grab bars or handrails.  First and last steps.  Where the edge of each step is.  Use tools that help you move around (mobility aids) if they are needed. These include:  Canes.  Walkers.  Scooters.  Crutches.  Turn on the lights when you go into a dark area. Replace any light bulbs as soon as they burn out.  Set up your furniture so you have a clear path. Avoid moving your furniture around.  If any of your floors are uneven, fix them.  If there are any pets around you, be aware of where they are.  Review your medicines with your doctor. Some medicines can make you feel dizzy. This can increase your chance of falling. Ask your doctor what other things that you can do to help prevent falls. This information is not intended to replace advice given to you by your health care provider. Make sure you discuss any questions you have with your health care provider. Document Released: 03/30/2009 Document Revised: 11/09/2015 Document Reviewed: 07/08/2014 Elsevier Interactive Patient Education  2017 [employer].

## 2019-06-09 NOTE — Progress Notes (Signed)
This visit occurred during the SARS-CoV-2 public health emergency.  Safety protocols were in place, including screening questions prior to the visit, additional usage of staff PPE, and extensive cleaning of exam room while observing appropriate contact time as indicated for disinfecting solutions.  Subjective:   Tracey Castro is a 72 y.o. female who presents for Medicare Annual (Subsequent) preventive examination.  Review of Systems:  n/a Cardiac Risk Factors include: advanced age (>71mn, >>43women)     Objective:     Vitals: BP 118/62 (BP Location: Left Arm, Patient Position: Sitting, Cuff Size: Normal)   Pulse 94   Temp 97.9 F (36.6 C) (Oral)   Ht _0  (1.549 m)   Wt 147 lb (66.7 kg)   SpO2 94%   BMI 27.78 kg/m   Body mass index is 27.78 kg/m.  Advanced Directives 06/09/2019 06/03/2018 03/27/2018  Does Patient Have a Medical Advance Directive? Yes Yes Yes  Type of AParamedicof [place]Living will [place] will Living will  Does patient want to make changes to medical advance directive? - No - Patient declined -  Copy of [place] Chart? No - copy requested No - copy requested -    Tobacco Social History   Tobacco Use  Smoking Status Never Smoker  Smokeless Tobacco Never Used     Counseling given: Not Answered   Clinical Intake:  Pre-visit preparation completed: Yes  Pain : No/denies pain     Nutritional Status: BMI 25 -29 Overweight Nutritional Risks: None Diabetes: No  How often do you need to have someone help you when you read instructions, pamphlets, or other written materials from your doctor or pharmacy?: 1 - Never What is the last grade level you completed in school?: master's degree + 30  Interpreter Needed?: No  Information entered by :: NAllen LPN  Past Medical History:  Diagnosis Date  . Hyperlipidemia   . 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 BREAST CYST ASPIRATION    . VIDEO BRONCHOSCOPY Bilateral 03/27/2018   Procedure: VIDEO BRONCHOSCOPY WITHOUT FLUORO;  Surgeon: WTanda Rockers MD;  [facility]  Service: Cardiopulmonary;  Laterality: Bilateral;   Family History  Problem Relation Age of Onset  . Cancer - Ovarian Mother   . Cancer - Colon Father   . Parkinson's disease Father    Social History   Socioeconomic History  . Marital status: Married    Spouse name: Not on file  . Number of children: Not on file  . Years of education: Not on file  . Highest education level: Not on file  Occupational History  . Occupation: retired  Tobacco Use  . Smoking status: Never Smoker  . Smokeless tobacco: Never Used  Substance and Sexual Activity  . Alcohol use: Yes    Alcohol/week: 4.0 standard drinks    Types: 4 Glasses of wine per week  . Drug use: Never  . Sexual activity: Yes  Other Topics Concern  . Not on file  Social History Narrative  . Not on file   Social Determinants of Health   Financial Resource Strain: Low Risk   . Difficulty of Paying Living Expenses: Not hard at all  Food Insecurity: No Food Insecurity  . Worried About RCharity fundraiserin the Last Year: Never true  . Ran Out of Food in the Last Year: Never true  Transportation Needs: No Transportation Needs  .  Lack of Transportation (Medical): No  . Lack of Transportation (Non-Medical): No  Physical Activity: Insufficiently Active  . Days of Exercise per Week: 3 days  . Minutes of Exercise per Session: 40 min  Stress: No Stress Concern Present  . Feeling of Stress : Not at all  Social Connections:   . Frequency of Communication with Friends and Family: Not on file  . Frequency of Social Gatherings with Friends and Family: Not on file  . Attends Religious Services: Not on file  . Active Member of Clubs or Organizations: Not on file  . Attends Archivist Meetings: Not on file    . Marital Status: Not on file    Outpatient Encounter Medications as of 06/09/2019  Medication Sig  . BREO ELLIPTA 100-25 MCG/INH AEPB INHALE 1 PUFF INTO THE LUNGS ONCE FOR 1 DOSE.  Marland Kitchen Cholecalciferol (VITAMIN D) 2000 units tablet Take 4,000-6,000 Units by mouth daily.  . diphenhydrAMINE HCl, Sleep, (SLEEP AID) 25 MG CAPS Take by mouth. prn  . estradiol (ESTRACE) 0.1 MG/GM vaginal cream USE ONCE A NIGHT FOR 2 WEEKS, THEN SPACE OUT TO TWICE A WEEK  . fluticasone (FLONASE) 50 MCG/ACT nasal spray Place 1 spray into the nose daily as needed for allergies.   Marland Kitchen ibuprofen (ADVIL,MOTRIN) 200 MG tablet Take 400 mg by mouth 2 (two) times daily as needed for headache or moderate pain.  . Magnesium 500 MG CAPS Take by mouth.  . Multiple Vitamin (MULTIVITAMIN) capsule Take 1 capsule by mouth daily.  . vitamin B-12 (CYANOCOBALAMIN) 1000 MCG tablet Take 1,000 mcg by mouth daily. 1-2 times a week  . pneumococcal 13-valent conjugate vaccine (PREVNAR 13) SUSP injection Inject 0.5 mLs into the muscle tomorrow at 10 am for 1 dose.  . Thiamine HCl (VITAMIN B-1) 250 MG tablet Take 250 mg by mouth daily.   No facility-administered encounter medications on file as of 06/09/2019.    Activities of Daily Living In your present state of health, do you have any difficulty performing the following activities: 06/09/2019  Hearing? Y  Comment bilateral decrease in hearing, tv is louder  Vision? N  Difficulty concentrating or making decisions? N  Walking or climbing stairs? N  Dressing or bathing? N  Doing errands, shopping? N  Preparing Food and eating ? N  Using the Toilet? N  In the past six months, have you accidently leaked urine? Y  Comment with sneezing and cough  Do you have problems with loss of bowel control? N  Managing your Medications? N  Managing your Finances? N  Housekeeping or managing your Housekeeping? N  Some recent data might be hidden    Patient Care Team: Glendale Chard, MD as PCP -  General (Internal Medicine)    Assessment:   This is a routine wellness examination for Tracey Castro.  Exercise Activities and Dietary recommendations Current Exercise Habits: Home exercise routine, Type of exercise: walking, Time (Minutes): 40, Frequency (Times/Week): 3, Weekly Exercise (Minutes/Week): 120  Goals    . exercise regularly (pt-stated)    . Weight (lb) < 200 lb (90.7 kg) (pt-stated)     Would like to lose 5 pounds    . Weight (lb) < 200 lb (90.7 kg)     06/09/2019, wants to lose 5-10 pounds       Fall Risk Fall Risk  06/09/2019 11/10/2018 06/03/2018 06/03/2018 02/19/2018  Falls in the past year? 0 0 0 0 No  Comment - - - - -  Follow up Falls evaluation  completed;Education provided;Falls prevention discussed - - - -   Is the patient's home free of loose throw rugs in walkways, pet beds, electrical cords, etc?   yes      Grab bars in the bathroom? yes      Handrails on the stairs?   yes      Adequate lighting?   yes  Timed Get Up and Go performed: n/a  Depression Screen PHQ 2/9 Scores 06/09/2019 11/10/2018 06/03/2018 06/03/2018  PHQ - 2 Score 0 0 0 0  PHQ- 9 Score 3 - - -     Cognitive Function     6CIT Screen 06/09/2019  What Year? 0 points  What month? 0 points  What time? 0 points  Count back from 20 0 points  Months in reverse 0 points  Repeat phrase 0 points  Total Score 0    Immunization History  Administered Date(s) Administered  . Fluad Quad(high Dose 65+) 03/18/2019  . Influenza, High Dose Seasonal PF 06/03/2018  . Influenza-Unspecified 04/17/2013    Qualifies for Shingles Vaccine? yes  Screening Tests Health Maintenance  Topic Date Due  . PNA vac Low Risk Adult (2 of 2 - PPSV23) 03/27/2016  . MAMMOGRAM  08/20/2020  . COLONOSCOPY  02/27/2023  . TETANUS/TDAP  02/27/2023  . INFLUENZA VACCINE  Completed  . DEXA SCAN  Completed  . Hepatitis C Screening  Completed    Cancer Screenings: Lung: Low Dose CT Chest recommended if Age 28-80  years, 30 pack-year currently smoking OR have quit w/in 15years. Patient does not qualify. Breast:  Up to date on Mammogram? Yes   Up to date of Bone Density/Dexa? Yes Colorectal: up to date  Additional Screenings: : Hepatitis C Screening: 06/04/2018     Plan:    Patient would like to lose 5-10 pounds.   I have personally reviewed and noted the following in the patient's chart:   . Medical and social history . Use of alcohol, tobacco or illicit drugs  . Current medications and supplements . Functional ability and status . Nutritional status . Physical activity . Advanced directives . List of other physicians . Hospitalizations, surgeries, and ER visits in previous 12 months . Vitals . Screenings to include cognitive, depression, and falls . Referrals and appointments  In addition, I have reviewed and discussed with patient certain preventive protocols, quality metrics, and best practice recommendations. A written personalized care plan for preventive services as well as general preventive health recommendations were provided to patient.     Kellie Simmering, LPN  [id number]

## 2019-06-09 NOTE — Patient Instructions (Signed)
Metabolic Syndrome Metabolic syndrome occurs when you have a combination of three or more factors that increase your chances of developing cardiovascular disease and diabetes. These factors include:  High fasting blood sugar (glucose).  High blood triglyceride level.  High blood pressure.  Low levels of high-density lipoprotein (HDL) blood cholesterol.  Having a waist measurement that is: ? More than 40 inches in men. ? More than 35 inches in women. Metabolic syndrome is sometimes called insulin resistance syndrome or syndrome X. What are the causes? The exact cause of this condition is not known. It may be related to a combination of the factors that were passed down from your parents (genes) and things that you do, eat, and drink (lifestyle choices). What increases the risk? You are more likely to develop this condition if you:  Eat a diet high in calories and saturated fat.  Do not exercise regularly.  Are obese.  Have a family history of type 2 diabetes mellitus.  Have insulin resistance.  Have a history of gestational diabetes during a previous pregnancy.  Have conditions such as cardiovascular disease, nonalcoholic fatty liver disease, or polycystic ovary syndrome (PCOS).  Are older. The risk increases with age.  Use any tobacco products, including cigarettes, chewing tobacco, or e-cigarettes. What are the signs or symptoms? Metabolic syndrome has no specific symptoms. Having abnormal blood test results may be the only signs of metabolic syndrome. How is this diagnosed? This condition may be diagnosed based on:  Your blood pressure measurements.  Your waist measurement.  Blood tests.  Your personal and family medical history. How is this treated? Treatment may include:  Lifestyle changes to reduce your risk for heart disease, stroke, and diabetes, such as: ? Exercise. ? Weight loss. ? Eating a healthy diet. ? Stopping tobacco and nicotine  use.  Medicines that: ? Help your body maintain normal blood glucose levels. ? Lower your blood pressure and your blood triglyceride levels. Follow these instructions at home:      Take over-the-counter and prescription medicines only as told by your health care provider.  Exercise regularly, as told by your health care provider.  Eat a healthy diet that includes fresh fruits and vegetables, whole grains, lean proteins, and low-fat or nonfat dairy products.  Maintain a healthy weight. Work with your health care provider to lose weight safely, if needed.  Do not use any products that contain nicotine or tobacco, such as cigarettes and e-cigarettes. If you need help quitting, ask your health care provider.  If directed, measure your waist regularly and write down the measurements. To measure your waist: ? Stand up straight. ? Breathe out. ? Wrap a measuring tape around the part of your waist that is just above your hip bones. ? Read and write down the measurement.  Keep all follow-up visits as told by your health care provider. This is important. Contact a health care provider if:  You feel very tired.  You are extremely thirsty.  You urinate a lot more than usual.  Your waist gets bigger.  You have headaches that do not go away. Get help right away if:  You suddenly develop any of the following: ? Dizziness. ? Blurry vision. ? Trouble speaking. ? Trouble swallowing. ? Weakness in an arm or leg. ? Chest pain. ? Trouble breathing.  Your heartbeat feels abnormal.  You faint. Summary  Metabolic syndrome occurs when you have a combination of three or more factors that increase your chances of developing cardiovascular disease and  diabetes.  These factors include a high fasting blood sugar (glucose), high blood triglyceride level, high blood pressure, low levels of high-density lipoprotein (HDL) blood cholesterol, and a waist measurement that is more than 40 inches in  men or more than 35 inches in women.  Metabolic syndrome has no specific symptoms. Having abnormal blood test results may be the only signs of metabolic syndrome.  Treatment may include lifestyle changes and medicine to reduce your risk for heart disease, stroke, and diabetes. This information is not intended to replace advice given to you by your health care provider. Make sure you discuss any questions you have with your health care provider. Document Released: 09/10/2007 Document Revised: 06/16/2017 Document Reviewed: 06/16/2017 Elsevier Patient Education  2020 [employer].

## 2019-06-10 ENCOUNTER — Ambulatory Visit: Payer: Medicare Other

## 2019-06-12 LAB — SPECIMEN STATUS REPORT

## 2019-06-12 LAB — ANA W/REFLEX: Anti Nuclear Antibody (ANA): NEGATIVE

## 2019-06-12 LAB — TSH: TSH: 3.13 u[IU]/mL (ref 0.450–4.500)

## 2019-06-13 NOTE — Progress Notes (Signed)
This visit occurred during the SARS-CoV-2 public health emergency.  Safety protocols were in place, including screening questions prior to the visit, additional usage of staff PPE, and extensive cleaning of exam room while observing appropriate contact time as indicated for disinfecting solutions.  Subjective:     Patient ID: Tracey Castro , female    DOB: Aug 28, 1945 , 73 y.o.   MRN: [id number]   Chief Complaint  Patient presents with  . abnormal glucose    HPI  She is here today for f/u pre-diabetes. She admits that she has not been exercising as much as she has in the past. She had her labwork drawn last week. She has yet to review results.  She is also concerned about hair shedding that occurred earlier in the year. Her sx have resolved. She wants to know if there is something that caused this.    Past Medical History:  Diagnosis Date  . Hyperlipidemia   . Mycobacterium avium complex ([place])      Family History  Problem Relation Age of Onset  . Cancer - Ovarian Mother   . Cancer - Colon Father   . Parkinson's disease Father      Current Outpatient Medications:  .  BREO ELLIPTA 100-25 MCG/INH AEPB, INHALE 1 PUFF INTO THE LUNGS ONCE FOR 1 DOSE., Disp: 3 each, Rfl: 1 .  Cholecalciferol (VITAMIN D) 2000 units tablet, Take 4,000-6,000 Units by mouth daily., Disp: , Rfl:  .  diphenhydrAMINE HCl, Sleep, (SLEEP AID) 25 MG CAPS, Take by mouth. prn, Disp: , Rfl:  .  estradiol (ESTRACE) 0.1 MG/GM vaginal cream, USE ONCE A NIGHT FOR 2 WEEKS, THEN SPACE OUT TO TWICE A WEEK, Disp: , Rfl:  .  fluticasone (FLONASE) 50 MCG/ACT nasal spray, Place 1 spray into the nose daily as needed for allergies. , Disp: , Rfl:  .  ibuprofen (ADVIL,MOTRIN) 200 MG tablet, Take 400 mg by mouth 2 (two) times daily as needed for headache or moderate pain., Disp: , Rfl:  .  Magnesium 500 MG CAPS, Take by mouth., Disp: , Rfl:  .  Multiple Vitamin (MULTIVITAMIN) capsule, Take 1 capsule by mouth daily., Disp: ,  Rfl:  .  Thiamine HCl (VITAMIN B-1) 250 MG tablet, Take 250 mg by mouth daily., Disp: , Rfl:  .  vitamin B-12 (CYANOCOBALAMIN) 1000 MCG tablet, Take 1,000 mcg by mouth daily. 1-2 times a week, Disp: , Rfl:    Allergies  Allergen Reactions  . Sulfamethoxazole Rash     Review of Systems  Constitutional: Negative.   Respiratory: Negative.   Cardiovascular: Negative.   Gastrointestinal: Negative.   Neurological: Negative.   Psychiatric/Behavioral: Negative.      Today's Vitals   06/09/19 1057  BP: 118/62  Pulse: 94  Temp: 97.9 F (36.6 C)  TempSrc: Oral  Weight: 147 lb (66.7 kg)  Height: _0  (1.549 m)   Body mass index is 27.78 kg/m.   Objective:  Physical Exam Vitals and nursing note reviewed.  Constitutional:      Appearance: Normal appearance.  HENT:     Head: Normocephalic and atraumatic.  Cardiovascular:     Rate and Rhythm: Normal rate and regular rhythm.     Heart sounds: Normal heart sounds.  Pulmonary:     Effort: Pulmonary effort is normal.     Breath sounds: Normal breath sounds.  Skin:    General: Skin is warm.  Neurological:     General: No focal deficit present.     Mental  Status: She is alert.  Psychiatric:        Mood and Affect: Mood normal.        Behavior: Behavior normal.         Assessment And Plan:     1. Other abnormal glucose  Her labs were drawn last week and reviewed with patient in full detail. Hba1c 6.2, goal is less than 5.6. She is encouraged to strive for 150 minutes of regular exercise per week.   2. Pure hypercholesterolemia  Recent lipid with LDL 112, goal less than 100. She is encouraged to avoid fried foods, exercise regularly and increase her fish intake.   3. Hair loss  Resolved. TSH is within normal limits. She may benefit from biotin supplementation.   4. Immunization due  She was given rx Shingrix vaccine. Advised to complete course (both shots) prior to getting COVID-19 vaccine.   5. Family history of  colon cancer  - Ambulatory referral to [facility]  6. Screen for colon cancer  - Ambulatory referral to [place], [place]    THE PATIENT IS ENCOURAGED TO PRACTICE SOCIAL DISTANCING DUE TO THE COVID-19 PANDEMIC.

## 2019-07-08 ENCOUNTER — Ambulatory Visit: Payer: Medicare Other | Attending: Internal Medicine

## 2019-07-08 DIAGNOSIS — Z23 Encounter for immunization: Secondary | ICD-10-CM | POA: Insufficient documentation

## 2019-07-08 NOTE — Progress Notes (Deleted)
   Covid-19 Vaccination Clinic  Name:  Kawther Poppell    MRN: [id number] DOB: 13-Aug-1945  07/08/2019  [name] was observed post Covid-19 immunization for 15 minutes without incidence. She was provided with Vaccine Information Sheet and instruction to access the V-Safe system.   Ms. Distler was instructed to call 911 with any severe reactions post vaccine: Marland Kitchen Difficulty breathing  . Swelling of your face and throat  . A fast heartbeat  . A bad rash all over your body  . Dizziness and weakness    Immunizations Administered    Name Date Dose VIS Date Route   Pfizer COVID-19 Vaccine 07/08/2019  9:10 AM 0.3 mL 05/28/2019 Intramuscular   Manufacturer: [place]   Lot: [id number]   [place]: [id number]

## 2019-07-08 NOTE — Progress Notes (Signed)
   Covid-19 Vaccination Clinic  Name:  Tracey Castro    MRN: [id number] DOB: 19-Mar-1946  07/08/2019  [name] was observed post Covid-19 immunization for 15 minutes without incidence. She was provided with Vaccine Information Sheet and instruction to access the V-Safe system.   [name] was instructed to call 911 with any severe reactions post vaccine: Marland Kitchen Difficulty breathing  . Swelling of your face and throat  . A fast heartbeat  . A bad rash all over your body  . Dizziness and weakness    Immunizations Administered    Name Date Dose VIS Date Route   Pfizer COVID-19 Vaccine 07/08/2019  9:10 AM 0.3 mL 05/28/2019 Intramuscular   Manufacturer: [place]   Lot: [id number]   [place]: [id number]

## 2019-07-22 DIAGNOSIS — Z800 Family history of malignant neoplasm of digestive organs: Secondary | ICD-10-CM | POA: Diagnosis not present

## 2019-07-22 DIAGNOSIS — K5730 Diverticulosis of large intestine without perforation or abscess without bleeding: Secondary | ICD-10-CM | POA: Diagnosis not present

## 2019-07-22 DIAGNOSIS — Z1211 Encounter for screening for malignant neoplasm of colon: Secondary | ICD-10-CM | POA: Diagnosis not present

## 2019-07-22 DIAGNOSIS — Z86010 Personal history of colonic polyps: Secondary | ICD-10-CM | POA: Diagnosis not present

## 2019-07-28 ENCOUNTER — Other Ambulatory Visit: Payer: Self-pay | Admitting: Internal Medicine

## 2019-07-28 DIAGNOSIS — Z1231 Encounter for screening mammogram for malignant neoplasm of breast: Secondary | ICD-10-CM

## 2019-07-29 ENCOUNTER — Ambulatory Visit: Payer: Medicare Other | Attending: Internal Medicine

## 2019-07-29 DIAGNOSIS — Z23 Encounter for immunization: Secondary | ICD-10-CM

## 2019-07-29 NOTE — Progress Notes (Signed)
   Covid-19 Vaccination Clinic  Name:  Tracey Castro    MRN: [id number] DOB: 09-26-1945  07/29/2019  Tracey Castro was observed post Covid-19 immunization for 15 minutes without incidence. She was provided with Vaccine Information Sheet and instruction to access the V-Safe system.   Tracey Castro was instructed to call 911 with any severe reactions post vaccine: Marland Kitchen Difficulty breathing  . Swelling of your face and throat  . A fast heartbeat  . A bad rash all over your body  . Dizziness and weakness    Immunizations Administered    Name Date Dose VIS Date Route   Pfizer COVID-19 Vaccine 07/29/2019  9:33 AM 0.3 mL 05/28/2019 Intramuscular   Manufacturer: [place]   Lot: [id number]   [place]: [id number]

## 2019-09-03 ENCOUNTER — Other Ambulatory Visit: Payer: Self-pay

## 2019-09-03 ENCOUNTER — Ambulatory Visit
Admission: RE | Admit: 2019-09-03 | Discharge: 2019-09-03 | Disposition: A | Discharge status: Home / Self Care | Payer: Medicare Other | Source: Ambulatory Visit | Attending: Internal Medicine | Admitting: Internal Medicine

## 2019-09-03 DIAGNOSIS — Z1231 Encounter for screening mammogram for malignant neoplasm of breast: Secondary | ICD-10-CM

## 2019-09-06 ENCOUNTER — Encounter: Payer: Self-pay | Admitting: Pulmonary Disease

## 2019-09-06 ENCOUNTER — Ambulatory Visit (INDEPENDENT_AMBULATORY_CARE_PROVIDER_SITE_OTHER): Payer: Medicare Other

## 2019-09-06 ENCOUNTER — Ambulatory Visit (INDEPENDENT_AMBULATORY_CARE_PROVIDER_SITE_OTHER): Payer: Medicare Other | Admitting: Pulmonary Disease

## 2019-09-06 ENCOUNTER — Other Ambulatory Visit: Payer: Self-pay

## 2019-09-06 VITALS — BP 110/70 | HR 71 | Temp 97.4°F | Ht 62.0 in | Wt 151.4 lb

## 2019-09-06 DIAGNOSIS — J479 Bronchiectasis, uncomplicated: Secondary | ICD-10-CM

## 2019-09-06 DIAGNOSIS — J449 Chronic obstructive pulmonary disease, unspecified: Secondary | ICD-10-CM | POA: Diagnosis not present

## 2019-09-06 MED ORDER — BREO ELLIPTA 100-25 MCG/INH IN AEPB: 1.0000 | INHALATION_SPRAY | Freq: Every day | RESPIRATORY_TRACT | 3 refills | Status: DC

## 2019-09-06 NOTE — Progress Notes (Signed)
   Subjective:    Patient ID: Tracey Castro, female    DOB: Jan 08, 1946, 74 y.o.   MRN: [id number]  HPI  74 year old never smoker for follow-up of bronchiectasis and chronic cough Although never smoker, recurrent spirometry have shown airway obstruction,this may be related to bronchiectasis. Clinical history is not consistent with asthma  Chief Complaint  Patient presents with  . Follow-up    Patient is here for follow up for bronchiectasis. Patient changed her inhalers to [facility]. Patient has shortness of breath with exertion. Patient has a cough with yellow/grey sputum says that she is feeling some congestion in her chest from allergies.   Complains of stable shortness of breath with exertion.  Wonders if she is getting the medication when she uses the [facility].  [place] able [place].  No interim flareups, last office visit from 03/2019 was reviewed.  We also reviewed CT and prior PFTs  Significant tests/ events reviewed   CT chest 8/2019Focal dense chronic appearing consolidations within the lingula and RIGHT middle lobe, with associated traction bronchiectasis, Quant TB 02/19/18 neg  - Quant Ig's 03/18/2018 nl  - Alpha one AT screen 03/18/18 MM, level 128  - FOB 03/27/2018 Minimal secretions>>>BAL lingula >>>Neg cytology, Neg afb, fungal  Allergy profile 03/18/2018 >Eos 0.1 / IgE 4 RAST neg   Spirometry 03/18/2018 FEV1 1.0 (49%) Ratio 70  - PFT's 05/08/2018 FEV1 1.30 (65 % ) ratio65p 14 % improvement,DLCO 67 % corrects to 91 % for alv volume -  Spirometry 08/2018 worsening airway obstruction with ratio of 69, FEV1 52% FVC 57%  Review of Systems     Objective:   Physical Exam  Gen. Pleasant, elderly, well-nourished, in no distress ENT - no thrush, no pallor/icterus,no post nasal drip Neck: No JVD, no thyromegaly, no carotid bruits Lungs: no use of accessory muscles, no dullness to percussion, left axillary rales  no rhonchi  Cardiovascular: Rhythm regular, heart sounds  normal, no murmurs or gallops, no peripheral edema Musculoskeletal: No deformities, no cyanosis or clubbing         Assessment & Plan:

## 2019-09-06 NOTE — Assessment & Plan Note (Signed)
Schedule PFTs - will need Covid testing prior.  Stay on Breo -prescription for 3 months with 3 refills sent to [facility]

## 2019-09-06 NOTE — Assessment & Plan Note (Addendum)
Chest x-ray today shows changes of bronchiectasis in right middle and lingula as prior, stable We will repeat CT imaging if she is clinically worse  Call us if she gets signs and symptoms of bronchitis

## 2019-09-06 NOTE — Patient Instructions (Addendum)
Chest x-ray today Schedule PFTs -you will need Covid testing prior.  Stay on Breo -prescription for 3 months with 3 refills sent to [facility]  Call us if he gets signs and symptoms of bronchitis

## 2019-10-01 DIAGNOSIS — D125 Benign neoplasm of sigmoid colon: Secondary | ICD-10-CM | POA: Diagnosis not present

## 2019-10-01 DIAGNOSIS — K5730 Diverticulosis of large intestine without perforation or abscess without bleeding: Secondary | ICD-10-CM | POA: Diagnosis not present

## 2019-10-01 DIAGNOSIS — Z86010 Personal history of colonic polyps: Secondary | ICD-10-CM | POA: Diagnosis not present

## 2019-10-01 DIAGNOSIS — Z1211 Encounter for screening for malignant neoplasm of colon: Secondary | ICD-10-CM | POA: Diagnosis not present

## 2019-10-01 DIAGNOSIS — K635 Polyp of colon: Secondary | ICD-10-CM | POA: Diagnosis not present

## 2019-10-01 DIAGNOSIS — Z800 Family history of malignant neoplasm of digestive organs: Secondary | ICD-10-CM | POA: Diagnosis not present

## 2019-10-01 LAB — HM COLONOSCOPY

## 2019-10-14 ENCOUNTER — Encounter: Payer: Self-pay | Admitting: Internal Medicine

## 2019-10-25 DIAGNOSIS — M25562 Pain in left knee: Secondary | ICD-10-CM | POA: Diagnosis not present

## 2019-10-25 DIAGNOSIS — M1611 Unilateral primary osteoarthritis, right hip: Secondary | ICD-10-CM | POA: Diagnosis not present

## 2019-10-25 DIAGNOSIS — M25551 Pain in right hip: Secondary | ICD-10-CM | POA: Diagnosis not present

## 2019-10-28 ENCOUNTER — Telehealth: Payer: Self-pay | Admitting: Pulmonary Disease

## 2019-10-28 MED ORDER — BREO ELLIPTA 100-25 MCG/INH IN AEPB: 1.0000 | INHALATION_SPRAY | Freq: Every day | RESPIRATORY_TRACT | 3 refills | Status: DC

## 2019-10-28 NOTE — Telephone Encounter (Signed)
Checked pt's med list and I see that when pt's breo was sent to pharmacy on 09/06/19 after last OV, the pharmacy we have listed that it was sent to was [facility].  Attempted to call pt but unable to reach. Left message for her to return call.

## 2019-10-28 NOTE — Telephone Encounter (Signed)
Spoke with pt, [facility].  Nothing further needed at this time- will close encounter.

## 2019-10-28 NOTE — Telephone Encounter (Signed)
Pt returning a phone call. Pt can be reached at [phone]

## 2019-12-06 ENCOUNTER — Other Ambulatory Visit (HOSPITAL_COMMUNITY)
Admission: RE | Admit: 2019-12-06 | Discharge: 2019-12-06 | Disposition: A | Discharge status: Home / Self Care | Payer: Medicare Other | Source: Ambulatory Visit | Attending: Pulmonary Disease | Admitting: Pulmonary Disease

## 2019-12-06 DIAGNOSIS — Z01812 Encounter for preprocedural laboratory examination: Secondary | ICD-10-CM | POA: Insufficient documentation

## 2019-12-06 DIAGNOSIS — Z20822 Contact with and (suspected) exposure to covid-19: Secondary | ICD-10-CM | POA: Diagnosis not present

## 2019-12-06 LAB — SARS CORONAVIRUS 2 (TAT 6-24 HRS): SARS Coronavirus 2: NEGATIVE

## 2019-12-08 ENCOUNTER — Ambulatory Visit: Payer: Medicare Other | Admitting: Internal Medicine

## 2020-01-05 ENCOUNTER — Ambulatory Visit (INDEPENDENT_AMBULATORY_CARE_PROVIDER_SITE_OTHER): Payer: Medicare Other | Admitting: Internal Medicine

## 2020-01-05 ENCOUNTER — Encounter: Payer: Self-pay | Admitting: Internal Medicine

## 2020-01-05 ENCOUNTER — Other Ambulatory Visit: Payer: Self-pay

## 2020-01-05 VITALS — BP 102/64 | HR 78 | Temp 97.5°F | Ht 62.0 in | Wt 148.0 lb

## 2020-01-05 DIAGNOSIS — M25473 Effusion, unspecified ankle: Secondary | ICD-10-CM

## 2020-01-05 DIAGNOSIS — N393 Stress incontinence (female) (male): Secondary | ICD-10-CM | POA: Diagnosis not present

## 2020-01-05 DIAGNOSIS — R7309 Other abnormal glucose: Secondary | ICD-10-CM

## 2020-01-05 DIAGNOSIS — Z6827 Body mass index (BMI) 27.0-27.9, adult: Secondary | ICD-10-CM | POA: Diagnosis not present

## 2020-01-05 DIAGNOSIS — E663 Overweight: Secondary | ICD-10-CM

## 2020-01-05 DIAGNOSIS — E7800 Pure hypercholesterolemia, unspecified: Secondary | ICD-10-CM | POA: Diagnosis not present

## 2020-01-05 DIAGNOSIS — E559 Vitamin D deficiency, unspecified: Secondary | ICD-10-CM | POA: Diagnosis not present

## 2020-01-05 DIAGNOSIS — M1611 Unilateral primary osteoarthritis, right hip: Secondary | ICD-10-CM

## 2020-01-05 LAB — POCT URINALYSIS DIPSTICK
Bilirubin, UA: NEGATIVE
Blood, UA: NEGATIVE
Glucose, UA: NEGATIVE
Ketones, UA: NEGATIVE
Leukocytes, UA: NEGATIVE
Nitrite, UA: NEGATIVE
Protein, UA: NEGATIVE
Spec Grav, UA: >=1.03 — AB (ref 1.010–1.025)
Urobilinogen, UA: 0.2 E.U./dL
pH, UA: 5.5 (ref 5.0–8.0)

## 2020-01-05 NOTE — Patient Instructions (Signed)
KEGEL EXERCISES   Urinary Incontinence  Urinary incontinence refers to a condition in which a person is unable to control where and when to pass urine. A person with this condition will urinate when he or she does not mean to (involuntarily). What are the causes? This condition may be caused by:  Medicines.  Infections.  Constipation.  Overactive bladder muscles.  Weak bladder muscles.  Weak pelvic floor muscles. These muscles provide support for the bladder, intestine, and, in women, the uterus.  Enlarged prostate in men. The prostate is a gland near the bladder. When it gets too big, it can pinch the urethra. With the urethra blocked, the bladder can weaken and lose the ability to empty properly.  Surgery.  Emotional factors, such as anxiety, stress, or post-traumatic stress disorder (PTSD).  Pelvic organ prolapse. This happens in women when organs shift out of place and into the vagina. This shift can prevent the bladder and urethra from working properly. What increases the risk? The following factors may make you more likely to develop this condition:  Older age.  Obesity and physical inactivity.  Pregnancy and childbirth.  Menopause.  Diseases that affect the nerves or spinal cord (neurological diseases).  Long-term (chronic) coughing. This can increase pressure on the bladder and pelvic floor muscles. What are the signs or symptoms? Symptoms may vary depending on the type of urinary incontinence you have. They include:  A sudden urge to urinate, but passing urine involuntarily before you can get to a bathroom (urge incontinence).  Suddenly passing urine with any activity that forces urine to pass, such as coughing, laughing, exercise, or sneezing (stress incontinence).  Needing to urinate often, but urinating only a small amount, or constantly dribbling urine (overflow incontinence).  Urinating because you cannot get to the bathroom in time due to a physical  disability, such as arthritis or injury, or communication and thinking problems, such as Alzheimer disease (functional incontinence). How is this diagnosed? This condition may be diagnosed based on:  Your medical history.  A physical exam.  Tests, such as: ? Urine tests. ? X-rays of your kidney and bladder. ? Ultrasound. ? CT scan. ? Cystoscopy. In this procedure, a health care provider inserts a tube with a light and camera (cystoscope) through the urethra and into the bladder in order to check for problems. ? Urodynamic testing. These tests assess how well the bladder, urethra, and sphincter can store and release urine. There are different types of urodynamic tests, and they vary depending on what the test is measuring. To help diagnose your condition, your health care provider may recommend that you keep a log of when you urinate and how much you urinate. How is this treated? Treatment for this condition depends on the type of incontinence that you have and its cause. Treatment may include:  Lifestyle changes, such as: ? Quitting smoking. ? Maintaining a healthy weight. ? Staying active. Try to get 150 minutes of moderate-intensity exercise every week. Ask your health care provider which activities are safe for you. ? Eating a healthy diet.  Avoid high-fat foods, like fried foods.  Avoid refined carbohydrates like white bread and white rice.  Limit how much alcohol and caffeine you drink.  Increase your fiber intake. Foods such as fresh fruits, vegetables, beans, and whole grains are healthy sources of fiber.  Pelvic floor muscle exercises.  Bladder training, such as lengthening the amount of time between bathroom breaks, or using the bathroom at regular intervals.  Using techniques  to suppress bladder urges. This can include distraction techniques or controlled breathing exercises.  Medicines to relax the bladder muscles and prevent bladder spasms.  Medicines to help slow  or prevent the growth of a man's prostate.  Botox injections. These can help relax the bladder muscles.  Using pulses of electricity to help change bladder reflexes (electrical nerve stimulation).  For women, using a medical device to prevent urine leaks. This is a small, tampon-like, disposable device that is inserted into the urethra.  Injecting collagen or carbon beads (bulking agents) into the urinary sphincter. These can help thicken tissue and close the bladder opening.  Surgery. Follow these instructions at home: Lifestyle  Limit alcohol and caffeine. These can fill your bladder quickly and irritate it.  Keep yourself clean to help prevent odors and skin damage. Ask your doctor about special skin creams and cleansers that can protect the skin from urine.  Consider wearing pads or adult diapers. Make sure to change them regularly, and always change them right after experiencing incontinence. General instructions  Take over-the-counter and prescription medicines only as told by your health care provider.  Use the bathroom about every 3-4 hours, even if you do not feel the need to urinate. Try to empty your bladder completely every time. After urinating, wait a minute. Then try to urinate again.  Make sure you are in a relaxed position while urinating.  If your incontinence is caused by nerve problems, keep a log of the medicines you take and the times you go to the bathroom.  Keep all follow-up visits as told by your health care provider. This is important. Contact a health care provider if:  You have pain that gets worse.  Your incontinence gets worse. Get help right away if:  You have a fever or chills.  You are unable to urinate.  You have redness in your groin area or down your legs. Summary  Urinary incontinence refers to a condition in which a person is unable to control where and when to pass urine.  This condition may be caused by medicines, infection, weak  bladder muscles, weak pelvic floor muscles, enlargement of the prostate (in men), or surgery.  The following factors increase your risk for developing this condition: older age, obesity, pregnancy and childbirth, menopause, neurological diseases, and chronic coughing.  There are several types of urinary incontinence. They include urge incontinence, stress incontinence, overflow incontinence, and functional incontinence.  This condition is usually treated first with lifestyle and behavioral changes, such as quitting smoking, eating a healthier diet, and doing regular pelvic floor exercises. Other treatment options include medicines, bulking agents, medical devices, electrical nerve stimulation, or surgery. This information is not intended to replace advice given to you by your health care provider. Make sure you discuss any questions you have with your health care provider. Document Revised: 06/13/2017 Document Reviewed: 09/12/2016 Elsevier Patient Education  [place].

## 2020-01-05 NOTE — Progress Notes (Signed)
This visit occurred during the SARS-CoV-2 public health emergency.  Safety protocols were in place, including screening questions prior to the visit, additional usage of staff PPE, and extensive cleaning of exam room while observing appropriate contact time as indicated for disinfecting solutions.  Subjective:     Patient ID: Tracey Castro , female    DOB: [birth year] , 74 y.o.   MRN: [id number]   Chief Complaint  Patient presents with  . Hyperlipidemia    HPI  She presents for cholesterol check today. She wants to have blood drawn tomorrow since she has eaten. She admits that she has not been exercising on a regular basis.   Hyperlipidemia This is a chronic problem. The current episode started more than 1 year ago. She is currently on no antihyperlipidemic treatment. Risk factors for coronary artery disease include dyslipidemia and post-menopausal.     Past Medical History:  Diagnosis Date  . Hyperlipidemia   . Mycobacterium avium complex ([place])      Family History  Problem Relation Age of Onset  . Cancer - Ovarian Mother   . Cancer - Colon Father   . Parkinson's disease Father      Current Outpatient Medications:  .  Cholecalciferol (VITAMIN D) 2000 units tablet, Take 4,000-6,000 Units by mouth daily., Disp: , Rfl:  .  diphenhydrAMINE HCl, Sleep, (SLEEP AID) 25 MG CAPS, Take by mouth. prn, Disp: , Rfl:  .  estradiol (ESTRACE) 0.1 MG/GM vaginal cream, USE ONCE A NIGHT FOR 2 WEEKS, THEN SPACE OUT TO TWICE A WEEK, Disp: , Rfl:  .  fluticasone (FLONASE) 50 MCG/ACT nasal spray, Place 1 spray into the nose daily as needed for allergies. , Disp: , Rfl:  .  fluticasone furoate-vilanterol (BREO ELLIPTA) 100-25 MCG/INH AEPB, Inhale 1 puff into the lungs daily., Disp: 180 each, Rfl: 3 .  ibuprofen (ADVIL,MOTRIN) 200 MG tablet, Take 400 mg by mouth 2 (two) times daily as needed for headache or moderate pain., Disp: , Rfl:  .  Magnesium 500 MG CAPS, Take by mouth., Disp: , Rfl:  .   Multiple Vitamin (MULTIVITAMIN) capsule, Take 1 capsule by mouth daily., Disp: , Rfl:    Allergies  Allergen Reactions  . Sulfamethoxazole Rash     Review of Systems  Constitutional: Negative.   Respiratory: Negative.   Cardiovascular: Positive for leg swelling.       She c/o b/l ankle swelling.  Denies sob. She is unable to state what triggers her sx. Denies b/l ankle pain. Denies fall/trauma. Admits to increased salt intake while on vacation.   Gastrointestinal: Negative.   Genitourinary: Positive for urgency.  Musculoskeletal: Positive for arthralgias.       She c/o r hip pain. There is some pain with ambulation. Has been seen by Cassie Freer, needs THR. Plans to get second opinion at Dr. Noemi Chapel.   Neurological: Negative.   Psychiatric/Behavioral: Negative.      Today's Vitals   01/05/20 1507  BP: 102/64  Pulse: 78  Temp: (!) 97.5 F (36.4 C)  TempSrc: Oral  Weight: 148 lb (67.1 kg)  Height: _0  (1.575 m)   Body mass index is 27.07 kg/m.   Objective:  Physical Exam Vitals and nursing note reviewed.  Constitutional:      Appearance: Normal appearance.  HENT:     Head: Normocephalic and atraumatic.  Cardiovascular:     Rate and Rhythm: Normal rate and regular rhythm.     Heart sounds: Normal heart sounds.  Pulmonary:  Effort: Pulmonary effort is normal.     Breath sounds: Normal breath sounds.  Musculoskeletal:     Right lower leg: No edema.     Left lower leg: No edema.  Skin:    General: Skin is warm.  Neurological:     General: No focal deficit present.     Mental Status: She is alert.  Psychiatric:        Mood and Affect: Mood normal.        Behavior: Behavior normal.         Assessment And Plan:     1. Pure hypercholesterolemia  Chronic, she wants to have her labwork drawn tomorrow, so she can be fasting. Therefore, today's labs will be cancelled. I will make further recommendations once her labs are available for review. Encouraged to  avoid fried foods and to incorporate more exercise into her daily routine.   - Lipid panel; Future - Lipid panel  2. Other abnormal glucose  HER A1C HAS BEEN ELEVATED IN THE PAST. I WILL CHECK AN A1C, BMET TODAY. SHE WAS ENCOURAGED TO AVOID SUGARY BEVERAGES AND PROCESSED FOODS INCLUDNG BREADS, RICE AND PASTA.  - Hemoglobin A1c; Future - Hemoglobin A1c  3. Ankle swelling, unspecified laterality  Comments: Not present today. Encouraged to elevate legs when seated and to stay well hydrated. She will let me know if her sx persist.   4. Primary osteoarthritis of right hip  Chronic. Encouraged to follow an anti-inflammatory diet. She may benefit from intra-articular injection. She is advised to continue with vitamin D supplementation.   5. Vitamin D deficiency disease  I WILL CHECK A VIT D LEVEL AND SUPPLEMENT AS NEEDED.  ALSO ENCOURAGED TO SPEND 15 MINUTES IN THE SUN DAILY.  - CMP14+EGFR; Future - VITAMIN D 25 Hydroxy (Vit-D Deficiency, Fractures); Future - VITAMIN D 25 Hydroxy (Vit-D Deficiency, Fractures) - CMP14+EGFR  6. Overweight with body mass index (BMI) of 27 to 27.9 in adult  Her BMI is stable for her demographics. Encouraged to aim for at least 150 minutes of exercise per week.   7. Stress incontinence  I will check a urinalysis to first r/o UTI. She was advised to practice Kegel exercises. If no relief, I will refer her for pelvic PT, if she is agreeable.  - POCT Urinalysis Dipstick (36468)     Patient was given opportunity to ask questions. Patient verbalized understanding of the plan and was able to repeat key elements of the plan. All questions were answered to their satisfaction.  Maximino Greenland, MD   I, Maximino Greenland, MD, have reviewed all documentation for this visit. The documentation on 01/09/20 for the exam, diagnosis, procedures, and orders are all accurate and complete.  THE PATIENT IS ENCOURAGED TO PRACTICE SOCIAL DISTANCING DUE TO THE COVID-19  PANDEMIC.

## 2020-01-06 DIAGNOSIS — E559 Vitamin D deficiency, unspecified: Secondary | ICD-10-CM | POA: Diagnosis not present

## 2020-01-06 DIAGNOSIS — E7800 Pure hypercholesterolemia, unspecified: Secondary | ICD-10-CM | POA: Diagnosis not present

## 2020-01-06 DIAGNOSIS — R7309 Other abnormal glucose: Secondary | ICD-10-CM | POA: Diagnosis not present

## 2020-01-06 LAB — LIPID PANEL
Chol/HDL Ratio: 3.8 ratio (ref 0.0–4.4)
Cholesterol, Total: 210 mg/dL — ABNORMAL HIGH (ref 100–199)
HDL: 55 mg/dL (ref >39)
LDL Chol Calc (NIH): 139 mg/dL — ABNORMAL HIGH (ref 0–99)
Triglycerides: 89 mg/dL (ref 0–149)
VLDL Cholesterol Cal: 16 mg/dL (ref 5–40)

## 2020-01-07 ENCOUNTER — Telehealth: Payer: Self-pay

## 2020-01-07 LAB — CMP14+EGFR
ALT: 16 IU/L (ref 0–32)
AST: 15 IU/L (ref 0–40)
Albumin/Globulin Ratio: 1.5 (ref 1.2–2.2)
Albumin: 4.1 g/dL (ref 3.7–4.7)
Alkaline Phosphatase: 102 IU/L (ref 48–121)
BUN/Creatinine Ratio: 18 (ref 12–28)
BUN: 13 mg/dL (ref 8–27)
Bilirubin Total: 0.5 mg/dL (ref 0.0–1.2)
CO2: 23 mmol/L (ref 20–29)
Calcium: 9.6 mg/dL (ref 8.7–10.3)
Chloride: 102 mmol/L (ref 96–106)
Creatinine, Ser: 0.72 mg/dL (ref 0.57–1.00)
GFR calc Af Amer: 95 mL/min/{1.73_m2} (ref >59)
GFR calc non Af Amer: 82 mL/min/{1.73_m2} (ref >59)
Globulin, Total: 2.7 g/dL (ref 1.5–4.5)
Glucose: 115 mg/dL — ABNORMAL HIGH (ref 65–99)
Potassium: 4.3 mmol/L (ref 3.5–5.2)
Sodium: 142 mmol/L (ref 134–144)
Total Protein: 6.8 g/dL (ref 6.0–8.5)

## 2020-01-07 LAB — VITAMIN D 25 HYDROXY (VIT D DEFICIENCY, FRACTURES): Vit D, 25-Hydroxy: 41.3 ng/mL (ref 30.0–100.0)

## 2020-01-07 LAB — HEMOGLOBIN A1C
Est. average glucose Bld gHb Est-mCnc: 131 mg/dL
Hgb A1c MFr Bld: 6.2 % — ABNORMAL HIGH (ref 4.8–5.6)

## 2020-01-07 NOTE — Telephone Encounter (Signed)
-----   Message from Glendale Chard, MD sent at 01/07/2020  9:39 AM EDT ----- Here are your lab results:  Your vitamin D is fair at 41. Ideally, this should be between 60-80. What dose are currently taking? If none, start 2000 IU vitamin D3 capsule once daily.   Your liver and kidney function are stable. Your hba1c is 6.2, this is in prediabetes range. Goal is 5.6 or less. As you increase your daily activity, this will improve.   Your LDL, bad cholesterol is 139. Ideally, this should be less than 100. This has increased from last year. Again, as you increase your daily activity, this will improve. I also suggest taking Metamucil once daily. If no improvement, we will need to start medication.   Please let me know if you have any questions or concerns. Stay safe!   Sincerely,    Robyn N. Baird [facility], MD

## 2020-01-08 ENCOUNTER — Encounter: Payer: Self-pay | Admitting: Internal Medicine

## 2020-01-10 DIAGNOSIS — M25551 Pain in right hip: Secondary | ICD-10-CM | POA: Diagnosis not present

## 2020-01-28 ENCOUNTER — Other Ambulatory Visit: Payer: Self-pay

## 2020-01-28 DIAGNOSIS — J449 Chronic obstructive pulmonary disease, unspecified: Secondary | ICD-10-CM

## 2020-02-02 DIAGNOSIS — M25551 Pain in right hip: Secondary | ICD-10-CM | POA: Diagnosis not present

## 2020-03-08 DIAGNOSIS — Z23 Encounter for immunization: Secondary | ICD-10-CM | POA: Diagnosis not present

## 2020-03-13 ENCOUNTER — Other Ambulatory Visit: Payer: Medicare Other

## 2020-03-13 ENCOUNTER — Other Ambulatory Visit: Payer: Self-pay

## 2020-03-13 DIAGNOSIS — Z20822 Contact with and (suspected) exposure to covid-19: Secondary | ICD-10-CM

## 2020-03-14 ENCOUNTER — Encounter: Payer: Self-pay | Admitting: Internal Medicine

## 2020-03-14 LAB — NOVEL CORONAVIRUS, NAA: SARS-CoV-2, NAA: NOT DETECTED

## 2020-03-14 LAB — SARS-COV-2, NAA 2 DAY TAT

## 2020-04-04 ENCOUNTER — Other Ambulatory Visit: Payer: Self-pay

## 2020-04-04 MED ORDER — PREVNAR 13 IM SUSP: 0.5000 mL | INTRAMUSCULAR | 0 refills | Status: AC

## 2020-04-18 ENCOUNTER — Other Ambulatory Visit: Payer: Self-pay

## 2020-04-18 ENCOUNTER — Ambulatory Visit: Payer: Medicare Other | Attending: Internal Medicine

## 2020-04-18 DIAGNOSIS — Z23 Encounter for immunization: Secondary | ICD-10-CM

## 2020-04-18 NOTE — Progress Notes (Signed)
   Covid-19 Vaccination Clinic  Name:  Bristal Steffy    MRN: [id number] DOB: 1946/04/16  04/18/2020  [name] was observed post Covid-19 immunization for 15 minutes without incident. She was provided with Vaccine Information Sheet and instruction to access the V-Safe system.   Ms. Chatham was instructed to call 911 with any severe reactions post vaccine: Marland Kitchen Difficulty breathing  . Swelling of face and throat  . A fast heartbeat  . A bad rash all over body  . Dizziness and weakness

## 2020-05-09 ENCOUNTER — Telehealth: Payer: Self-pay | Admitting: Pulmonary Disease

## 2020-05-09 NOTE — Telephone Encounter (Signed)
Spoke with pt. She had a question about the CXR she had done in March 2021. Per documentation, a detailed message was left with the pt about her results. Pt states that she never got this message. Results were reviewed with the pt. Nothing further was needed.

## 2020-06-12 DIAGNOSIS — L720 Epidermal cyst: Secondary | ICD-10-CM | POA: Diagnosis not present

## 2020-06-13 ENCOUNTER — Ambulatory Visit (INDEPENDENT_AMBULATORY_CARE_PROVIDER_SITE_OTHER): Payer: Medicare Other | Admitting: Pulmonary Disease

## 2020-06-13 ENCOUNTER — Other Ambulatory Visit: Payer: Self-pay

## 2020-06-13 DIAGNOSIS — J449 Chronic obstructive pulmonary disease, unspecified: Secondary | ICD-10-CM

## 2020-06-13 LAB — PULMONARY FUNCTION TEST
DL/VA % pred: 102 %
DL/VA: 4.28 ml/min/mmHg/L
DLCO cor % pred: 88 %
DLCO cor: 15.83 ml/min/mmHg
DLCO unc % pred: 88 %
DLCO unc: 15.83 ml/min/mmHg
FEF 25-75 Post: 0.86 L/sec
FEF 25-75 Pre: 0.76 L/sec
FEF2575-%Change-Post: 13 %
FEF2575-%Pred-Post: 55 %
FEF2575-%Pred-Pre: 49 %
FEV1-%Change-Post: 4 %
FEV1-%Pred-Post: 67 %
FEV1-%Pred-Pre: 64 %
FEV1-Post: 1.3 L
FEV1-Pre: 1.25 L
FEV1FVC-%Change-Post: 2 %
FEV1FVC-%Pred-Pre: 95 %
FEV6-%Change-Post: 1 %
FEV6-%Pred-Post: 72 %
FEV6-%Pred-Pre: 71 %
FEV6-Post: 1.77 L
FEV6-Pre: 1.74 L
FEV6FVC-%Pred-Post: 105 %
FEV6FVC-%Pred-Pre: 105 %
FVC-%Change-Post: 1 %
FVC-%Pred-Post: 68 %
FVC-%Pred-Pre: 67 %
FVC-Post: 1.77 L
FVC-Pre: 1.74 L
Post FEV1/FVC ratio: 73 %
Post FEV6/FVC ratio: 100 %
Pre FEV1/FVC ratio: 72 %
Pre FEV6/FVC Ratio: 100 %
RV % pred: 101 %
RV: 2.22 L
TLC % pred: 91 %
TLC: 4.35 L

## 2020-06-13 NOTE — Progress Notes (Signed)
Full PFT performed today. °

## 2020-06-18 ENCOUNTER — Encounter: Payer: Self-pay | Admitting: Internal Medicine

## 2020-06-19 ENCOUNTER — Ambulatory Visit (INDEPENDENT_AMBULATORY_CARE_PROVIDER_SITE_OTHER): Payer: Medicare Other | Admitting: Internal Medicine

## 2020-06-19 ENCOUNTER — Encounter: Payer: Self-pay | Admitting: Internal Medicine

## 2020-06-19 ENCOUNTER — Other Ambulatory Visit: Payer: Self-pay

## 2020-06-19 VITALS — BP 118/62 | HR 88 | Temp 97.9°F | Ht 60.8 in | Wt 144.8 lb

## 2020-06-19 DIAGNOSIS — E559 Vitamin D deficiency, unspecified: Secondary | ICD-10-CM | POA: Diagnosis not present

## 2020-06-19 DIAGNOSIS — Z0000 Encounter for general adult medical examination without abnormal findings: Secondary | ICD-10-CM | POA: Diagnosis not present

## 2020-06-19 DIAGNOSIS — E663 Overweight: Secondary | ICD-10-CM

## 2020-06-19 DIAGNOSIS — J449 Chronic obstructive pulmonary disease, unspecified: Secondary | ICD-10-CM

## 2020-06-19 DIAGNOSIS — H9313 Tinnitus, bilateral: Secondary | ICD-10-CM

## 2020-06-19 DIAGNOSIS — Z6827 Body mass index (BMI) 27.0-27.9, adult: Secondary | ICD-10-CM | POA: Diagnosis not present

## 2020-06-19 DIAGNOSIS — R7309 Other abnormal glucose: Secondary | ICD-10-CM | POA: Diagnosis not present

## 2020-06-19 DIAGNOSIS — E7800 Pure hypercholesterolemia, unspecified: Secondary | ICD-10-CM | POA: Diagnosis not present

## 2020-06-19 DIAGNOSIS — R0982 Postnasal drip: Secondary | ICD-10-CM | POA: Diagnosis not present

## 2020-06-19 DIAGNOSIS — Z1152 Encounter for screening for COVID-19: Secondary | ICD-10-CM

## 2020-06-19 NOTE — Patient Instructions (Signed)
Health Maintenance, Female Adopting a healthy lifestyle and getting preventive care are important in promoting health and wellness. Ask your health care provider about:  The right schedule for you to have regular tests and exams.  Things you can do on your own to prevent diseases and keep yourself healthy. What should I know about diet, weight, and exercise? Eat a healthy diet   Eat a diet that includes plenty of vegetables, fruits, low-fat dairy products, and lean protein.  Do not eat a lot of foods that are high in solid fats, added sugars, or sodium. Maintain a healthy weight Body mass index (BMI) is used to identify weight problems. It estimates body fat based on height and weight. Your health care provider can help determine your BMI and help you achieve or maintain a healthy weight. Get regular exercise Get regular exercise. This is one of the most important things you can do for your health. Most adults should:  Exercise for at least 150 minutes each week. The exercise should increase your heart rate and make you sweat (moderate-intensity exercise).  Do strengthening exercises at least twice a week. This is in addition to the moderate-intensity exercise.  Spend less time sitting. Even light physical activity can be beneficial. Watch cholesterol and blood lipids Have your blood tested for lipids and cholesterol at 76 years of age, then have this test every 5 years. Have your cholesterol levels checked more often if:  Your lipid or cholesterol levels are high.  You are older than 76 years of age.  You are at high risk for heart disease. What should I know about cancer screening? Depending on your health history and family history, you may need to have cancer screening at various ages. This may include screening for:  Breast cancer.  Cervical cancer.  Colorectal cancer.  Skin cancer.  Lung cancer. What should I know about heart disease, diabetes, and high blood  pressure? Blood pressure and heart disease  High blood pressure causes heart disease and increases the risk of stroke. This is more likely to develop in people who have high blood pressure readings, are of African descent, or are overweight.  Have your blood pressure checked: ? Every 3-5 years if you are 18-39 years of age. ? Every year if you are 40 years old or older. Diabetes Have regular diabetes screenings. This checks your fasting blood sugar level. Have the screening done:  Once every three years after age 40 if you are at a normal weight and have a low risk for diabetes.  More often and at a younger age if you are overweight or have a high risk for diabetes. What should I know about preventing infection? Hepatitis B If you have a higher risk for hepatitis B, you should be screened for this virus. Talk with your health care provider to find out if you are at risk for hepatitis B infection. Hepatitis C Testing is recommended for:  Everyone born from 1945 through 1965.  Anyone with known risk factors for hepatitis C. Sexually transmitted infections (STIs)  Get screened for STIs, including gonorrhea and chlamydia, if: ? You are sexually active and are younger than 76 years of age. ? You are older than 76 years of age and your health care provider tells you that you are at risk for this type of infection. ? Your sexual activity has changed since you were last screened, and you are at increased risk for chlamydia or gonorrhea. Ask your health care provider if   you are at risk.  Ask your health care provider about whether you are at high risk for HIV. Your health care provider may recommend a prescription medicine to help prevent HIV infection. If you choose to take medicine to prevent HIV, you should first get tested for HIV. You should then be tested every 3 months for as long as you are taking the medicine. Pregnancy  If you are about to stop having your period (premenopausal) and  you may become pregnant, seek counseling before you get pregnant.  Take 400 to 800 micrograms (mcg) of folic acid every day if you become pregnant.  Ask for birth control (contraception) if you want to prevent pregnancy. Osteoporosis and menopause Osteoporosis is a disease in which the bones lose minerals and strength with aging. This can result in bone fractures. If you are 65 years old or older, or if you are at risk for osteoporosis and fractures, ask your health care provider if you should:  Be screened for bone loss.  Take a calcium or vitamin D supplement to lower your risk of fractures.  Be given hormone replacement therapy (HRT) to treat symptoms of menopause. Follow these instructions at home: Lifestyle  Do not use any products that contain nicotine or tobacco, such as cigarettes, e-cigarettes, and chewing tobacco. If you need help quitting, ask your health care provider.  Do not use street drugs.  Do not share needles.  Ask your health care provider for help if you need support or information about quitting drugs. Alcohol use  Do not drink alcohol if: ? Your health care provider tells you not to drink. ? You are pregnant, may be pregnant, or are planning to become pregnant.  If you drink alcohol: ? Limit how much you use to 0-1 drink a day. ? Limit intake if you are breastfeeding.  Be aware of how much alcohol is in your drink. In the [place] one drink equals one 12 oz bottle of beer (355 mL), one 5 oz glass of wine (148 mL), or one 1 oz glass of hard liquor (44 mL). General instructions  Schedule regular health, dental, and eye exams.  Stay current with your vaccines.  Tell your health care provider if: ? You often feel depressed. ? You have ever been abused or do not feel safe at home. Summary  Adopting a healthy lifestyle and getting preventive care are important in promoting health and wellness.  Follow your health care provider's instructions about healthy  diet, exercising, and getting tested or screened for diseases.  Follow your health care provider's instructions on monitoring your cholesterol and blood pressure. This information is not intended to replace advice given to you by your health care provider. Make sure you discuss any questions you have with your health care provider. Document Revised: 05/27/2018 Document Reviewed: 05/27/2018 Elsevier Patient Education  2020 [employer]  

## 2020-06-19 NOTE — Progress Notes (Signed)
I,Tianna Badgett,acting as a Education administrator for Maximino Greenland, MD.,have documented all relevant documentation on the behalf of Maximino Greenland, MD,as directed by  Maximino Greenland, MD while in the presence of Maximino Greenland, MD.  This visit occurred during the SARS-CoV-2 public health emergency.  Safety protocols were in place, including screening questions prior to the visit, additional usage of staff PPE, and extensive cleaning of exam room while observing appropriate contact time as indicated for disinfecting solutions.  Subjective:     Patient ID: Tracey Castro , female    DOB: Jan 02, 1946 , 75 y.o.   MRN: [id number]   Chief Complaint  Patient presents with  . Annual Exam    HPI  She presents for physical exam. She reports compliance with medications. She has no concerns at this time. Patient was advised that physical is not covered with traditional Medicare. Despite this, she wants to move forward with full exam and is okay with associated costs.  She is happy to state she has made some lifestyle changes - she is exercising more, cutting back on her wine intake as well as refined carbs.   Hyperlipidemia This is a chronic problem. The current episode started more than 1 year ago. The problem is uncontrolled. Current antihyperlipidemic treatment includes diet change. Risk factors for coronary artery disease include dyslipidemia and post-menopausal.     Past Medical History:  Diagnosis Date  . Hyperlipidemia   . Mycobacterium avium complex ([place])      Family History  Problem Relation Age of Onset  . Cancer - Ovarian Mother   . Cancer - Colon Father   . Parkinson's disease Father      Current Outpatient Medications:  .  Cholecalciferol (VITAMIN D) 2000 units tablet, Take 4,000-6,000 Units by mouth daily., Disp: , Rfl:  .  diphenhydrAMINE HCl, Sleep, 25 MG CAPS, Take by mouth. prn, Disp: , Rfl:  .  estradiol (ESTRACE) 0.1 MG/GM vaginal cream, USE ONCE A NIGHT FOR 2 WEEKS, THEN  SPACE OUT TO TWICE A WEEK, Disp: , Rfl:  .  fluticasone (FLONASE) 50 MCG/ACT nasal spray, Place 1 spray into the nose daily as needed for allergies. , Disp: , Rfl:  .  fluticasone furoate-vilanterol (BREO ELLIPTA) 100-25 MCG/INH AEPB, Inhale 1 puff into the lungs daily., Disp: 180 each, Rfl: 3 .  ibuprofen (ADVIL,MOTRIN) 200 MG tablet, Take 400 mg by mouth 2 (two) times daily as needed for headache or moderate pain., Disp: , Rfl:  .  Magnesium 500 MG CAPS, Take by mouth., Disp: , Rfl:  .  Multiple Vitamin (MULTIVITAMIN) capsule, Take 1 capsule by mouth daily., Disp: , Rfl:    Allergies  Allergen Reactions  . Sulfamethoxazole Rash    The patient states she uses post menopausal status for birth control. Last LMP was No LMP recorded. Patient has had a hysterectomy.. Negative for Dysmenorrhea. Negative for: breast discharge, breast lump(s), breast pain and breast self exam. Associated symptoms include abnormal vaginal bleeding. Pertinent negatives include abnormal bleeding (hematology), anxiety, decreased libido, depression, difficulty falling sleep, dyspareunia, history of infertility, nocturia, sexual dysfunction, sleep disturbances, urinary incontinence, urinary urgency, vaginal discharge and vaginal itching. Diet regular.    . The patient's tobacco use is:  Social History   Tobacco Use  Smoking Status Never Smoker  Smokeless Tobacco Never Used  . She has been exposed to passive smoke. The patient's alcohol use is:  Social History   Substance and Sexual Activity  Alcohol Use Yes  . Alcohol/week:  4.0 standard drinks  . Types: 4 Glasses of wine per week   Review of Systems  Constitutional: Negative.   HENT: Positive for tinnitus.        Admits tinnitus is chronic. Her previous ENT has retired. Would like to establish with new provider.   Eyes: Negative.   Respiratory: Negative.   Cardiovascular: Negative.   Gastrointestinal: Negative.   Endocrine: Negative.   Genitourinary: Negative.    Musculoskeletal: Negative.   Skin: Negative.   Allergic/Immunologic: Negative.   Neurological: Negative.   Hematological: Negative.   Psychiatric/Behavioral: Negative.      Today's Vitals   06/19/20 1409  BP: 118/62  Pulse: 88  Temp: 97.9 F (36.6 C)  TempSrc: Oral  Weight: 144 lb 12.8 oz (65.7 kg)  Height: 5' 0.8" (1.544 m)   Body mass index is 27.54 kg/m.  Wt Readings from Last 3 Encounters:  06/21/20 144 lb 12.8 oz (65.7 kg)  06/19/20 144 lb 12.8 oz (65.7 kg)  01/05/20 148 lb (67.1 kg)     Objective:  Physical Exam Vitals and nursing note reviewed.  Constitutional:      Appearance: Normal appearance.  HENT:     Head: Normocephalic and atraumatic.     Right Ear: Tympanic membrane, ear canal and external ear normal.     Left Ear: Tympanic membrane, ear canal and external ear normal.     Nose:     Comments: Deferred, masked    Mouth/Throat:     Comments: Deferred, masked Eyes:     Extraocular Movements: Extraocular movements intact.     Conjunctiva/sclera: Conjunctivae normal.     Pupils: Pupils are equal, round, and reactive to light.  Cardiovascular:     Rate and Rhythm: Normal rate and regular rhythm.     Pulses: Normal pulses.     Heart sounds: Normal heart sounds.  Pulmonary:     Effort: Pulmonary effort is normal.     Breath sounds: Normal breath sounds.  Chest:  Breasts:     Tanner Score is 5.     Right: Normal.     Left: Normal.    Abdominal:     General: Abdomen is flat. Bowel sounds are normal.     Palpations: Abdomen is soft.  Genitourinary:    Comments: deferred Musculoskeletal:        General: Normal range of motion.     Cervical back: Normal range of motion and neck supple.  Skin:    General: Skin is warm and dry.  Neurological:     General: No focal deficit present.     Mental Status: She is alert and oriented to person, place, and time.  Psychiatric:        Mood and Affect: Mood normal.        Behavior: Behavior normal.          Assessment And Plan:     1. Encounter for annual physical exam Comments: A full exam was performed. Importance of monthly self breast exams was discussed with patient. Again, pt was made aware that her insurance does not cover physical exams. Despite this, she chose to move forward with CPE.  PATIENT IS ADVISED TO GET 30-45 MINUTES REGULAR EXERCISE NO LESS THAN FOUR TO FIVE DAYS PER WEEK - BOTH WEIGHTBEARING EXERCISES AND AEROBIC ARE RECOMMENDED.  PATIENT IS ADVISED TO FOLLOW A HEALTHY DIET WITH AT LEAST SIX FRUITS/VEGGIES PER DAY, DECREASE INTAKE OF RED MEAT, AND TO INCREASE FISH INTAKE TO TWO DAYS PER WEEK.  MEATS/FISH SHOULD NOT BE FRIED, BAKED OR BROILED IS PREFERABLE.  I SUGGEST WEARING SPF 50 SUNSCREEN ON EXPOSED PARTS AND ESPECIALLY WHEN IN THE DIRECT SUNLIGHT FOR AN EXTENDED PERIOD OF TIME.  PLEASE AVOID FAST FOOD RESTAURANTS AND INCREASE YOUR WATER INTAKE.  2. Pure hypercholesterolemia Comments: Chronic, wants to return tomorrow for labwork. She was congratulated on her lifestyle changes thus far. Encouraged to limit fried food intake, increase fish intake, and exercise regularly.  - CBC; Future - Lipid panel; Future - CMP14+EGFR; Future - CMP14+EGFR - Lipid panel - CBC  3. Tinnitus of both ears Comments: Chronic. She agrees to ENT referral.  - Ambulatory referral to ENT  4. Postnasal drip Comments: She recently attended play at [place]. Would like to have COVID testing today.  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 She is also followed by Pulmonary.   6. Other abnormal glucose Comments: Her hba1c has been elevated in the past. She wants to return tomorrow for labwork, I will add this to her lab panel. Advised to avoid sugary drinks, incl. diet. - Hemoglobin A1c; Future - CMP14+EGFR; Future - CMP14+EGFR - Hemoglobin A1c  7. Vitamin D deficiency disease Comments: I will check vitamin D level and supplement as  needed.  - Vitamin D (25 hydroxy); Future - Vitamin D (25 hydroxy)  8. Overweight with body mass index (BMI) of 27 to 27.9 in adult Her weight is acceptable for her demographic. Advised to aim for at least 150 minutes of exercise per week.  Patient was given opportunity to ask questions. Patient verbalized understanding of the plan and was able to repeat key elements of the plan. All questions were answered to their satisfaction.  Maximino Greenland, MD   I, Maximino Greenland, MD, have reviewed all documentation for this visit. The documentation on 06/24/20 for the exam, diagnosis, procedures, and orders are all accurate and complete.  THE PATIENT IS ENCOURAGED TO PRACTICE SOCIAL DISTANCING DUE TO THE COVID-19 PANDEMIC.

## 2020-06-20 DIAGNOSIS — R7309 Other abnormal glucose: Secondary | ICD-10-CM | POA: Diagnosis not present

## 2020-06-20 DIAGNOSIS — E559 Vitamin D deficiency, unspecified: Secondary | ICD-10-CM | POA: Diagnosis not present

## 2020-06-20 DIAGNOSIS — E7800 Pure hypercholesterolemia, unspecified: Secondary | ICD-10-CM | POA: Diagnosis not present

## 2020-06-21 ENCOUNTER — Ambulatory Visit (INDEPENDENT_AMBULATORY_CARE_PROVIDER_SITE_OTHER): Payer: Medicare Other

## 2020-06-21 VITALS — Ht 62.0 in | Wt 144.8 lb

## 2020-06-21 DIAGNOSIS — Z0000 Encounter for general adult medical examination without abnormal findings: Secondary | ICD-10-CM

## 2020-06-21 DIAGNOSIS — Z23 Encounter for immunization: Secondary | ICD-10-CM

## 2020-06-21 LAB — HEMOGLOBIN A1C
Est. average glucose Bld gHb Est-mCnc: 128 mg/dL
Hgb A1c MFr Bld: 6.1 % — ABNORMAL HIGH (ref 4.8–5.6)

## 2020-06-21 LAB — CMP14+EGFR
ALT: 14 IU/L (ref 0–32)
AST: 16 IU/L (ref 0–40)
Albumin/Globulin Ratio: 1.8 (ref 1.2–2.2)
Albumin: 4.4 g/dL (ref 3.7–4.7)
Alkaline Phosphatase: 113 IU/L (ref 44–121)
BUN/Creatinine Ratio: 14 (ref 12–28)
BUN: 10 mg/dL (ref 8–27)
Bilirubin Total: 0.5 mg/dL (ref 0.0–1.2)
CO2: 26 mmol/L (ref 20–29)
Calcium: 9.6 mg/dL (ref 8.7–10.3)
Chloride: 103 mmol/L (ref 96–106)
Creatinine, Ser: 0.7 mg/dL (ref 0.57–1.00)
GFR calc Af Amer: 98 mL/min/{1.73_m2} (ref >59)
GFR calc non Af Amer: 85 mL/min/{1.73_m2} (ref >59)
Globulin, Total: 2.5 g/dL (ref 1.5–4.5)
Glucose: 110 mg/dL — ABNORMAL HIGH (ref 65–99)
Potassium: 4.3 mmol/L (ref 3.5–5.2)
Sodium: 141 mmol/L (ref 134–144)
Total Protein: 6.9 g/dL (ref 6.0–8.5)

## 2020-06-21 LAB — SARS-COV-2, NAA 2 DAY TAT

## 2020-06-21 LAB — CBC
Hematocrit: 39.4 % (ref 34.0–46.6)
Hemoglobin: 13.2 g/dL (ref 11.1–15.9)
MCH: 28.4 pg (ref 26.6–33.0)
MCHC: 33.5 g/dL (ref 31.5–35.7)
MCV: 85 fL (ref 79–97)
Platelets: 443 10*3/uL (ref 150–450)
RBC: 4.65 x10E6/uL (ref 3.77–5.28)
RDW: 13.1 % (ref 11.7–15.4)
WBC: 7.9 10*3/uL (ref 3.4–10.8)

## 2020-06-21 LAB — VITAMIN D 25 HYDROXY (VIT D DEFICIENCY, FRACTURES): Vit D, 25-Hydroxy: 49.6 ng/mL (ref 30.0–100.0)

## 2020-06-21 LAB — LIPID PANEL
Chol/HDL Ratio: 3.3 ratio (ref 0.0–4.4)
Cholesterol, Total: 209 mg/dL — ABNORMAL HIGH (ref 100–199)
HDL: 64 mg/dL (ref >39)
LDL Chol Calc (NIH): 125 mg/dL — ABNORMAL HIGH (ref 0–99)
Triglycerides: 114 mg/dL (ref 0–149)
VLDL Cholesterol Cal: 20 mg/dL (ref 5–40)

## 2020-06-21 LAB — NOVEL CORONAVIRUS, NAA: SARS-CoV-2, NAA: NOT DETECTED

## 2020-06-21 MED ORDER — PREVNAR 13 IM SUSP: 0.5000 mL | INTRAMUSCULAR | 0 refills | Status: AC

## 2020-06-21 NOTE — Progress Notes (Signed)
I connected with Tracey Castro today by telephone and verified that I am speaking with the correct person using two identifiers. Location patient: home Location provider: work Persons participating in the virtual visit: Domique Clapper- Unk Lightning, Glenna Durand LPN.   I discussed the limitations, risks, security and privacy concerns of performing an evaluation and management service by telephone and the availability of in person appointments. I also discussed with the patient that there may be a patient responsible charge related to this service. The patient expressed understanding and verbally consented to this telephonic visit.    Interactive audio and video telecommunications were attempted between this provider and patient, however failed, due to patient having technical difficulties OR patient did not have access to video capability.  We continued and completed visit with audio only.     Vital signs may be patient reported or missing  Subjective:   Tracey Castro is a 75 y.o. female who presents for Medicare Annual (Subsequent) preventive examination.  Review of Systems     Cardiac Risk Factors include: advanced age (>57mn, >>26women)     Objective:    Today's Vitals   06/21/20 1001  Weight: 144 lb 12.8 oz (65.7 kg)  Height: _0  (1.575 m)   Body mass index is 26.48 kg/m.  Advanced Directives 06/21/2020 06/09/2019 06/03/2018 03/27/2018  Does Patient Have a Medical Advance Directive? Yes Yes Yes Yes  Type of AParamed[place]of [place] will [place] will [place] will Living will  Does patient want to make changes to medical advance directive? - - No - Patient declined -  Copy of [place] Chart? No - copy requested No - copy requested No - copy requested -    Current Medications (verified) Outpatient Encounter Medications as of 06/21/2020  Medication Sig  .  Cholecalciferol (VITAMIN D) 2000 units tablet Take 4,000-6,000 Units by mouth daily.  . diphenhydrAMINE HCl, Sleep, 25 MG CAPS Take by mouth. prn  . estradiol (ESTRACE) 0.1 MG/GM vaginal cream USE ONCE A NIGHT FOR 2 WEEKS, THEN SPACE OUT TO TWICE A WEEK  . fluticasone (FLONASE) 50 MCG/ACT nasal spray Place 1 spray into the nose daily as needed for allergies.   . fluticasone furoate-vilanterol (BREO ELLIPTA) 100-25 MCG/INH AEPB Inhale 1 puff into the lungs daily.  .Marland Kitchenibuprofen (ADVIL,MOTRIN) 200 MG tablet Take 400 mg by mouth 2 (two) times daily as needed for headache or moderate pain.  . Magnesium 500 MG CAPS Take by mouth.  . Multiple Vitamin (MULTIVITAMIN) capsule Take 1 capsule by mouth daily.   No facility-administered encounter medications on file as of 06/21/2020.    Allergies (verified) Sulfamethoxazole   History: Past Medical History:  Diagnosis Date  . Hyperlipidemia   . 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 BREAST CYST ASPIRATION    . VIDEO BRONCHOSCOPY Bilateral 03/27/2018   Procedure: VIDEO BRONCHOSCOPY WITHOUT FLUORO;  Surgeon: WTanda Rockers MD;  [facility]  Service: Cardiopulmonary;  Laterality: Bilateral;   Family History  Problem Relation Age of Onset  . Cancer - Ovarian Mother   . Cancer - Colon Father   . Parkinson's disease Father    Social History   Socioeconomic History  . Marital status: Married    Spouse name: Not on file  . Number of children: Not on file  . Years of education: Not on file  . Highest education level: Not  on file  Occupational History  . Occupation: retired  Tobacco Use  . Smoking status: Never Smoker  . Smokeless tobacco: Never Used  Vaping Use  . Vaping Use: Never used  Substance and Sexual Activity  . Alcohol use: Yes    Alcohol/week: 4.0 standard drinks    Types: 4 Glasses of wine per week  . Drug use: Never  . Sexual activity: Yes  Other Topics Concern   . Not on file  Social History Narrative  . Not on file   Social Determinants of Health   Financial Resource Strain: Low Risk   . Difficulty of Paying Living Expenses: Not hard at all  Food Insecurity: No Food Insecurity  . Worried About Charity fundraiser in the Last Year: Never true  . Ran Out of Food in the Last Year: Never true  Transportation Needs: No Transportation Needs  . Lack of Transportation (Medical): No  . Lack of Transportation (Non-Medical): No  Physical Activity: Insufficiently Active  . Days of Exercise per Week: 3 days  . Minutes of Exercise per Session: 30 min  Stress: No Stress Concern Present  . Feeling of Stress : Not at all  Social Connections: Not on file    Tobacco Counseling Counseling given: Not Answered   Clinical Intake:  Pre-visit preparation completed: Yes  Pain : No/denies pain     Nutritional Status: BMI 25 -29 Overweight Nutritional Risks: None Diabetes: No  How often do you need to have someone help you when you read instructions, pamphlets, or other written materials from your doctor or pharmacy?: 1 - Never What is the last grade level you completed in school?: master's degree  Diabetic? no  Interpreter Needed?: No  Information entered by :: NAllen LPN   Activities of Daily Living In your present state of health, do you have any difficulty performing the following activities: 06/21/2020 06/19/2020  Hearing? N N  Vision? N N  Difficulty concentrating or making decisions? N N  Walking or climbing stairs? N N  Dressing or bathing? N N  Doing errands, shopping? N N  Preparing Food and eating ? N -  Using the Toilet? N -  In the past six months, have you accidently leaked urine? Y -  Comment with laughter -  Do you have problems with loss of bowel control? N -  Managing your Medications? N -  Managing your Finances? N -  Housekeeping or managing your Housekeeping? N -  Some recent data might be hidden    Patient Care  Team: Glendale Chard, MD as PCP - General (Internal Medicine)  Indicate any recent Medical Services you may have received from other than [facility] providers in the past year (date may be approximate).     Assessment:   This is a routine wellness examination for Tracey Castro.  Hearing/Vision screen No exam data present  Dietary issues and exercise activities discussed: Current Exercise Habits: Home exercise routine, Type of exercise: walking, Time (Minutes): 30, Frequency (Times/Week): 3, Weekly Exercise (Minutes/Week): 90  Goals    .  exercise regularly (pt-stated)    .  Patient Stated      06/21/2020, wants to exercise more    .  Weight (lb) < 200 lb (90.7 kg) (pt-stated)      Would like to lose 5 pounds    .  Weight (lb) < 200 lb (90.7 kg)      06/09/2019, wants to lose 5-10 pounds      Depression Screen  PHQ 2/9 Scores 06/21/2020 06/19/2020 06/09/2019 11/10/2018 06/03/2018 06/03/2018 02/19/2018  PHQ - 2 Score 0 0 0 0 0 0 0  PHQ- 9 Score - - 3 - - - -    Fall Risk Fall Risk  06/21/2020 06/19/2020 06/09/2019 11/10/2018 06/03/2018  Falls in the past year? 0 0 0 0 0  Comment - - - - -  Number falls in past yr: - 0 - - -  Injury with Fall? - 0 - - -  Risk for fall due to : No Fall Risks - - - -  Follow up Falls evaluation completed;Education provided;Falls prevention discussed - Falls evaluation completed;Education provided;Falls prevention discussed - -    FALL RISK PREVENTION PERTAINING TO THE HOME:  Any stairs in or around the home? Yes  If so, are there any without handrails? No  Home free of loose throw rugs in walkways, pet beds, electrical cords, etc? Yes  Adequate lighting in your home to reduce risk of falls? Yes   ASSISTIVE DEVICES UTILIZED TO PREVENT FALLS:  Life alert? No  Use of a cane, walker or w/c? No  Grab bars in the bathroom? Yes  Shower chair or bench in shower? Yes  Elevated toilet seat or a handicapped toilet? Yes   TIMED UP AND GO:  Was the test performed? No .     Cognitive Function:     6CIT Screen 06/21/2020 06/09/2019  What Year? 0 points 0 points  What month? 0 points 0 points  What time? 0 points 0 points  Count back from 20 0 points 0 points  Months in reverse 0 points 0 points  Repeat phrase 2 points 0 points  Total Score 2 0    Immunizations Immunization History  Administered Date(s) Administered  . Fluad Quad(high Dose 65+) 03/18/2019  . Influenza, High Dose Seasonal PF 06/03/2018  . Influenza-Unspecified 04/17/2013  . PFIZER SARS-COV-2 Vaccination 07/08/2019, 07/29/2019, 04/18/2020  . Pneumococcal-Unspecified 03/28/2015    TDAP status: Up to date  Flu Vaccine status: Up to date  Pneumococcal vaccine status: Due, Education has been provided regarding the importance of this vaccine. Advised may receive this vaccine at local pharmacy or Health Dept. Aware to provide a copy of the vaccination record if obtained from local pharmacy or Health Dept. Verbalized acceptance and understanding.  Covid-19 vaccine status: Completed vaccines  Qualifies for Shingles Vaccine? Yes   Zostavax completed No   Shingrix Completed?: No.    Education has been provided regarding the importance of this vaccine. Patient has been advised to call insurance company to determine out of pocket expense if they have not yet received this vaccine. Advised may also receive vaccine at local pharmacy or Health Dept. Verbalized acceptance and understanding.  Screening Tests Health Maintenance  Topic Date Due  . PNA vac Low Risk Adult (2 of 2 - PCV13) 03/27/2016  . TETANUS/TDAP  02/27/2023  . COLONOSCOPY (Pts 45-26yr Insurance coverage will need to be confirmed)  09/30/2029  . INFLUENZA VACCINE  Completed  . DEXA SCAN  Completed  . COVID-19 Vaccine  Completed  . Hepatitis C Screening  Completed    Health Maintenance  Health Maintenance Due  Topic Date Due  . PNA vac Low Risk Adult (2 of 2 - PCV13) 03/27/2016    Colorectal cancer screening: No  longer required.   Mammogram status: Completed 09/03/2019. Repeat every year  Bone Density status: Completed 06/13/2017.   Lung Cancer Screening: (Low Dose CT Chest recommended if Age 76-80years, 359  pack-year currently smoking OR have quit w/in 15years.) does not qualify.  Lung Cancer Screening Referral: no  Additional Screening:  Hepatitis C Screening: does qualify; Completed 06/04/2018  Vision Screening: Recommended annual ophthalmology exams for early detection of glaucoma and other disorders of the eye. Is the patient up to date with their annual eye exam?  Yes  Who is the provider or what is the name of the office in which the patient attends annual eye exams? Dr. Phineas Douglas If pt is not established with a provider, would they like to be referred to a provider to establish care? No .   Dental Screening: Recommended annual dental exams for proper oral hygiene  Community Resource Referral / Chronic Care Management: CRR required this visit?  No   CCM required this visit?  No      Plan:     I have personally reviewed and noted the following in the patient's chart:   . Medical and social history . Use of alcohol, tobacco or illicit drugs  . Current medications and supplements . Functional ability and status . Nutritional status . Physical activity . Advanced directives . List of other physicians . Hospitalizations, surgeries, and ER visits in previous 12 months . Vitals . Screenings to include cognitive, depression, and falls . Referrals and appointments  In addition, I have reviewed and discussed with patient certain preventive protocols, quality metrics, and best practice recommendations. A written personalized care plan for preventive services as well as general preventive health recommendations were provided to patient.     Kellie Simmering, LPN   02/22/3381   Nurse Notes:

## 2020-06-21 NOTE — Patient Instructions (Signed)
[name] , Thank you for taking time to come for your Medicare Wellness Visit. I appreciate your ongoing commitment to your health goals. Please review the following plan we discussed and let me know if I can assist you in the future.   Screening recommendations/referrals: Colonoscopy: not required Mammogram: completed 09/03/2019, due 09/02/2020 Bone Density: completed 06/13/2017 Recommended yearly ophthalmology/optometry visit for glaucoma screening and checkup Recommended yearly dental visit for hygiene and checkup  Vaccinations: Influenza vaccine: completed 03/08/2020, due 01/15/2021 Pneumococcal vaccine: sent to pharmacy Tdap vaccine: completed 02/26/2013, due 02/27/2023 Shingles vaccine:  discussed   Covid-19: 04/18/2020, 07/29/2019, 07/08/2019  Advanced directives: Please bring a copy of your POA (Power of Attorney) and/or Living Will to your next appointment.   Conditions/risks identified: none  Next appointment: Follow up in one year for your annual wellness visit    Preventive Care 65 Years and Older, Female Preventive care refers to lifestyle choices and visits with your health care provider that can promote health and wellness. What does preventive care include?  A yearly physical exam. This is also called an annual well check.  Dental exams once or twice a year.  Routine eye exams. Ask your health care provider how often you should have your eyes checked.  Personal lifestyle choices, including:  Daily care of your teeth and gums.  Regular physical activity.  Eating a healthy diet.  Avoiding tobacco and drug use.  Limiting alcohol use.  Practicing safe sex.  Taking low-dose aspirin every day.  Taking vitamin and mineral supplements as recommended by your health care provider. What happens during an annual well check? The services and screenings done by your health care provider during your annual well check will depend on your age, overall health,  lifestyle risk factors, and family history of disease. Counseling  Your health care provider may ask you questions about your:  Alcohol use.  Tobacco use.  Drug use.  Emotional well-being.  Home and relationship well-being.  Sexual activity.  Eating habits.  History of falls.  Memory and ability to understand (cognition).  Work and work Astronomer.  Reproductive health. Screening  You may have the following tests or measurements:  Height, weight, and BMI.  Blood pressure.  Lipid and cholesterol levels. These may be checked every 5 years, or more frequently if you are over 34 years old.  Skin check.  Lung cancer screening. You may have this screening every year starting at age 58 if you have a 30-pack-year history of smoking and currently smoke or have quit within the past 15 years.  Fecal occult blood test (FOBT) of the stool. You may have this test every year starting at age 42.  Flexible sigmoidoscopy or colonoscopy. You may have a sigmoidoscopy every 5 years or a colonoscopy every 10 years starting at age 66.  Hepatitis C blood test.  Hepatitis B blood test.  Sexually transmitted disease (STD) testing.  Diabetes screening. This is done by checking your blood sugar (glucose) after you have not eaten for a while (fasting). You may have this done every 1-3 years.  Bone density scan. This is done to screen for osteoporosis. You may have this done starting at age 38.  Mammogram. This may be done every 1-2 years. Talk to your health care provider about how often you should have regular mammograms. Talk with your health care provider about your test results, treatment options, and if necessary, the need for more tests. Vaccines  Your health care provider may recommend certain vaccines,  such as:  Influenza vaccine. This is recommended every year.  Tetanus, diphtheria, and acellular pertussis (Tdap, Td) vaccine. You may need a Td booster every 10 years.  Zoster  vaccine. You may need this after age 29.  Pneumococcal 13-valent conjugate (PCV13) vaccine. One dose is recommended after age 77.  Pneumococcal polysaccharide (PPSV23) vaccine. One dose is recommended after age 20. Talk to your health care provider about which screenings and vaccines you need and how often you need them. This information is not intended to replace advice given to you by your health care provider. Make sure you discuss any questions you have with your health care provider. Document Released: 06/30/2015 Document Revised: 02/21/2016 Document Reviewed: 04/04/2015 Elsevier Interactive Patient Education  2017 [place] Prevention in the Home Falls can cause injuries. They can happen to people of all ages. There are many things you can do to make your home safe and to help prevent falls. What can I do on the outside of my home?  Regularly fix the edges of walkways and driveways and fix any cracks.  Remove anything that might make you trip as you walk through a door, such as a raised step or threshold.  Trim any bushes or trees on the path to your home.  Use bright outdoor lighting.  Clear any walking paths of anything that might make someone trip, such as rocks or tools.  Regularly check to see if handrails are loose or broken. Make sure that both sides of any steps have handrails.  Any raised decks and porches should have guardrails on the edges.  Have any leaves, snow, or ice cleared regularly.  Use sand or salt on walking paths during winter.  Clean up any spills in your garage right away. This includes oil or grease spills. What can I do in the bathroom?  Use night lights.  Install grab bars by the toilet and in the tub and shower. Do not use towel bars as grab bars.  Use non-skid mats or decals in the tub or shower.  If you need to sit down in the shower, use a plastic, non-slip stool.  Keep the floor dry. Clean up any water that spills on the  floor as soon as it happens.  Remove soap buildup in the tub or shower regularly.  Attach bath mats securely with double-sided non-slip rug tape.  Do not have throw rugs and other things on the floor that can make you trip. What can I do in the bedroom?  Use night lights.  Make sure that you have a light by your bed that is easy to reach.  Do not use any sheets or blankets that are too big for your bed. They should not hang down onto the floor.  Have a firm chair that has side arms. You can use this for support while you get dressed.  Do not have throw rugs and other things on the floor that can make you trip. What can I do in the kitchen?  Clean up any spills right away.  Avoid walking on wet floors.  Keep items that you use a lot in easy-to-reach places.  If you need to reach something above you, use a strong step stool that has a grab bar.  Keep electrical cords out of the way.  Do not use floor polish or wax that makes floors slippery. If you must use wax, use non-skid floor wax.  Do not have throw rugs and other things on  the floor that can make you trip. What can I do with my stairs?  Do not leave any items on the stairs.  Make sure that there are handrails on both sides of the stairs and use them. Fix handrails that are broken or loose. Make sure that handrails are as long as the stairways.  Check any carpeting to make sure that it is firmly attached to the stairs. Fix any carpet that is loose or worn.  Avoid having throw rugs at the top or bottom of the stairs. If you do have throw rugs, attach them to the floor with carpet tape.  Make sure that you have a light switch at the top of the stairs and the bottom of the stairs. If you do not have them, ask someone to add them for you. What else can I do to help prevent falls?  Wear shoes that:  Do not have high heels.  Have rubber bottoms.  Are comfortable and fit you well.  Are closed at the toe. Do not wear  sandals.  If you use a stepladder:  Make sure that it is fully opened. Do not climb a closed stepladder.  Make sure that both sides of the stepladder are locked into place.  Ask someone to hold it for you, if possible.  Clearly mark and make sure that you can see:  Any grab bars or handrails.  First and last steps.  Where the edge of each step is.  Use tools that help you move around (mobility aids) if they are needed. These include:  Canes.  Walkers.  Scooters.  Crutches.  Turn on the lights when you go into a dark area. Replace any light bulbs as soon as they burn out.  Set up your furniture so you have a clear path. Avoid moving your furniture around.  If any of your floors are uneven, fix them.  If there are any pets around you, be aware of where they are.  Review your medicines with your doctor. Some medicines can make you feel dizzy. This can increase your chance of falling. Ask your doctor what other things that you can do to help prevent falls. This information is not intended to replace advice given to you by your health care provider. Make sure you discuss any questions you have with your health care provider. Document Released: 03/30/2009 Document Revised: 11/09/2015 Document Reviewed: 07/08/2014 Elsevier Interactive Patient Education  2017 [employer].

## 2020-06-27 ENCOUNTER — Encounter: Payer: Self-pay | Admitting: Internal Medicine

## 2020-06-28 DIAGNOSIS — Z23 Encounter for immunization: Secondary | ICD-10-CM | POA: Diagnosis not present

## 2020-07-17 DIAGNOSIS — H9313 Tinnitus, bilateral: Secondary | ICD-10-CM | POA: Diagnosis not present

## 2020-07-17 DIAGNOSIS — H903 Sensorineural hearing loss, bilateral: Secondary | ICD-10-CM | POA: Diagnosis not present

## 2020-07-17 DIAGNOSIS — H838X3 Other specified diseases of inner ear, bilateral: Secondary | ICD-10-CM | POA: Diagnosis not present

## 2020-08-11 NOTE — Telephone Encounter (Signed)
Received the following message from patient:   "Good morning, Dr. Elsworth Soho, In January, I had a Pulmonary Test. It was a very difficult experience for me on several levels.  I have never received any information regarding the result (which I am sure were poor). The tester had me doing parts of the test over and over again.  For a while I thought it was just because everyone was busy with treating Omicron patients. It is now almost 2 months and I have not heard from your office.  I would appreciate some contact. I will not be available on Monday, as I have finally decided to get the nerve to get on a plane and see family (masked of course). You can reach me on my cell at [phone] today or on Tuesday and thereafter.  I hope you are well. Tiered, I am sure. Tracey Castro"  RA, can you please advise on her breathing test results? Thanks!

## 2020-08-11 NOTE — Telephone Encounter (Signed)
Sorry for the delay.  This must have somehow escaped my attention. PFTs showed improved FEV1 at 64% compared to previous results in 2020 in 2019.  Total lung capacity was within normal range.  No change in plan

## 2020-08-14 NOTE — Telephone Encounter (Signed)
Dr. Elsworth Soho, Patient wants to know:  What FEV was my % in 2019 and 2020? Finally, what is the normal range?  Thanks

## 2020-08-15 NOTE — Telephone Encounter (Signed)
Previously, FEV1 was 52 and 57% range. Normal is 80% and above

## 2020-08-25 DIAGNOSIS — M9902 Segmental and somatic dysfunction of thoracic region: Secondary | ICD-10-CM | POA: Diagnosis not present

## 2020-08-25 DIAGNOSIS — M9901 Segmental and somatic dysfunction of cervical region: Secondary | ICD-10-CM | POA: Diagnosis not present

## 2020-08-25 DIAGNOSIS — S29012A Strain of muscle and tendon of back wall of thorax, initial encounter: Secondary | ICD-10-CM | POA: Diagnosis not present

## 2020-08-25 DIAGNOSIS — S134XXA Sprain of ligaments of cervical spine, initial encounter: Secondary | ICD-10-CM | POA: Diagnosis not present

## 2020-08-28 ENCOUNTER — Emergency Department (HOSPITAL_COMMUNITY): Payer: Medicare Other | Admitting: Certified Registered"

## 2020-08-28 ENCOUNTER — Encounter (HOSPITAL_COMMUNITY): Payer: Self-pay

## 2020-08-28 ENCOUNTER — Emergency Department (HOSPITAL_COMMUNITY): Payer: Medicare Other

## 2020-08-28 ENCOUNTER — Other Ambulatory Visit: Payer: Self-pay

## 2020-08-28 ENCOUNTER — Encounter (HOSPITAL_COMMUNITY)
Admission: EM | Disposition: A | Discharge status: Home / Self Care | Payer: Self-pay | Source: Home / Self Care | Attending: Emergency Medicine

## 2020-08-28 ENCOUNTER — Ambulatory Visit (HOSPITAL_COMMUNITY)
Admission: EM | Admit: 2020-08-28 | Discharge: 2020-08-28 | Disposition: A | Discharge status: Home / Self Care | Payer: Medicare Other | Attending: Emergency Medicine | Admitting: Emergency Medicine

## 2020-08-28 DIAGNOSIS — S80812A Abrasion, left lower leg, initial encounter: Secondary | ICD-10-CM | POA: Diagnosis not present

## 2020-08-28 DIAGNOSIS — Y9239 Other specified sports and athletic area as the place of occurrence of the external cause: Secondary | ICD-10-CM | POA: Insufficient documentation

## 2020-08-28 DIAGNOSIS — Y93A1 Activity, exercise machines primarily for cardiorespiratory conditioning: Secondary | ICD-10-CM | POA: Insufficient documentation

## 2020-08-28 DIAGNOSIS — Z23 Encounter for immunization: Secondary | ICD-10-CM | POA: Diagnosis not present

## 2020-08-28 DIAGNOSIS — E785 Hyperlipidemia, unspecified: Secondary | ICD-10-CM | POA: Diagnosis not present

## 2020-08-28 DIAGNOSIS — S81012A Laceration without foreign body, left knee, initial encounter: Secondary | ICD-10-CM | POA: Diagnosis not present

## 2020-08-28 DIAGNOSIS — Z20822 Contact with and (suspected) exposure to covid-19: Secondary | ICD-10-CM | POA: Insufficient documentation

## 2020-08-28 DIAGNOSIS — Z7951 Long term (current) use of inhaled steroids: Secondary | ICD-10-CM | POA: Insufficient documentation

## 2020-08-28 DIAGNOSIS — J449 Chronic obstructive pulmonary disease, unspecified: Secondary | ICD-10-CM | POA: Diagnosis not present

## 2020-08-28 DIAGNOSIS — W19XXXA Unspecified fall, initial encounter: Secondary | ICD-10-CM | POA: Diagnosis not present

## 2020-08-28 DIAGNOSIS — R6889 Other general symptoms and signs: Secondary | ICD-10-CM | POA: Diagnosis not present

## 2020-08-28 DIAGNOSIS — M25521 Pain in right elbow: Secondary | ICD-10-CM | POA: Diagnosis not present

## 2020-08-28 DIAGNOSIS — Z79890 Hormone replacement therapy (postmenopausal): Secondary | ICD-10-CM | POA: Diagnosis not present

## 2020-08-28 DIAGNOSIS — Z743 Need for continuous supervision: Secondary | ICD-10-CM | POA: Diagnosis not present

## 2020-08-28 DIAGNOSIS — S80811A Abrasion, right lower leg, initial encounter: Secondary | ICD-10-CM

## 2020-08-28 DIAGNOSIS — J479 Bronchiectasis, uncomplicated: Secondary | ICD-10-CM | POA: Diagnosis not present

## 2020-08-28 HISTORY — PX: INCISION AND DRAINAGE: SHX5863

## 2020-08-28 LAB — BASIC METABOLIC PANEL
Anion gap: 10 (ref 5–15)
BUN: 17 mg/dL (ref 8–23)
CO2: 22 mmol/L (ref 22–32)
Calcium: 9.3 mg/dL (ref 8.9–10.3)
Chloride: 104 mmol/L (ref 98–111)
Creatinine, Ser: 0.75 mg/dL (ref 0.44–1.00)
GFR, Estimated: >60 mL/min (ref >60)
Glucose, Bld: 117 mg/dL — ABNORMAL HIGH (ref 70–99)
Potassium: 3.7 mmol/L (ref 3.5–5.1)
Sodium: 136 mmol/L (ref 135–145)

## 2020-08-28 LAB — CBC WITH DIFFERENTIAL/PLATELET
Abs Immature Granulocytes: 0.04 10*3/uL (ref 0.00–0.07)
Basophils Absolute: 0.1 10*3/uL (ref 0.0–0.1)
Basophils Relative: 1 %
Eosinophils Absolute: 0.1 10*3/uL (ref 0.0–0.5)
Eosinophils Relative: 1 %
HCT: 37.7 % (ref 36.0–46.0)
Hemoglobin: 12.1 g/dL (ref 12.0–15.0)
Immature Granulocytes: 1 %
Lymphocytes Relative: 26 %
Lymphs Abs: 2.2 10*3/uL (ref 0.7–4.0)
MCH: 28.1 pg (ref 26.0–34.0)
MCHC: 32.1 g/dL (ref 30.0–36.0)
MCV: 87.5 fL (ref 80.0–100.0)
Monocytes Absolute: 0.8 10*3/uL (ref 0.1–1.0)
Monocytes Relative: 9 %
Neutro Abs: 5.4 10*3/uL (ref 1.7–7.7)
Neutrophils Relative %: 62 %
Platelets: 365 10*3/uL (ref 150–400)
RBC: 4.31 MIL/uL (ref 3.87–5.11)
RDW: 13.3 % (ref 11.5–15.5)
WBC: 8.5 10*3/uL (ref 4.0–10.5)
nRBC: 0 % (ref 0.0–0.2)

## 2020-08-28 LAB — RESP PANEL BY RT-PCR (FLU A&B, COVID) ARPGX2
Influenza A by PCR: NEGATIVE
Influenza B by PCR: NEGATIVE
SARS Coronavirus 2 by RT PCR: NEGATIVE

## 2020-08-28 SURGERY — INCISION AND DRAINAGE
Anesthesia: General | Site: Knee | Laterality: Left

## 2020-08-28 MED ORDER — TETANUS-DIPHTH-ACELL PERTUSSIS 5-2.5-18.5 LF-MCG/0.5 IM SUSY
0.5000 mL | PREFILLED_SYRINGE | Freq: Once | INTRAMUSCULAR | Status: AC
  Administered 2020-08-28: 0.5 mL via INTRAMUSCULAR
  Filled 2020-08-28: qty 0.5

## 2020-08-28 MED ORDER — MIDAZOLAM HCL 2 MG/2ML IJ SOLN
INTRAMUSCULAR | Status: AC
  Filled 2020-08-28: qty 2

## 2020-08-28 MED ORDER — PROPOFOL 10 MG/ML IV BOLUS
INTRAVENOUS | Status: AC
  Filled 2020-08-28: qty 20

## 2020-08-28 MED ORDER — MORPHINE SULFATE (PF) 4 MG/ML IV SOLN
INTRAVENOUS | Status: AC
  Filled 2020-08-28: qty 2

## 2020-08-28 MED ORDER — HYDROCODONE-ACETAMINOPHEN 5-325 MG PO TABS: 1.0000 | ORAL_TABLET | Freq: Four times a day (QID) | ORAL | 0 refills | Status: DC | PRN

## 2020-08-28 MED ORDER — VANCOMYCIN HCL 1000 MG IV SOLR
INTRAVENOUS | Status: AC
  Filled 2020-08-28: qty 1000

## 2020-08-28 MED ORDER — ASPIRIN 81 MG PO CHEW: 81.0000 mg | CHEWABLE_TABLET | Freq: Every day | ORAL | 0 refills | Status: AC

## 2020-08-28 MED ORDER — OXYCODONE HCL 5 MG PO TABS: 5.0000 mg | ORAL_TABLET | Freq: Once | ORAL | Status: DC | PRN

## 2020-08-28 MED ORDER — BUPIVACAINE HCL (PF) 0.5 % IJ SOLN
INTRAMUSCULAR | Status: DC | PRN
  Administered 2020-08-28: 30 mL

## 2020-08-28 MED ORDER — ONDANSETRON HCL 4 MG/2ML IJ SOLN
INTRAMUSCULAR | Status: DC | PRN
  Administered 2020-08-28: 4 mg via INTRAVENOUS

## 2020-08-28 MED ORDER — FENTANYL CITRATE (PF) 100 MCG/2ML IJ SOLN
INTRAMUSCULAR | Status: AC
  Filled 2020-08-28: qty 2

## 2020-08-28 MED ORDER — HYDROMORPHONE HCL 1 MG/ML IJ SOLN
INTRAMUSCULAR | Status: AC
  Filled 2020-08-28: qty 1

## 2020-08-28 MED ORDER — BUPIVACAINE HCL (PF) 0.5 % IJ SOLN
INTRAMUSCULAR | Status: AC
  Filled 2020-08-28: qty 30

## 2020-08-28 MED ORDER — PROPOFOL 10 MG/ML IV BOLUS
INTRAVENOUS | Status: DC | PRN
  Administered 2020-08-28: 200 mg via INTRAVENOUS

## 2020-08-28 MED ORDER — DEXAMETHASONE SODIUM PHOSPHATE 10 MG/ML IJ SOLN
INTRAMUSCULAR | Status: DC | PRN
  Administered 2020-08-28: 5 mg via INTRAVENOUS

## 2020-08-28 MED ORDER — LIDOCAINE-EPINEPHRINE (PF) 2 %-1:200000 IJ SOLN
10.0000 mL | Freq: Once | INTRAMUSCULAR | Status: DC
  Filled 2020-08-28: qty 20

## 2020-08-28 MED ORDER — FENTANYL CITRATE (PF) 100 MCG/2ML IJ SOLN
50.0000 ug | Freq: Once | INTRAMUSCULAR | Status: AC
  Administered 2020-08-28: 50 ug via INTRAVENOUS
  Filled 2020-08-28: qty 2

## 2020-08-28 MED ORDER — CEFAZOLIN SODIUM-DEXTROSE 2-4 GM/100ML-% IV SOLN
2.0000 g | Freq: Once | INTRAVENOUS | Status: AC
  Administered 2020-08-28: 2 g via INTRAVENOUS
  Filled 2020-08-28 (×2): qty 100

## 2020-08-28 MED ORDER — PROMETHAZINE HCL 25 MG/ML IJ SOLN
6.2500 mg | INTRAMUSCULAR | Status: DC | PRN
Start: 2020-08-28 — End: 2020-08-29

## 2020-08-28 MED ORDER — HYDROMORPHONE HCL 1 MG/ML IJ SOLN
0.2500 mg | INTRAMUSCULAR | Status: DC | PRN
Start: 2020-08-28 — End: 2020-08-29
  Administered 2020-08-28 (×2): 0.5 mg via INTRAVENOUS

## 2020-08-28 MED ORDER — LIDOCAINE-EPINEPHRINE-TETRACAINE (LET) TOPICAL GEL
3.0000 mL | Freq: Once | TOPICAL | Status: DC
  Filled 2020-08-28: qty 3

## 2020-08-28 MED ORDER — AMISULPRIDE (ANTIEMETIC) 5 MG/2ML IV SOLN: 10.0000 mg | Freq: Once | INTRAVENOUS | Status: DC | PRN

## 2020-08-28 MED ORDER — FENTANYL CITRATE (PF) 100 MCG/2ML IJ SOLN
INTRAMUSCULAR | Status: DC | PRN
  Administered 2020-08-28 (×2): 50 ug via INTRAVENOUS

## 2020-08-28 MED ORDER — MIDAZOLAM HCL 2 MG/2ML IJ SOLN
INTRAMUSCULAR | Status: DC | PRN
  Administered 2020-08-28 (×2): 1 mg via INTRAVENOUS

## 2020-08-28 MED ORDER — CHLORHEXIDINE GLUCONATE 0.12 % MT SOLN
15.0000 mL | OROMUCOSAL | Status: AC
  Administered 2020-08-28: 15 mL via OROMUCOSAL

## 2020-08-28 MED ORDER — OXYCODONE HCL 5 MG/5ML PO SOLN: 5.0000 mg | Freq: Once | ORAL | Status: DC | PRN

## 2020-08-28 MED ORDER — MORPHINE SULFATE (PF) 4 MG/ML IV SOLN
INTRAVENOUS | Status: DC | PRN
  Administered 2020-08-28: 8 mg via INTRAVENOUS

## 2020-08-28 MED ORDER — CLONIDINE HCL (ANALGESIA) 100 MCG/ML EP SOLN
EPIDURAL | Status: AC
  Filled 2020-08-28: qty 10

## 2020-08-28 MED ORDER — CLONIDINE HCL (ANALGESIA) 100 MCG/ML EP SOLN
EPIDURAL | Status: DC | PRN
  Administered 2020-08-28: 1.5 mL

## 2020-08-28 MED ORDER — LACTATED RINGERS IV SOLN: INTRAVENOUS | Status: DC

## 2020-08-28 MED ORDER — LIDOCAINE HCL (CARDIAC) PF 100 MG/5ML IV SOSY
PREFILLED_SYRINGE | INTRAVENOUS | Status: DC | PRN
  Administered 2020-08-28: 60 mg via INTRAVENOUS

## 2020-08-28 SURGICAL SUPPLY — 17 items
BNDG CMPR MED 10X6 ELC LF (GAUZE/BANDAGES/DRESSINGS) ×1
BNDG ELASTIC 6X10 VLCR STRL LF (GAUZE/BANDAGES/DRESSINGS) ×1 IMPLANT
DRSG PAD ABDOMINAL 8X10 ST (GAUZE/BANDAGES/DRESSINGS) ×1 IMPLANT
EVACUATOR 3/16  PVC DRAIN (DRAIN) ×2
EVACUATOR 3/16 PVC DRAIN (DRAIN) IMPLANT
GAUZE SPONGE 4X4 12PLY STRL (GAUZE/BANDAGES/DRESSINGS) ×1 IMPLANT
GAUZE XEROFORM 1X8 LF (GAUZE/BANDAGES/DRESSINGS) ×1 IMPLANT
IMMOBILIZER KNEE 20 (SOFTGOODS) ×2
IMMOBILIZER KNEE 20 THIGH 36 (SOFTGOODS) IMPLANT
KIT BASIN OR (CUSTOM PROCEDURE TRAY) ×1 IMPLANT
PACK ORTHO EXTREMITY (CUSTOM PROCEDURE TRAY) ×1 IMPLANT
PADDING CAST ABS 6INX4YD NS (CAST SUPPLIES) ×1
PADDING CAST ABS COTTON 6X4 NS (CAST SUPPLIES) IMPLANT
PADDING CAST COTTON 6X4 STRL (CAST SUPPLIES) ×1 IMPLANT
SUT ETHILON 3 0 PS 1 (SUTURE) ×3 IMPLANT
SUT VIC AB 2-0 SH 27 (SUTURE) ×6
SUT VIC AB 2-0 SH 27X BRD (SUTURE) IMPLANT

## 2020-08-28 NOTE — Progress Notes (Signed)
Responded to fall from treadmill at [facility] today On floor complaining of bilateral knee pain  Significant would to right knee deep laceration (bleeding controlled) , scrapes noted to left shin. Ice applied  VS 90/50 75 97% O2- hyperventilation noted. Coached to slow breathing  Complained of lightheadness, being hot and sweaty. Lowered to floor with improvement of symptoms  EMS called, evaled and transported to [place]  Patient called husband who arrived prior to transport

## 2020-08-28 NOTE — Transfer of Care (Signed)
Immediate Anesthesia Transfer of Care Note  Patient: Tracey Castro  Procedure(s) Performed: INCISION AND DRAINAGE (Left )  Patient Location: PACU  Anesthesia Type:General  Level of Consciousness: awake, drowsy and patient cooperative  Airway & Oxygen Therapy: Patient Spontanous Breathing and Patient connected to face mask oxygen  Post-op Assessment: Report given to RN and Post -op Vital signs reviewed and stable  Post vital signs: Reviewed and stable  Last Vitals:  Vitals Value Taken Time  BP 104/51 2040  Temp    Pulse 69   Resp    SpO2 100%     Last Pain:  Vitals:   08/28/20 1849  TempSrc: Oral  PainSc:          Complications: No complications documented.

## 2020-08-28 NOTE — Brief Op Note (Signed)
   08/28/2020  8:41 PM  PATIENT:  Tracey Castro  75 y.o. female  PRE-OPERATIVE DIAGNOSIS:  LEFT KNEE LACERATION  POST-OPERATIVE DIAGNOSIS:  LEFT KNEE LACERATION  PROCEDURE:  Procedure(s): INCISION AND DRAINAGE  SURGEON:  Surgeon(s): Marlou Sa, Tonna Corner, MD  ASSISTANT: magnant pa  ANESTHESIA:   general  EBL: 5 ml    Total I/O In: 700 [I.V.:700] Out: 10 [Blood:10]  BLOOD ADMINISTERED: none  DRAINS: (left) Hemovact drain(s) in the knee with  Suction Open   LOCAL MEDICATIONS USED:  Marcaine mso4 clonidine  SPECIMEN:  No Specimen  COUNTS:  YES  TOURNIQUET:  * No tourniquets in log *  DICTATION: .Other Dictation: Dictation Number [id number]  PLAN OF CARE: Discharge to home after PACU  PATIENT DISPOSITION:  PACU - hemodynamically stable

## 2020-08-28 NOTE — ED Notes (Signed)
Short stay in room prepping patient for OR

## 2020-08-28 NOTE — ED Notes (Signed)
Report given to short stay. Clothing and jewelry removed. Short stay will come get patient.

## 2020-08-28 NOTE — ED Triage Notes (Signed)
Pt arrived via EMS, was running on treadmill at local [employer], mechanical fall, laceration to left knee, bleeding controlled, +(CSM) Denies any other injury.

## 2020-08-28 NOTE — ED Provider Notes (Signed)
[facility] DEPT Provider Note   CSN: [id number] Arrival date & time: 08/28/20  1206     History Chief Complaint  Patient presents with  . Fall  . Laceration    Tracey Castro is a 75 y.o. female history of MAC, hyperlipidemia.  Patient presents today for evaluation of left knee pain she was on the treadmill just prior to arrival slipped off hitting her left knee on the edge.  She suffered a laceration of the left knee she reports pain is sharp constant moderate intensity nonradiating no alleviating factors no medications prior to arrival pain is worsened with palpation.  Bleeding was controlled with direct pressure prior to arrival.  She also reports an abrasion to her right shin.  Unknown when her last tetanus shot was.  Denies head injury, loss of consciousness, blood thinner use, headache, neck pain, chest pain, abdominal pain, back pain, numbness/weakness, tingling or any additional concerns.  HPI     Past Medical History:  Diagnosis Date  . Hyperlipidemia   . Mycobacterium avium complex [facility]     Patient Active Problem List   Diagnosis Date Noted  . COPD GOLD  0/ AB  03/19/2018  . Bronchiectasis without complication ([place]) 63/84/5364  . 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 Cough 11/11/2017  . Hoarseness of voice 11/11/2017  . Rhinitis, chronic 11/11/2017    Past Surgical History:  Procedure Laterality Date  . BREAST CYST ASPIRATION    . VIDEO BRONCHOSCOPY Bilateral 03/27/2018   Procedure: VIDEO BRONCHOSCOPY WITHOUT FLUORO;  Surgeon: Tanda Rockers, MD;  Location: [facility];  Service: Cardiopulmonary;  Laterality: Bilateral;     OB History   No obstetric history on file.     Family History  Problem Relation Age of Onset  . Cancer - Ovarian Mother   . Cancer - Colon Father   . Parkinson's disease Father     Social History   Tobacco Use  . Smoking status: Never Smoker  . Smokeless tobacco: Never Used   Vaping Use  . Vaping Use: Never used  Substance Use Topics  . Alcohol use: Yes    Alcohol/week: 4.0 standard drinks    Types: 4 Glasses of wine per week  . Drug use: Never    Home Medications Prior to Admission medications   Medication Sig Start Date End Date Taking? Authorizing Provider  Cholecalciferol (VITAMIN D) 2000 units tablet Take 4,000-6,000 Units by mouth daily.    [provider]  diphenhydrAMINE HCl, Sleep, 25 MG CAPS Take by mouth. prn    [provider]  estradiol (ESTRACE) 0.1 MG/GM vaginal cream USE ONCE A NIGHT FOR 2 WEEKS, THEN SPACE OUT TO TWICE A WEEK 12/16/18   [provider]  fluticasone (FLONASE) 50 MCG/ACT nasal spray Place 1 spray into the nose daily as needed for allergies.     [provider]  fluticasone furoate-vilanterol (BREO ELLIPTA) 100-25 MCG/INH AEPB Inhale 1 puff into the lungs daily. 10/28/19   Rigoberto Noel, MD  ibuprofen (ADVIL,MOTRIN) 200 MG tablet Take 400 mg by mouth 2 (two) times daily as needed for headache or moderate pain.    [provider]  Magnesium 500 MG CAPS Take by mouth.    [provider]  Multiple Vitamin (MULTIVITAMIN) capsule Take 1 capsule by mouth daily.    [provider]    Allergies    Sulfamethoxazole  Review of Systems   Review of Systems Ten systems are reviewed and are negative for  acute change except as noted in the HPI  Physical Exam Updated Vital Signs BP 122/64   Pulse 72   Temp 98.4 F (36.9 C) (Oral)   Resp 20   SpO2 95%   Physical Exam Constitutional:      General: She is not in acute distress.    Appearance: Normal appearance. She is well-developed. She is not ill-appearing or diaphoretic.  HENT:     Head: Normocephalic and atraumatic.  Eyes:     General: Vision grossly intact. Gaze aligned appropriately.     Pupils: Pupils are equal, round, and reactive to light.  Neck:     Trachea: Trachea and phonation normal.  Pulmonary:      Effort: Pulmonary effort is normal. No respiratory distress.  Abdominal:     General: There is no distension.     Palpations: Abdomen is soft.     Tenderness: There is no abdominal tenderness. There is no guarding or rebound.  Musculoskeletal:        General: Normal range of motion.     Cervical back: Normal range of motion.     Right knee: Normal.     Left knee: Laceration present. Normal range of motion.     Right lower leg: Tenderness (Abrasion) present.     Comments: No midline C/T/L spinal tenderness to palpation, no paraspinal muscle tenderness, no deformity, crepitus, or step-off noted. No sign of injury to the neck or back. - Pelvis stable to compression bilateral without pain. - Full range of motion of all major joints of the left upper extremity without pain - Full range of motion appropriate strength with the right shoulder without pain.  Some increased pain with movement and palpation of the right olecranon no overlying skin changes or deformity.  Appropriate range of motion and strength of the right elbow for age.  No pain with range of motion at the right wrist or hand. - Full range of motion of all major joints of the right lower extremity without pain.  Some abrasion present at the right shin. - Full range of motion at the left hip ankle and foot without pain.  Large laceration overlying the left knee see picture below.  Extension and flexion intact of the left knee with some increased pain peer  Skin:    General: Skin is warm and dry.  Neurological:     Mental Status: She is alert.     GCS: GCS eye subscore is 4. GCS verbal subscore is 5. GCS motor subscore is 6.     Comments: Speech is clear and goal oriented, follows commands Major Cranial nerves without deficit, no facial droop Moves extremities without ataxia, coordination intact  Psychiatric:        Behavior: Behavior normal.          ED Results / Procedures / Treatments   Labs (all labs ordered are  listed, but only abnormal results are displayed) Labs Reviewed  BASIC METABOLIC PANEL - Abnormal; Notable for the following components:      Result Value   Glucose, Bld 117 (*)    All other components within normal limits  RESP PANEL BY RT-PCR (FLU A&B, COVID) [id number]  CBC WITH DIFFERENTIAL/PLATELET    EKG None  Radiology DG Knee Complete 4 Views Left  Result Date: 08/28/2020 CLINICAL DATA:  Fall with laceration of left anterior knee overlying the patella. EXAM: LEFT KNEE - COMPLETE 4+ VIEW COMPARISON:  None. FINDINGS: No acute fracture, dislocation or joint effusion evident.  On the lateral view, an anterior soft tissue wound and defect is noted anterior to the mid patella which nearly reaches the level of the anterior patellar cortex. Subcutaneous air present at the level of the wound and just inferior to the wound. No soft tissue foreign body identified. IMPRESSION: Anterior soft tissue wound and defect anterior to the mid patella which nearly reaches the level of the anterior patellar cortex. Subcutaneous air present at the level of the wound and just inferior to the wound. No acute fracture identified. Electronically Signed   By: Aletta Edouard M.D.   On: 08/28/2020 13:58    Procedures Procedures   Medications Ordered in ED Medications  lidocaine-EPINEPHrine (XYLOCAINE W/EPI) 2 %-1:200000 (PF) injection 10 mL (has no administration in time range)  ceFAZolin (ANCEF) IVPB 2g/100 mL premix (has no administration in time range)  Tdap (BOOSTRIX) injection 0.5 mL (0.5 mLs Intramuscular Given 08/28/20 1321)  fentaNYL (SUBLIMAZE) injection 50 mcg (50 mcg Intravenous Given 08/28/20 1320)    ED Course  I have reviewed the triage vital signs and the nursing notes.  Pertinent labs & imaging results that were available during my care of the patient were reviewed by me and considered in my medical decision making (see chart for details).    MDM Rules/Calculators/A&P                          Additional history obtained from: 1. Nursing notes from this visit. 2. Review of EMR. 3. Patient's husband at bedside. ------------------ 75 year old female arrived with large laceration overlying the left knee.  She also has a small abrasion of the anterior right lower leg and a small bruise to the olecranon of the right elbow.  No significant tenderness overlying the right lower leg abrasion indication for imaging at that area.  Patient full range of motion and strength of the right elbow with minimal pain offered patient x-ray of the right elbow but she refused, feel patient's request is reasonable she is neurovascular intact distally.  There is no evidence of significant injury of the head neck back chest abdomen or pelvis to require imaging of those areas.  Patient is neurovascular intact distal to her left knee laceration, this is a very deep laceration and there is bone present.  She has good extension and flexion at the left knee but does have some increased pain there.  Patient was given Ancef and pain medication.  Consult was placed to orthopedics spoke with Dr. Marlou Sa as well as his APP Lurena Joiner.  They advised they will bring patient to the OR for washout and repair around 5 PM today.  I reassessed the patient she is resting comfortably reports pain controlled after pain medication she states understanding of care plan and has no questions or concerns.  I have added CBC BMP and Covid test to patient's work-up.  Tetanus was updated today.  CBC within normal limits, no leukocytosis, anemia or thrombocytopenia BMP shows no emergent Electra derangement, AKI or gap. Covid/influenza panel pending.  Care handoff given to Providence Lanius, PA-C at shift change, plan is to observe patient until she is taken to the OR by orthopedics.  Note: Portions of this report may have been transcribed using voice recognition software. Every effort was made to ensure accuracy; however, inadvertent computerized  transcription errors may still be present. Final Clinical Impression(s) / ED Diagnoses Final diagnoses:  Fall, initial encounter  Knee laceration, left, initial encounter  Abrasion of right  lower extremity, initial encounter  Right elbow pain    Rx / DC Orders ED Discharge Orders    None       Gari Crown 08/28/20 1524    Milton Ferguson, MD 08/29/20 [phone]

## 2020-08-28 NOTE — H&P (Signed)
Tracey Castro is an 75 y.o. female.   Chief Complaint: Left knee pain HPI: Tracey Castro is a 75 year old female who was walking on the treadmill today at the [employer] [facility] [facility] [employer] [facility] when she fell and sustained a knee laceration.  She was evaluated in the emergency department.  Complex laceration was present down to the periosteum of the patella.  Presents now for operative management of complex large wound over the knee.  She does have good family support at home.  No personal or family history of DVT or pulmonary embolism.  Past Medical History:  Diagnosis Date  . Hyperlipidemia   . Mycobacterium avium complex [facility])     Past Surgical History:  Procedure Laterality Date  . BREAST CYST ASPIRATION    . VIDEO BRONCHOSCOPY Bilateral 03/27/2018   Procedure: VIDEO BRONCHOSCOPY WITHOUT FLUORO;  Surgeon: Tanda Rockers, MD;  Location: [place];  Service: Cardiopulmonary;  Laterality: Bilateral;    Family History  Problem Relation Age of Onset  . Cancer - Ovarian Mother   . Cancer - Colon Father   . Parkinson's disease Father    Social History:  reports that she has never smoked. She has never used smokeless tobacco. She reports current alcohol use of about 4.0 standard drinks of alcohol per week. She reports that she does not use drugs.  Allergies:  Allergies  Allergen Reactions  . Sulfamethoxazole Rash    Medications Prior to Admission  Medication Sig Dispense Refill  . Cholecalciferol (VITAMIN D) 2000 units tablet Take 4,000-6,000 Units by mouth daily.    . diphenhydrAMINE HCl, Sleep, 25 MG CAPS Take 25 mg by mouth daily as needed (sleep).    Marland Kitchen estradiol (ESTRACE) 0.1 MG/GM vaginal cream Place 1 Applicatorful vaginally daily as needed (infection).    . fluticasone (FLONASE) 50 MCG/ACT nasal spray Place 1 spray into both nostrils daily as needed for allergies.    . fluticasone furoate-vilanterol (BREO ELLIPTA) 100-25 MCG/INH AEPB Inhale 1 puff into the lungs daily. 180 each 3  . ibuprofen  (ADVIL,MOTRIN) 200 MG tablet Take 400 mg by mouth 2 (two) times daily as needed for headache or moderate pain.    . Magnesium 500 MG CAPS Take 500 mg by mouth daily.    . Multiple Vitamin (MULTIVITAMIN) capsule Take 1 capsule by mouth daily.    Marland Kitchen tretinoin (RETIN-A) 0.025 % cream Apply 1 application topically at bedtime.      Results for orders placed or performed during the hospital encounter of 08/28/20 (from the past 48 hour(s))  CBC with Differential     Status: None   Collection Time: 08/28/20  2:28 PM  Result Value Ref Range   WBC 8.5 4.0 - 10.5 K/uL   RBC 4.31 3.87 - 5.11 MIL/uL   Hemoglobin 12.1 12.0 - 15.0 g/dL   HCT 37.7 36.0 - 46.0 %   MCV 87.5 80.0 - 100.0 fL   MCH 28.1 26.0 - 34.0 pg   MCHC 32.1 30.0 - 36.0 g/dL   RDW 13.3 11.5 - 15.5 %   Platelets 365 150 - 400 K/uL   nRBC 0.0 0.0 - 0.2 %   Neutrophils Relative % 62 %   Neutro Abs 5.4 1.7 - 7.7 K/uL   Lymphocytes Relative 26 %   Lymphs Abs 2.2 0.7 - 4.0 K/uL   Monocytes Relative 9 %   Monocytes Absolute 0.8 0.1 - 1.0 K/uL   Eosinophils Relative 1 %   Eosinophils Absolute 0.1 0.0 - 0.5 K/uL   Basophils Relative 1 %  Basophils Absolute 0.1 0.0 - 0.1 K/uL   Immature Granulocytes 1 %   Abs Immature Granulocytes 0.04 0.00 - 0.07 K/uL    Comment: Performed at [facility], [place] [address] [place], [place] [address]  Basic metabolic panel     Status: Abnormal   Collection Time: 08/28/20  2:28 PM  Result Value Ref Range   Sodium 136 135 - 145 mmol/L   Potassium 3.7 3.5 - 5.1 mmol/L   Chloride 104 98 - 111 mmol/L   CO2 22 22 - 32 mmol/L   Glucose, Bld 117 (H) 70 - 99 mg/dL    Comment: Glucose reference range applies only to samples taken after fasting for at least 8 hours.   BUN 17 8 - 23 mg/dL   Creatinine, Ser 0.75 0.44 - 1.00 mg/dL   Calcium 9.3 8.9 - 10.3 mg/dL   GFR, Estimated >60 >60 mL/min    Comment: (NOTE) Calculated using the CKD-EPI Creatinine Equation (2021)    Anion gap 10 5 - 15     Comment: Performed at [facility], [place] [address], [place], [place] [address]  Resp Panel by RT-PCR (Flu A&B, Covid) Nasopharyngeal Swab     Status: None   Collection Time: 08/28/20  2:47 PM   Specimen: Nasopharyngeal Swab; Nasopharyngeal(NP) swabs in vial transport medium  Result Value Ref Range   SARS Coronavirus 2 by RT PCR NEGATIVE NEGATIVE    Comment: (NOTE) SARS-CoV-2 target nucleic acids are NOT DETECTED.  The SARS-CoV-2 RNA is generally detectable in upper respiratory specimens during the acute phase of infection. The lowest concentration of SARS-CoV-2 viral copies this assay can detect is 138 copies/mL. A negative result does not preclude SARS-Cov-2 infection and should not be used as the sole basis for treatment or other patient management decisions. A negative result may occur with  improper specimen collection/handling, submission of specimen other than nasopharyngeal swab, presence of viral mutation(s) within the areas targeted by this assay, and inadequate number of viral copies(<138 copies/mL). A negative result must be combined with clinical observations, patient history, and epidemiological information. The expected result is Negative.  Fact Sheet for Patients:  EntrepreneurPulse.com.au  Fact Sheet for Healthcare Providers:  IncredibleEmployment.be  This test is no t yet approved or cleared by the [place] FDA and  has been authorized for detection and/or diagnosis of SARS-CoV-2 by FDA under an Emergency Use Authorization (EUA). This EUA will remain  in effect (meaning this test can be used) for the duration of the COVID-19 declaration under Section 564(b)(1) of the Act, 21 U.S.C.section 360bbb-3(b)(1), unless the authorization is terminated  or revoked sooner.       Influenza A by PCR NEGATIVE NEGATIVE   Influenza B by PCR NEGATIVE NEGATIVE    Comment: (NOTE) The Xpert Xpress SARS-CoV-2/FLU/RSV  plus assay is intended as an aid in the diagnosis of influenza from Nasopharyngeal swab specimens and should not be used as a sole basis for treatment. Nasal washings and aspirates are unacceptable for Xpert Xpress SARS-CoV-2/FLU/RSV testing.  Fact Sheet for Patients: EntrepreneurPulse.com.au  Fact Sheet for Healthcare Providers: IncredibleEmployment.be  This test is not yet approved or cleared by the [place] FDA and has been authorized for detection and/or diagnosis of SARS-CoV-2 by FDA under an Emergency Use Authorization (EUA). This EUA will remain in effect (meaning this test can be used) for the duration of the COVID-19 declaration under Section 564(b)(1) of the Act, 21 U.S.C. section 360bbb-3(b)(1), unless the authorization is terminated  or revoked.  Performed at [facility], [place] [address] [place], [place] [address]    DG Knee Complete 4 Views Left  Result Date: 08/28/2020 CLINICAL DATA:  Fall with laceration of left anterior knee overlying the patella. EXAM: LEFT KNEE - COMPLETE 4+ VIEW COMPARISON:  None. FINDINGS: No acute fracture, dislocation or joint effusion evident. On the lateral view, an anterior soft tissue wound and defect is noted anterior to the mid patella which nearly reaches the level of the anterior patellar cortex. Subcutaneous air present at the level of the wound and just inferior to the wound. No soft tissue foreign body identified. IMPRESSION: Anterior soft tissue wound and defect anterior to the mid patella which nearly reaches the level of the anterior patellar cortex. Subcutaneous air present at the level of the wound and just inferior to the wound. No acute fracture identified. Electronically Signed   By: Aletta Edouard M.D.   On: 08/28/2020 13:58    Review of Systems  Musculoskeletal: Positive for arthralgias.  All other systems reviewed and are negative.   Blood pressure (!) 110/56,  pulse 77, temperature (!) 96.8 F (36 C), temperature source Oral, resp. rate 16, height _0  (1.575 m), weight 65.9 kg, SpO2 96 %. Physical Exam Vitals reviewed.  HENT:     Head: Normocephalic.     Nose: Nose normal.     Mouth/Throat:     Mouth: Mucous membranes are moist.  Eyes:     Pupils: Pupils are equal, round, and reactive to light.  Cardiovascular:     Rate and Rhythm: Normal rate.     Pulses: Normal pulses.  Pulmonary:     Effort: Pulmonary effort is normal.  Abdominal:     General: Abdomen is flat.  Musculoskeletal:     Cervical back: Normal range of motion.  Skin:    General: Skin is warm.     Capillary Refill: Capillary refill takes less than 2 seconds.  Neurological:     General: No focal deficit present.     Mental Status: She is alert.  Psychiatric:        Mood and Affect: Mood normal.   Examination of the left knee demonstrates transverse laceration measuring approximately 10 cm over the inferior pole of the patella.  No knee effusion.  Pedal pulses palpable.  No gross contamination but there is full-thickness dermal laceration present.  No groin pain on the left.  Assessment/Plan Impression is significant laceration following treadmill fall.  Discussed options including ER closure versus operative debridement and closure over a drain with antibiotic powder.  I think in general for this type of laceration there is less of a chance for infection if we can do a thorough washout under good anesthesia with debridement with a curette and loose closure over a drain with vancomycin powder.  Risk benefits are discussed.  Planned postoperative course also discussed.  All questions answered.  Anderson Malta, MD 08/28/2020, 7:37 PM

## 2020-08-28 NOTE — Anesthesia Preprocedure Evaluation (Signed)
Anesthesia Evaluation  Patient identified by MRN, date of birth, ID band Patient awake    Reviewed: Allergy & Precautions, NPO status , Patient's Chart, lab work & pertinent test results  Airway Mallampati: II  TM Distance: >3 FB Neck ROM: Full    Dental no notable dental hx.    Pulmonary COPD,    Pulmonary exam normal breath sounds clear to auscultation       Cardiovascular negative cardio ROS Normal cardiovascular exam Rhythm:Regular Rate:Normal     Neuro/Psych negative neurological ROS  negative psych ROS   GI/Hepatic negative GI ROS, Neg liver ROS,   Endo/Other  negative endocrine ROS  Renal/GU negative Renal ROS  negative genitourinary   Musculoskeletal negative musculoskeletal ROS (+)   Abdominal   Peds negative pediatric ROS (+)  Hematology negative hematology ROS (+)   Anesthesia Other Findings   Reproductive/Obstetrics negative OB ROS                             Anesthesia Physical Anesthesia Plan  ASA: II  Anesthesia Plan: General   Post-op Pain Management:    Induction: Intravenous  PONV Risk Score and Plan: 3 and Ondansetron, Dexamethasone, Midazolam and Treatment may vary due to age or medical condition  Airway Management Planned: LMA  Additional Equipment:   Intra-op Plan:   Post-operative Plan: Extubation in OR  Informed Consent: I have reviewed the patients History and Physical, chart, labs and discussed the procedure including the risks, benefits and alternatives for the proposed anesthesia with the patient or authorized representative who has indicated his/her understanding and acceptance.     Dental advisory given  Plan Discussed with: CRNA  Anesthesia Plan Comments:         Anesthesia Quick Evaluation

## 2020-08-28 NOTE — Anesthesia Procedure Notes (Signed)
Procedure Name: LMA Insertion Date/Time: 08/28/2020 7:38 PM Performed by: Raenette Rover, CRNA Pre-anesthesia Checklist: Patient identified, Emergency Drugs available, Suction available and Patient being monitored Patient Re-evaluated:Patient Re-evaluated prior to induction Oxygen Delivery Method: Circle system utilized Preoxygenation: Pre-oxygenation with 100% oxygen Induction Type: IV induction Ventilation: Mask ventilation without difficulty LMA: LMA inserted LMA Size: 4.0 Number of attempts: 1 Placement Confirmation: positive ETCO2 and breath sounds checked- equal and bilateral Tube secured with: Tape Dental Injury: Teeth and Oropharynx as per pre-operative assessment

## 2020-08-28 NOTE — Anesthesia Postprocedure Evaluation (Signed)
Anesthesia Post Note  Patient: Anasofia Micallef  Procedure(s) Performed: INCISION AND DRAINAGE OF LEFT KNEE LACERATION (Left Knee)     Patient location during evaluation: PACU Anesthesia Type: General Level of consciousness: awake and alert Pain management: pain level controlled Vital Signs Assessment: post-procedure vital signs reviewed and stable Respiratory status: spontaneous breathing, nonlabored ventilation and respiratory function stable Cardiovascular status: blood pressure returned to baseline and stable Postop Assessment: no apparent nausea or vomiting Anesthetic complications: no   No complications documented.  Last Vitals:  Vitals:   08/28/20 1849 08/28/20 2045  BP: (!) 110/56   Pulse: 77   Resp: 16   Temp: (!) 36 C 36.4 C  SpO2:      Last Pain:  Vitals:   08/28/20 2115  TempSrc:   PainSc: (P) 3                  Lynda Rainwater

## 2020-08-29 ENCOUNTER — Ambulatory Visit: Payer: Medicare Other

## 2020-08-29 ENCOUNTER — Encounter (HOSPITAL_COMMUNITY): Payer: Self-pay | Admitting: Orthopedic Surgery

## 2020-08-29 NOTE — Op Note (Signed)
[name], Tracey Castro MEDICAL RECORD NO: [id number] ACCOUNT NO: [id number] DATE OF BIRTH: 12/17/1945 FACILITY: [facility] LOCATION: [facility] PHYSICIAN: Yetta Barre. Marlou Sa, MD  Operative Report   DATE OF PROCEDURE: 08/28/2020  PREOPERATIVE DIAGNOSIS:  Complex 12 cm left knee laceration.  POSTOPERATIVE DIAGNOSIS:  Complex 12 cm left knee laceration.  PROCEDURE:  Irrigation and excisional debridement of skin, subcutaneous tissue and fascia associated with complex left knee laceration with placement of Hemovac drain and vancomycin powder and loose primary closure.  SURGEON ATTENDING:  Meredith Pel, MD  ASSISTANT:  Annie Main, PA  INDICATIONS:  The patient is a 75 year old patient who was on the treadmill today when she fell and injured her left knee.  Denies any other orthopedic complaints, but does report left leg laceration.  Evaluated in the emergency department, felt to be  fairly complex for ED closure, presents now for operative management after explanation of risks and benefits.  DESCRIPTION OF PROCEDURE:  The patient was brought to the operating room where general anesthetic was induced, perioperative and preoperative IV antibiotics were administered.  Patient timeout was called.  Left leg was prepped with chlorhexidine solution  and draped in a sterile manner.  The incision initially was debrided using a knife, debriding devitalized appearing skin, subcutaneous tissue and fascia.  Using a curette, all exposed surfaces were curetted to remove any biofilm.  This actually extended  to the mid coronal plane of the proximal tibia.  No gross contamination was present.  Following excisional debridement, thorough irrigation was performed with 3 liters of irrigating solution.  Next, a Hemovac drain was placed.  Vancomycin powder also  placed.  The incision was then loosely closed in layers using 2-0 Vicryl and 3-0 nylon.  Xeroform placed along with 4 x 4's, ABDs and Webril, and a knee  immobilizer.  It should be noted that the skin edges were anesthetized using Marcaine, morphine,  clonidine.  The patient tolerated the procedure well without immediate complications, transferred to the recovery room in stable condition.  She will be weightbearing as tolerated in the knee immobilizer and plan for drain removal tomorrow in clinic.   Luke's assistance was required for opening, closing, mobilization of tissue.  His assistance was a medical necessity.   SHW D: 08/28/2020 8:41:41 pm T: 08/29/2020 9:23:00 am  JOB: [id number]/ [id number]

## 2020-09-01 ENCOUNTER — Telehealth: Payer: Self-pay | Admitting: Orthopedic Surgery

## 2020-09-01 NOTE — Telephone Encounter (Signed)
IC advised per Dr Marlou Sa ok to d/c bandage, ok to shower but no submerging knee, ok to d/c knee immobilizer and WTBAT

## 2020-09-01 NOTE — Telephone Encounter (Signed)
Pt would also like to know if she can also be seen earlier? She said the 28th will be two weeks for her.

## 2020-09-01 NOTE — Telephone Encounter (Signed)
Put said its hard to walk in the brace she has now so she's wondering if she can have a smaller one?  CB [phone]

## 2020-09-08 ENCOUNTER — Telehealth: Payer: Self-pay | Admitting: Orthopedic Surgery

## 2020-09-08 NOTE — Telephone Encounter (Signed)
IC s/w patient.

## 2020-09-08 NOTE — Telephone Encounter (Signed)
Patient called concerning changing the bandages. Patient advised do not want to have the same bandages on over the weekend. Please see previous note from patient. The number to contact patient is [phone]

## 2020-09-08 NOTE — Telephone Encounter (Signed)
IC advised ok to change bandage to dry dressing

## 2020-09-08 NOTE — Telephone Encounter (Signed)
Patient called with medical questions about her bandages. Patient states she had surgery 2 wks ago and would like to change out her bandages. She states her leg is starting to swell and leg seems to be healing good but bandages are needing to be changed. Pt states she has an appt this coming Monday but want a call back before the weekend. Please call patient at [phone].

## 2020-09-11 ENCOUNTER — Other Ambulatory Visit: Payer: Self-pay

## 2020-09-11 ENCOUNTER — Ambulatory Visit (INDEPENDENT_AMBULATORY_CARE_PROVIDER_SITE_OTHER): Payer: Medicare Other

## 2020-09-11 ENCOUNTER — Ambulatory Visit (INDEPENDENT_AMBULATORY_CARE_PROVIDER_SITE_OTHER): Payer: Medicare Other | Admitting: Orthopedic Surgery

## 2020-09-11 DIAGNOSIS — M542 Cervicalgia: Secondary | ICD-10-CM

## 2020-09-11 DIAGNOSIS — S81012A Laceration without foreign body, left knee, initial encounter: Secondary | ICD-10-CM

## 2020-09-12 ENCOUNTER — Encounter: Payer: Self-pay | Admitting: Orthopedic Surgery

## 2020-09-12 ENCOUNTER — Other Ambulatory Visit: Payer: Self-pay | Admitting: Internal Medicine

## 2020-09-12 DIAGNOSIS — Z1231 Encounter for screening mammogram for malignant neoplasm of breast: Secondary | ICD-10-CM

## 2020-09-12 NOTE — Progress Notes (Signed)
Post-Op Visit Note   Patient: Tracey Castro           Date of Birth: [birth year]           MRN: [id number] Visit Date: 09/11/2020 PCP: Glendale Chard, MD   Assessment & Plan:  Chief Complaint:  Chief Complaint  Patient presents with  . Left Knee - Routine Post Op    08/28/20 left knee I&D   Visit Diagnoses:  1. Knee laceration, left, initial encounter   2. Neck discomfort     Plan: Patient is a 75 year old female who presents s/p left knee laceration irrigation and debridement with laceration closure on 08/28/2020.  She is currently full weightbearing.  She discontinued her knee immobilizer.  She notes minimal pain.  Denies any fevers, chills, night sweats, drainage from the incision.  Sutures are still intact.  The sutures were removed today and replaced with Steri-Strips.  There is no expressible drainage and no significant tenderness around the incision.  No evidence of dehiscence.  Does have a small amount of erythema but this is likely secondary to sutures being in for an extended period of time.  She has 0 degrees of extension and about 70 degrees of knee flexion.  Able to do a couple straight leg raises.  No calf tenderness.  Negative Homans' sign.  Plan for patient to continue working on flexion.  Follow-up in 2 weeks for clinical recheck regarding her incision and knee range of motion.  Patient also complains of popping in her neck over the last several weeks that she is noticed more often.  Denies any neck pain, radicular pain, numbness/tingling.  No history of previous neck problem.  Has never had neck surgery.  Radiographs were taken today which revealed slight loss of lordosis with degenerative disc disease of the cervical spine without any acute changes.  Regarding her neck, cautioned against any advanced intervention at this time given her lack of significant symptoms aside from occasional popping in the neck.  She has excellent strength on exam today of bilateral upper  extremities and no tenderness along the cervical spine.  Plan to have patient follow-up as needed regarding her neck.  She will follow-up in 2 weeks for her knee.  Follow-Up Instructions: No follow-ups on file.   Orders:  Orders Placed This Encounter  Procedures  . XR Cervical Spine 2 or 3 views   No orders of the defined types were placed in this encounter.   Imaging: No results found.  PMFS History: Patient Active Problem List   Diagnosis Date Noted  . COPD GOLD  0/ AB  03/19/2018  . Bronchiectasis without complication ([place]) 94/17/4081  . 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 Cough 11/11/2017  . Hoarseness of voice 11/11/2017  . Rhinitis, chronic 11/11/2017   Past Medical History:  Diagnosis Date  . Hyperlipidemia   . Mycobacterium avium complex ([place])     Family History  Problem Relation Age of Onset  . Cancer - Ovarian Mother   . Cancer - Colon Father   . Parkinson's disease Father     Past Surgical History:  Procedure Laterality Date  . BREAST CYST ASPIRATION    . INCISION AND DRAINAGE Left 08/28/2020   Procedure: INCISION AND DRAINAGE OF LEFT KNEE LACERATION;  Surgeon: Meredith Pel, MD;  Location: [facility];  Service: Orthopedics;  Laterality: Left;  Marland Kitchen VIDEO BRONCHOSCOPY Bilateral 03/27/2018   Procedure: VIDEO BRONCHOSCOPY WITHOUT FLUORO;  Surgeon: Tanda Rockers, MD;  Location: [facility];  Service: Cardiopulmonary;  Laterality: Bilateral;   Social History   Occupational History  . Occupation: retired  Tobacco Use  . Smoking status: Never Smoker  . Smokeless tobacco: Never Used  Vaping Use  . Vaping Use: Never used  Substance and Sexual Activity  . Alcohol use: Yes    Alcohol/week: 4.0 standard drinks    Types: 4 Glasses of wine per week  . Drug use: Never  . Sexual activity: Yes

## 2020-09-17 ENCOUNTER — Encounter: Payer: Self-pay | Admitting: Orthopedic Surgery

## 2020-09-25 ENCOUNTER — Ambulatory Visit (INDEPENDENT_AMBULATORY_CARE_PROVIDER_SITE_OTHER): Payer: Medicare Other | Admitting: Orthopedic Surgery

## 2020-09-25 ENCOUNTER — Other Ambulatory Visit: Payer: Self-pay

## 2020-09-25 DIAGNOSIS — S81012A Laceration without foreign body, left knee, initial encounter: Secondary | ICD-10-CM

## 2020-09-28 ENCOUNTER — Encounter: Payer: Self-pay | Admitting: Orthopedic Surgery

## 2020-09-28 NOTE — Progress Notes (Signed)
   Post-Op Visit Note   Patient: Tracey Castro           Date of Birth: 02/14/1946           MRN: [id number] Visit Date: 09/25/2020 PCP: Glendale Chard, MD   Assessment & Plan:  Chief Complaint:  Chief Complaint  Patient presents with  . Left Knee - Routine Post Op   Visit Diagnoses:  1. Knee laceration, left, initial encounter     Plan: Jarryd presents in a month out left knee I&D.  She is doing well.  On exam the laceration is healing nicely.  No erythema or fluctuance or effusion in the knee or around the incision.  Knee range of motion is excellent.  I think she is okay for the bite at this time.  All should be okay in a couple of weeks.  Warning signs for recurrent infection discussed.  Follow-up as needed  Follow-Up Instructions: Return if symptoms worsen or fail to improve.   Orders:  No orders of the defined types were placed in this encounter.  No orders of the defined types were placed in this encounter.   Imaging: No results found.  PMFS History: Patient Active Problem List   Diagnosis Date Noted  . COPD GOLD  0/ AB  03/19/2018  . Bronchiectasis without complication ([place]) 86/76/7209  . 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 Cough 11/11/2017  . Hoarseness of voice 11/11/2017  . Rhinitis, chronic 11/11/2017   Past Medical History:  Diagnosis Date  . Hyperlipidemia   . Mycobacterium avium complex ([place])     Family History  Problem Relation Age of Onset  . Cancer - Ovarian Mother   . Cancer - Colon Father   . Parkinson's disease Father     Past Surgical History:  Procedure Laterality Date  . BREAST CYST ASPIRATION    . INCISION AND DRAINAGE Left 08/28/2020   Procedure: INCISION AND DRAINAGE OF LEFT KNEE LACERATION;  Surgeon: Meredith Pel, MD;  Location: [facility];  Service: Orthopedics;  Laterality: Left;  Marland Kitchen VIDEO BRONCHOSCOPY Bilateral 03/27/2018   Procedure: VIDEO BRONCHOSCOPY WITHOUT FLUORO;  Surgeon: Tanda Rockers, MD;  Location: [facility];  Service: Cardiopulmonary;  Laterality: Bilateral;   Social History   Occupational History  . Occupation: retired  Tobacco Use  . Smoking status: Never Smoker  . Smokeless tobacco: Never Used  Vaping Use  . Vaping Use: Never used  Substance and Sexual Activity  . Alcohol use: Yes    Alcohol/week: 4.0 standard drinks    Types: 4 Glasses of wine per week  . Drug use: Never  . Sexual activity: Yes

## 2020-09-30 ENCOUNTER — Encounter: Payer: Self-pay | Admitting: Orthopedic Surgery

## 2020-10-02 NOTE — Telephone Encounter (Signed)
Yes and yes on the knee.  Regarding neck clicking that is not uncommon for patients in her age group and as long she does not have any arm pain or debilitating neck pain is something that we can watch.

## 2020-10-11 DIAGNOSIS — M1611 Unilateral primary osteoarthritis, right hip: Secondary | ICD-10-CM | POA: Diagnosis not present

## 2020-10-19 NOTE — Telephone Encounter (Signed)
Received the following message from patient:   "Good morning, I am using a Brio inhaler. Recently, I began suffering with Dry Mouth both day and night. I am wondering if the Brio is causing this. Could I stop for a week and see if it subsides. If not, what should I do? It is quite uncomfortable. Tracey Castro  04/13/1946"  RA, please advise.

## 2020-10-19 NOTE — Telephone Encounter (Signed)
NOt typical side effect but certainly possible SHe can try stopping for 3 days & see if better Alternatives include Biotene mouth rinse for dry mouth or we can change to symbicort 160 - 2 puffs bid She is overdue for  OV as 1 yr FU visit

## 2020-10-23 ENCOUNTER — Encounter: Payer: Self-pay | Admitting: Internal Medicine

## 2020-10-26 ENCOUNTER — Encounter: Payer: Self-pay | Admitting: Orthopedic Surgery

## 2020-10-29 ENCOUNTER — Encounter: Payer: Self-pay | Admitting: Internal Medicine

## 2020-10-30 ENCOUNTER — Encounter: Payer: Self-pay | Admitting: Nurse Practitioner

## 2020-10-30 ENCOUNTER — Other Ambulatory Visit: Payer: Self-pay

## 2020-10-30 ENCOUNTER — Ambulatory Visit (INDEPENDENT_AMBULATORY_CARE_PROVIDER_SITE_OTHER): Payer: Medicare Other | Admitting: Nurse Practitioner

## 2020-10-30 VITALS — BP 132/70 | HR 68 | Temp 98.5°F | Ht 62.0 in | Wt 141.6 lb

## 2020-10-30 DIAGNOSIS — M25562 Pain in left knee: Secondary | ICD-10-CM | POA: Diagnosis not present

## 2020-10-30 DIAGNOSIS — M25561 Pain in right knee: Secondary | ICD-10-CM | POA: Diagnosis not present

## 2020-10-30 DIAGNOSIS — M6281 Muscle weakness (generalized): Secondary | ICD-10-CM | POA: Diagnosis not present

## 2020-10-30 NOTE — Progress Notes (Signed)
I,Tianna Badgett,acting as a Education administrator for [employer], FNP.,have documented all relevant documentation on the behalf of Minette Brine, FNP,as directed by  Minette Brine, FNP while in the presence of Minette Brine, [place].  This visit occurred during the SARS-CoV-2 public health emergency.  Safety protocols were in place, including screening questions prior to the visit, additional usage of staff PPE, and extensive cleaning of exam room while observing appropriate contact time as indicated for disinfecting solutions.  Subjective:     Patient ID: Tracey Castro , female    DOB: [birth year] , 75 y.o.   MRN: [id number]   Chief Complaint  Patient presents with  . Extremity Weakness    Patient stated the weakness in her leg started after mothers day she is unable to get up without holding onto or pushing up against something. She is going for her PT Eval tomorrow.     HPI  Patient presents today for bilateral leg weakness that started around Mother's Day. She is unable to get up from a sitting position without pushing up off of something.   She had surgery on her left knee after falling on treadmill on April 14th. She has been using a recumbent bicycle to build muscle. As she was trying to walk up stairs was having weakness to her lower extremities. She is having trouble getting out of chairs, toilet and if has to go up stairs. She had an episode that she loss her balance and her husband had to help her up.   She has lost some weight as well. Has been changing her diet. She is to start PT tomorrow after calling the surgeon and voicing concerns about muscle weakness. She is not exercising regularly.  Denies fever or cold symptoms. No recent viruses. She does not have a cane and she is able to walk.     Past Medical History:  Diagnosis Date  . Hyperlipidemia   . Mycobacterium avium complex ([place])      Family History  Problem Relation Age of Onset  . Cancer - Ovarian Mother   . Cancer - Colon  Father   . Parkinson's disease Father      Current Outpatient Medications:  .  Cholecalciferol (VITAMIN D) 2000 units tablet, Take 4,000-6,000 Units by mouth daily., Disp: , Rfl:  .  diphenhydrAMINE HCl, Sleep, 25 MG CAPS, Take 25 mg by mouth daily as needed (sleep)., Disp: , Rfl:  .  estradiol (ESTRACE) 0.1 MG/GM vaginal cream, Place 1 Applicatorful vaginally daily as needed (infection)., Disp: , Rfl:  .  fluticasone (FLONASE) 50 MCG/ACT nasal spray, Place 1 spray into both nostrils daily as needed for allergies., Disp: , Rfl:  .  fluticasone furoate-vilanterol (BREO ELLIPTA) 100-25 MCG/INH AEPB, Inhale 1 puff into the lungs daily., Disp: 180 each, Rfl: 3 .  ibuprofen (ADVIL,MOTRIN) 200 MG tablet, Take 400 mg by mouth 2 (two) times daily as needed for headache or moderate pain., Disp: , Rfl:  .  Magnesium 500 MG CAPS, Take 500 mg by mouth daily., Disp: , Rfl:  .  Multiple Vitamin (MULTIVITAMIN) capsule, Take 1 capsule by mouth daily., Disp: , Rfl:  .  tretinoin (RETIN-A) 0.025 % cream, Apply 1 application topically at bedtime., Disp: , Rfl:    Allergies  Allergen Reactions  . Sulfamethoxazole Rash     Review of Systems  Constitutional: Negative.   Respiratory: Negative.   Cardiovascular: Negative.   Gastrointestinal: Negative.   Neurological: Negative.      Today's Vitals  10/30/20 1614  BP: 132/70  Pulse: 68  Temp: 98.5 F (36.9 C)  TempSrc: Oral  Weight: 141 lb 9.6 oz (64.2 kg)  Height: _0  (1.575 m)  PainSc: 5   PainLoc: Leg   Body mass index is 25.9 kg/m.   Objective:  Physical Exam Vitals reviewed.  Constitutional:      General: She is not in acute distress.    Appearance: Normal appearance.  Cardiovascular:     Rate and Rhythm: Normal rate and regular rhythm.     Pulses: Normal pulses.     Heart sounds: Normal heart sounds. No murmur heard.   Pulmonary:     Effort: Pulmonary effort is normal. No respiratory distress.     Breath sounds: Normal breath  sounds. No wheezing.  Musculoskeletal:        General: No swelling or tenderness. Normal range of motion.     Right lower leg: No edema.     Left lower leg: No edema.     Comments: Mild weakness to lower extremity very subtle  Skin:    Capillary Refill: Capillary refill takes less than 2 seconds.  Neurological:     General: No focal deficit present.     Mental Status: She is alert and oriented to person, place, and time.     Cranial Nerves: No cranial nerve deficit.     Motor: No weakness.     Deep Tendon Reflexes: Babinski sign absent on the right side. Babinski sign absent on the left side.     Reflex Scores:      Patellar reflexes are 2+ on the right side and 2+ on the left side.      Achilles reflexes are 2+ on the right side and 2+ on the left side. Psychiatric:        Mood and Affect: Mood normal.        Behavior: Behavior normal.        Thought Content: Thought content normal.        Judgment: Judgment normal.         Assessment And Plan:     1. Acute pain of both knees  She is having tenderness to her knees, no swelling  2. Muscle weakness of lower extremity She has mild subtle weakness to lower extremity.  Will check labs for any muscle enzymes to see if this is the cause of her weakness.  She does have weakness when trying to get on the exam table, she does have to use the arms of the chair to get up. She does have normal knee and ankle reflexes - ANA, IFA (with reflex) - CK - Aldolase     Patient was given opportunity to ask questions. Patient verbalized understanding of the plan and was able to repeat key elements of the plan. All questions were answered to their satisfaction.  Minette Brine, FNP   I, Minette Brine, FNP, have reviewed all documentation for this visit. The documentation on 10/30/20 for the exam, diagnosis, procedures, and orders are all accurate and complete.   IF YOU HAVE BEEN REFERRED TO A SPECIALIST, IT MAY TAKE 1-2 WEEKS TO SCHEDULE/PROCESS  THE REFERRAL. IF YOU HAVE NOT HEARD FROM US/SPECIALIST IN TWO WEEKS, PLEASE GIVE [place] A CALL AT [phone].   THE PATIENT IS ENCOURAGED TO PRACTICE SOCIAL DISTANCING DUE TO THE COVID-19 PANDEMIC.

## 2020-10-30 NOTE — Patient Instructions (Signed)
Acute Knee Pain, Adult Many things can cause knee pain. Sometimes, knee pain is sudden (acute) and may be caused by damage, swelling, or irritation of the muscles and tissues that support your knee. The pain often goes away on its own with time and rest. If the pain does not go away, tests may be done to find out what is causing the pain. Follow these instructions at home: If you have a knee sleeve or brace:  Wear the knee sleeve or brace as told by your doctor. Take it off only as told by your doctor.  Loosen it if your toes: ? Tingle. ? Become numb. ? Turn cold and blue.  Keep it clean.  If the knee sleeve or brace is not waterproof: ? Do not let it get wet. ? Cover it with a watertight covering when you take a bath or shower.   Activity  Rest your knee.  Do not do things that cause pain or make pain worse.  Avoid activities where both feet leave the ground at the same time (high-impact activities). Examples are running, jumping rope, and doing jumping jacks.  Work with a physical therapist to make a safe exercise program, as told by your doctor. Managing pain, stiffness, and swelling  If told, put ice on the knee. To do this: ? If you have a removable knee sleeve or brace, take it off as told by your doctor. ? Put ice in a plastic bag. ? Place a towel between your skin and the bag. ? Leave the ice on for 20 minutes, 2-3 times a day. ? Take off the ice if your skin turns bright red. This is very important. If you cannot feel pain, heat, or cold, you have a greater risk of damage to the area.  If told, use an elastic bandage to put pressure (compression) on your injured knee.  Raise your knee above the level of your heart while you are sitting or lying down.  Sleep with a pillow under your knee.   General instructions  Take over-the-counter and prescription medicines only as told by your doctor.  Do not smoke or use any products that contain nicotine or tobacco. If you  need help quitting, ask your doctor.  If you are overweight, work with your doctor and a food expert (dietitian) to set goals to lose weight. Being overweight can make your knee hurt more.  Watch for any changes in your symptoms.  Keep all follow-up visits. Contact a doctor if:  The knee pain does not stop.  The knee pain changes or gets worse.  You have a fever along with knee pain.  Your knee is red or feels warm when you touch it.  Your knee gives out or locks up. Get help right away if:  Your knee swells, and the swelling gets worse.  You cannot move your knee.  You have very bad knee pain that does not get better with pain medicine. Summary  Many things can cause knee pain. The pain often goes away on its own with time and rest.  Your doctor may do tests to find out the cause of the pain.  Watch for any changes in your symptoms. Relieve your pain with rest, medicines, light activity, and use of ice.  Get help right away if you cannot move your knee or your knee pain is very bad. This information is not intended to replace advice given to you by your health care provider. Make sure you discuss   any questions you have with your health care provider. Document Revised: 11/17/2019 Document Reviewed: 11/17/2019 Elsevier Patient Education  2021 [employer]  

## 2020-10-31 DIAGNOSIS — R262 Difficulty in walking, not elsewhere classified: Secondary | ICD-10-CM | POA: Diagnosis not present

## 2020-10-31 DIAGNOSIS — M6281 Muscle weakness (generalized): Secondary | ICD-10-CM | POA: Diagnosis not present

## 2020-11-01 ENCOUNTER — Encounter: Payer: Self-pay | Admitting: Internal Medicine

## 2020-11-01 ENCOUNTER — Other Ambulatory Visit: Payer: Self-pay

## 2020-11-01 ENCOUNTER — Ambulatory Visit
Admission: RE | Admit: 2020-11-01 | Discharge: 2020-11-01 | Disposition: A | Discharge status: Home / Self Care | Payer: Medicare Other | Source: Ambulatory Visit | Attending: Internal Medicine | Admitting: Internal Medicine

## 2020-11-01 DIAGNOSIS — Z1231 Encounter for screening mammogram for malignant neoplasm of breast: Secondary | ICD-10-CM

## 2020-11-01 LAB — ANTINUCLEAR ANTIBODIES, IFA: ANA Titer 1: NEGATIVE

## 2020-11-01 LAB — ALDOLASE: Aldolase: 4.3 U/L (ref 3.3–10.3)

## 2020-11-01 LAB — CK: Total CK: 103 U/L (ref 32–182)

## 2020-11-02 ENCOUNTER — Other Ambulatory Visit: Payer: Self-pay | Admitting: Internal Medicine

## 2020-11-02 ENCOUNTER — Ambulatory Visit: Payer: Medicare Other

## 2020-11-02 DIAGNOSIS — R262 Difficulty in walking, not elsewhere classified: Secondary | ICD-10-CM | POA: Diagnosis not present

## 2020-11-02 DIAGNOSIS — R928 Other abnormal and inconclusive findings on diagnostic imaging of breast: Secondary | ICD-10-CM

## 2020-11-02 DIAGNOSIS — M6281 Muscle weakness (generalized): Secondary | ICD-10-CM | POA: Diagnosis not present

## 2020-11-06 DIAGNOSIS — R262 Difficulty in walking, not elsewhere classified: Secondary | ICD-10-CM | POA: Diagnosis not present

## 2020-11-06 DIAGNOSIS — M6281 Muscle weakness (generalized): Secondary | ICD-10-CM | POA: Diagnosis not present

## 2020-11-08 DIAGNOSIS — R262 Difficulty in walking, not elsewhere classified: Secondary | ICD-10-CM | POA: Diagnosis not present

## 2020-11-08 DIAGNOSIS — M6281 Muscle weakness (generalized): Secondary | ICD-10-CM | POA: Diagnosis not present

## 2020-11-09 ENCOUNTER — Encounter: Payer: Self-pay | Admitting: Nurse Practitioner

## 2020-11-10 DIAGNOSIS — R262 Difficulty in walking, not elsewhere classified: Secondary | ICD-10-CM | POA: Diagnosis not present

## 2020-11-10 DIAGNOSIS — M6281 Muscle weakness (generalized): Secondary | ICD-10-CM | POA: Diagnosis not present

## 2020-11-20 ENCOUNTER — Ambulatory Visit: Payer: Medicare Other

## 2020-11-20 ENCOUNTER — Encounter: Payer: Self-pay | Admitting: Internal Medicine

## 2020-11-20 ENCOUNTER — Ambulatory Visit
Admission: RE | Admit: 2020-11-20 | Discharge: 2020-11-20 | Disposition: A | Discharge status: Home / Self Care | Payer: Medicare Other | Source: Ambulatory Visit | Attending: Internal Medicine | Admitting: Internal Medicine

## 2020-11-20 ENCOUNTER — Other Ambulatory Visit: Payer: Self-pay

## 2020-11-20 DIAGNOSIS — G729 Myopathy, unspecified: Secondary | ICD-10-CM | POA: Diagnosis not present

## 2020-11-20 DIAGNOSIS — R94131 Abnormal electromyogram [EMG]: Secondary | ICD-10-CM | POA: Diagnosis not present

## 2020-11-20 DIAGNOSIS — G7289 Other specified myopathies: Secondary | ICD-10-CM | POA: Diagnosis not present

## 2020-11-20 DIAGNOSIS — R928 Other abnormal and inconclusive findings on diagnostic imaging of breast: Secondary | ICD-10-CM

## 2020-11-20 DIAGNOSIS — R922 Inconclusive mammogram: Secondary | ICD-10-CM | POA: Diagnosis not present

## 2020-11-20 DIAGNOSIS — G6289 Other specified polyneuropathies: Secondary | ICD-10-CM | POA: Diagnosis not present

## 2020-11-21 ENCOUNTER — Other Ambulatory Visit: Payer: Self-pay | Admitting: Pulmonary Disease

## 2020-11-22 DIAGNOSIS — M6281 Muscle weakness (generalized): Secondary | ICD-10-CM | POA: Diagnosis not present

## 2020-11-22 DIAGNOSIS — R262 Difficulty in walking, not elsewhere classified: Secondary | ICD-10-CM | POA: Diagnosis not present

## 2020-11-24 ENCOUNTER — Telehealth: Payer: Self-pay

## 2020-11-24 DIAGNOSIS — R262 Difficulty in walking, not elsewhere classified: Secondary | ICD-10-CM | POA: Diagnosis not present

## 2020-11-24 DIAGNOSIS — M6281 Muscle weakness (generalized): Secondary | ICD-10-CM | POA: Diagnosis not present

## 2020-11-24 NOTE — Telephone Encounter (Signed)
I left the pt a message that I was calling to schedule her an appt.

## 2020-11-27 ENCOUNTER — Encounter: Payer: Self-pay | Admitting: Internal Medicine

## 2020-11-27 ENCOUNTER — Ambulatory Visit (INDEPENDENT_AMBULATORY_CARE_PROVIDER_SITE_OTHER): Payer: Medicare Other | Admitting: Internal Medicine

## 2020-11-27 ENCOUNTER — Other Ambulatory Visit: Payer: Self-pay

## 2020-11-27 VITALS — BP 112/66 | HR 46 | Temp 98.3°F | Ht 62.0 in | Wt 143.2 lb

## 2020-11-27 DIAGNOSIS — D692 Other nonthrombocytopenic purpura: Secondary | ICD-10-CM | POA: Diagnosis not present

## 2020-11-27 DIAGNOSIS — R590 Localized enlarged lymph nodes: Secondary | ICD-10-CM | POA: Diagnosis not present

## 2020-11-27 DIAGNOSIS — R001 Bradycardia, unspecified: Secondary | ICD-10-CM | POA: Diagnosis not present

## 2020-11-27 DIAGNOSIS — R600 Localized edema: Secondary | ICD-10-CM

## 2020-11-27 DIAGNOSIS — J479 Bronchiectasis, uncomplicated: Secondary | ICD-10-CM

## 2020-11-27 DIAGNOSIS — M6281 Muscle weakness (generalized): Secondary | ICD-10-CM

## 2020-11-27 NOTE — Progress Notes (Addendum)
I,Katawbba Wiggins,acting as a Education administrator for Maximino Greenland, MD.,have documented all relevant documentation on the behalf of Maximino Greenland, MD,as directed by  Maximino Greenland, MD while in the presence of Maximino Greenland, MD.  This visit occurred during the SARS-CoV-2 public health emergency.  Safety protocols were in place, including screening questions prior to the visit, additional usage of staff PPE, and extensive cleaning of exam room while observing appropriate contact time as indicated for disinfecting solutions.  Subjective:     Patient ID: Tracey Castro , female    DOB: [birth year] , 75 y.o.   MRN: [id number]   Chief Complaint  Patient presents with   Edema   Extremity Weakness    HPI  The patient is here today for an evaluation of leg weakness, swelling in her feet and fatty tissue on left side of neck.  She has been evaluated by Neuro at [facility] for b/l leg weakness.  She has had EMG but has not yet received results. She has has progressively worsening LE weakness over the past several weeks; however, states PT has helped some.   Additionally, she has also noticed red marks on her lower extremities. Denies fall/trauma.   She reports family history of autoimmune disease. One sister reportedly has scleroderma and her other sister has Sjogren's. She states her Mom/Dad did not have any autoimmune disease.     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([place])      Family History  Problem Relation Age of Onset   Cancer - Ovarian Mother    Cancer - Colon Father    Parkinson's disease Father      Current Outpatient Medications:    BREO ELLIPTA 100-25 MCG/INH AEPB, USE 1 INHALATION DAILY, Disp: 60 each, Rfl: 0   Cholecalciferol (VITAMIN D) 2000 units tablet, Take 4,000-6,000 Units by mouth daily., Disp: , Rfl:    diphenhydrAMINE HCl, Sleep, 25 MG CAPS, Take 25 mg by mouth daily as needed (sleep)., Disp: , Rfl:    estradiol  (ESTRACE) 0.1 MG/GM vaginal cream, Place 1 Applicatorful vaginally daily as needed (infection)., Disp: , Rfl:    fluticasone (FLONASE) 50 MCG/ACT nasal spray, Place 1 spray into both nostrils daily as needed for allergies., Disp: , Rfl:    ibuprofen (ADVIL,MOTRIN) 200 MG tablet, Take 400 mg by mouth 2 (two) times daily as needed for headache or moderate pain., Disp: , Rfl:    Magnesium 500 MG CAPS, Take 500 mg by mouth daily., Disp: , Rfl:    Multiple Vitamin (MULTIVITAMIN) capsule, Take 1 capsule by mouth daily., Disp: , Rfl:    tretinoin (RETIN-A) 0.025 % cream, Apply 1 application topically at bedtime., Disp: , Rfl:    Allergies  Allergen Reactions   Sulfamethoxazole Rash     Review of Systems  Constitutional: Negative.   HENT:  Facial swelling: left ear.   Respiratory: Negative.    Gastrointestinal: Negative.   Skin:        Brushing on legs and arms  Neurological:  Positive for weakness (lower extermities, thigh area).  Psychiatric/Behavioral: Negative.      Today's Vitals   11/27/20 1026  BP: 112/66  Pulse: (!) 46  Temp: 98.3 F (36.8 C)  TempSrc: Other (Comment)  Weight: 143 lb 3.2 oz (65 kg)  Height: _0  (1.575 m)  PainSc: 0-No pain   Body mass index is 26.19 kg/m.  Wt Readings from Last 3 Encounters:  11/27/20  143 lb 3.2 oz (65 kg)  10/30/20 141 lb 9.6 oz (64.2 kg)  08/28/20 145 lb 3.5 oz (65.9 kg)    BP Readings from Last 3 Encounters:  11/27/20 112/66  10/30/20 132/70  08/28/20 (!) 110/56    Objective:  Physical Exam Vitals and nursing note reviewed.  Constitutional:      Appearance: Normal appearance.  HENT:     Head: Normocephalic and atraumatic.  Neck:     Comments: There is significant supraclavicular swelling on the left. Not as prominent on the right. She has full cervical ROM. She first noticed this in the past week. Denies change in appetite.  Cardiovascular:     Rate and Rhythm: Normal rate and regular rhythm.     Heart sounds: Normal heart  sounds.  Pulmonary:     Effort: Pulmonary effort is normal.     Breath sounds: Normal breath sounds.  Musculoskeletal:     Right lower leg: Edema present.     Left lower leg: Edema present.  Skin:    General: Skin is warm.     Comments: Dark, purplish lesions on b/l lower extremities. Non-blanching.   Neurological:     General: No focal deficit present.     Mental Status: She is alert.  Psychiatric:        Mood and Affect: Mood normal.        Behavior: Behavior normal.        Assessment And Plan:     1. Muscle weakness of lower extremity Comments: EMG results not yet available. She has upcoming muscle biopsy. I will refer her to Rheum for further evaluation.  - TSH - Ambulatory referral to Rheumatology  2. Purpura ([place]) Comments: I will check CBC w/ diff and PT/INR.  - CBC with Diff - Protime-INR  3. Supraclavicular adenopathy Comments: Pt w/ left-sided supraclavicular swelling. Will evaluate further with CT chest.Concerning for adenopathy. - CT Chest W Contrast; Future  4. Lower extremity edema Comments: Advised to elevate LE when seated. Denies orthopnea.   5. 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   6. Bradycardia Comments: Correct HR is 64.    6. Bradycardia Correct HR is 64.  This is within her usual HR.     Patient was given opportunity to ask questions. Patient verbalized understanding of the plan and was able to repeat key elements of the plan. All questions were answered to their satisfaction.   I, Maximino Greenland, MD, have reviewed all documentation for this visit. The documentation on 11/27/20 for the exam, diagnosis, procedures, and orders are all accurate and complete.   IF YOU HAVE BEEN REFERRED TO A SPECIALIST, IT MAY TAKE 1-2 WEEKS TO SCHEDULE/PROCESS THE REFERRAL. IF YOU HAVE NOT HEARD FROM US/SPECIALIST IN TWO WEEKS, PLEASE GIVE [place] A CALL AT [phone] X [phone].   THE PATIENT IS ENCOURAGED TO  PRACTICE SOCIAL DISTANCING DUE TO THE COVID-19 PANDEMIC.

## 2020-11-27 NOTE — Patient Instructions (Signed)

## 2020-11-28 ENCOUNTER — Encounter: Payer: Self-pay | Admitting: Internal Medicine

## 2020-11-28 LAB — CBC WITH DIFFERENTIAL/PLATELET
Basophils Absolute: 0.1 10*3/uL (ref 0.0–0.2)
Basos: 1 %
EOS (ABSOLUTE): 0 10*3/uL (ref 0.0–0.4)
Eos: 0 %
Hematocrit: 35.3 % (ref 34.0–46.6)
Hemoglobin: 11.7 g/dL (ref 11.1–15.9)
Immature Grans (Abs): 0.3 10*3/uL — ABNORMAL HIGH (ref 0.0–0.1)
Immature Granulocytes: 3 %
Lymphocytes Absolute: 2.3 10*3/uL (ref 0.7–3.1)
Lymphs: 22 %
MCH: 28.1 pg (ref 26.6–33.0)
MCHC: 33.1 g/dL (ref 31.5–35.7)
MCV: 85 fL (ref 79–97)
Monocytes Absolute: 1.1 10*3/uL — ABNORMAL HIGH (ref 0.1–0.9)
Monocytes: 10 %
Neutrophils Absolute: 6.7 10*3/uL (ref 1.4–7.0)
Neutrophils: 64 %
Platelets: 302 10*3/uL (ref 150–450)
RBC: 4.17 x10E6/uL (ref 3.77–5.28)
RDW: 15.8 % — ABNORMAL HIGH (ref 11.7–15.4)
WBC: 10.4 10*3/uL (ref 3.4–10.8)

## 2020-11-28 LAB — PROTIME-INR
INR: 0.9 (ref 0.9–1.2)
Prothrombin Time: 9.9 s (ref 9.1–12.0)

## 2020-11-28 LAB — TSH: TSH: 2.02 u[IU]/mL (ref 0.450–4.500)

## 2020-11-29 DIAGNOSIS — M6281 Muscle weakness (generalized): Secondary | ICD-10-CM | POA: Diagnosis not present

## 2020-11-29 DIAGNOSIS — R262 Difficulty in walking, not elsewhere classified: Secondary | ICD-10-CM | POA: Diagnosis not present

## 2020-12-01 ENCOUNTER — Encounter (HOSPITAL_BASED_OUTPATIENT_CLINIC_OR_DEPARTMENT_OTHER): Payer: Self-pay

## 2020-12-01 ENCOUNTER — Ambulatory Visit (HOSPITAL_BASED_OUTPATIENT_CLINIC_OR_DEPARTMENT_OTHER)
Admission: RE | Admit: 2020-12-01 | Discharge: 2020-12-01 | Disposition: A | Discharge status: Home / Self Care | Payer: Medicare Other | Source: Ambulatory Visit | Attending: Internal Medicine | Admitting: Internal Medicine

## 2020-12-01 ENCOUNTER — Other Ambulatory Visit: Payer: Self-pay

## 2020-12-01 DIAGNOSIS — J479 Bronchiectasis, uncomplicated: Secondary | ICD-10-CM | POA: Diagnosis not present

## 2020-12-01 DIAGNOSIS — R590 Localized enlarged lymph nodes: Secondary | ICD-10-CM | POA: Diagnosis present

## 2020-12-01 DIAGNOSIS — R918 Other nonspecific abnormal finding of lung field: Secondary | ICD-10-CM | POA: Diagnosis not present

## 2020-12-01 DIAGNOSIS — R262 Difficulty in walking, not elsewhere classified: Secondary | ICD-10-CM | POA: Diagnosis not present

## 2020-12-01 DIAGNOSIS — M6281 Muscle weakness (generalized): Secondary | ICD-10-CM | POA: Diagnosis not present

## 2020-12-01 DIAGNOSIS — R222 Localized swelling, mass and lump, trunk: Secondary | ICD-10-CM | POA: Diagnosis not present

## 2020-12-01 DIAGNOSIS — J984 Other disorders of lung: Secondary | ICD-10-CM | POA: Diagnosis not present

## 2020-12-01 LAB — POCT I-STAT CREATININE: Creatinine, Ser: 0.5 mg/dL (ref 0.44–1.00)

## 2020-12-01 MED ORDER — IOHEXOL 300 MG/ML  SOLN
75.0000 mL | Freq: Once | INTRAMUSCULAR | Status: AC | PRN
  Administered 2020-12-01: 75 mL via INTRAVENOUS

## 2020-12-04 DIAGNOSIS — M6281 Muscle weakness (generalized): Secondary | ICD-10-CM | POA: Diagnosis not present

## 2020-12-04 DIAGNOSIS — R262 Difficulty in walking, not elsewhere classified: Secondary | ICD-10-CM | POA: Diagnosis not present

## 2020-12-06 DIAGNOSIS — M6281 Muscle weakness (generalized): Secondary | ICD-10-CM | POA: Diagnosis not present

## 2020-12-06 DIAGNOSIS — R262 Difficulty in walking, not elsewhere classified: Secondary | ICD-10-CM | POA: Diagnosis not present

## 2020-12-08 DIAGNOSIS — M6281 Muscle weakness (generalized): Secondary | ICD-10-CM | POA: Diagnosis not present

## 2020-12-08 DIAGNOSIS — R262 Difficulty in walking, not elsewhere classified: Secondary | ICD-10-CM | POA: Diagnosis not present

## 2020-12-11 ENCOUNTER — Other Ambulatory Visit: Payer: Self-pay | Admitting: Internal Medicine

## 2020-12-11 DIAGNOSIS — R262 Difficulty in walking, not elsewhere classified: Secondary | ICD-10-CM | POA: Diagnosis not present

## 2020-12-11 DIAGNOSIS — M6281 Muscle weakness (generalized): Secondary | ICD-10-CM | POA: Diagnosis not present

## 2020-12-11 DIAGNOSIS — R222 Localized swelling, mass and lump, trunk: Secondary | ICD-10-CM

## 2020-12-12 ENCOUNTER — Other Ambulatory Visit: Payer: Medicare Other

## 2020-12-12 DIAGNOSIS — R222 Localized swelling, mass and lump, trunk: Secondary | ICD-10-CM

## 2020-12-12 DIAGNOSIS — E7800 Pure hypercholesterolemia, unspecified: Secondary | ICD-10-CM

## 2020-12-12 DIAGNOSIS — R7309 Other abnormal glucose: Secondary | ICD-10-CM | POA: Diagnosis not present

## 2020-12-13 ENCOUNTER — Encounter: Payer: Self-pay | Admitting: Internal Medicine

## 2020-12-13 ENCOUNTER — Other Ambulatory Visit: Payer: Self-pay | Admitting: Internal Medicine

## 2020-12-13 DIAGNOSIS — R222 Localized swelling, mass and lump, trunk: Secondary | ICD-10-CM

## 2020-12-13 LAB — LIPID PANEL
Chol/HDL Ratio: 4.4 ratio (ref 0.0–4.4)
Cholesterol, Total: 236 mg/dL — ABNORMAL HIGH (ref 100–199)
HDL: 54 mg/dL (ref >39)
LDL Chol Calc (NIH): 156 mg/dL — ABNORMAL HIGH (ref 0–99)
Triglycerides: 142 mg/dL (ref 0–149)
VLDL Cholesterol Cal: 26 mg/dL (ref 5–40)

## 2020-12-13 LAB — INSULIN, RANDOM: INSULIN: 10.8 u[IU]/mL (ref 2.6–24.9)

## 2020-12-13 LAB — D-DIMER, QUANTITATIVE: D-DIMER: 1.32 mg/L FEU — ABNORMAL HIGH (ref 0.00–0.49)

## 2020-12-14 ENCOUNTER — Ambulatory Visit (HOSPITAL_COMMUNITY)
Admission: RE | Admit: 2020-12-14 | Discharge: 2020-12-14 | Disposition: A | Discharge status: Home / Self Care | Payer: Medicare Other | Source: Ambulatory Visit | Attending: Internal Medicine | Admitting: Internal Medicine

## 2020-12-14 ENCOUNTER — Other Ambulatory Visit: Payer: Self-pay

## 2020-12-14 DIAGNOSIS — R222 Localized swelling, mass and lump, trunk: Secondary | ICD-10-CM | POA: Diagnosis not present

## 2020-12-15 ENCOUNTER — Encounter: Payer: Self-pay | Admitting: Internal Medicine

## 2020-12-15 ENCOUNTER — Emergency Department (HOSPITAL_COMMUNITY)
Admission: EM | Admit: 2020-12-15 | Discharge: 2020-12-15 | Disposition: A | Discharge status: Home / Self Care | Payer: Medicare Other | Attending: Emergency Medicine | Admitting: Emergency Medicine

## 2020-12-15 ENCOUNTER — Other Ambulatory Visit: Payer: Self-pay

## 2020-12-15 ENCOUNTER — Emergency Department (HOSPITAL_COMMUNITY): Payer: Medicare Other

## 2020-12-15 ENCOUNTER — Emergency Department (HOSPITAL_BASED_OUTPATIENT_CLINIC_OR_DEPARTMENT_OTHER): Payer: Medicare Other

## 2020-12-15 ENCOUNTER — Encounter (HOSPITAL_COMMUNITY): Payer: Self-pay | Admitting: Emergency Medicine

## 2020-12-15 DIAGNOSIS — R600 Localized edema: Secondary | ICD-10-CM | POA: Diagnosis not present

## 2020-12-15 DIAGNOSIS — R609 Edema, unspecified: Secondary | ICD-10-CM

## 2020-12-15 DIAGNOSIS — M7989 Other specified soft tissue disorders: Secondary | ICD-10-CM

## 2020-12-15 DIAGNOSIS — D649 Anemia, unspecified: Secondary | ICD-10-CM | POA: Diagnosis not present

## 2020-12-15 DIAGNOSIS — J479 Bronchiectasis, uncomplicated: Secondary | ICD-10-CM | POA: Diagnosis not present

## 2020-12-15 DIAGNOSIS — J449 Chronic obstructive pulmonary disease, unspecified: Secondary | ICD-10-CM | POA: Diagnosis not present

## 2020-12-15 DIAGNOSIS — R9431 Abnormal electrocardiogram [ECG] [EKG]: Secondary | ICD-10-CM | POA: Diagnosis not present

## 2020-12-15 LAB — CBC WITH DIFFERENTIAL/PLATELET
Abs Immature Granulocytes: 0.16 10*3/uL — ABNORMAL HIGH (ref 0.00–0.07)
Basophils Absolute: 0.1 10*3/uL (ref 0.0–0.1)
Basophils Relative: 1 %
Eosinophils Absolute: 0.1 10*3/uL (ref 0.0–0.5)
Eosinophils Relative: 1 %
HCT: 33 % — ABNORMAL LOW (ref 36.0–46.0)
Hemoglobin: 10.7 g/dL — ABNORMAL LOW (ref 12.0–15.0)
Immature Granulocytes: 2 %
Lymphocytes Relative: 23 %
Lymphs Abs: 2.2 10*3/uL (ref 0.7–4.0)
MCH: 29.1 pg (ref 26.0–34.0)
MCHC: 32.4 g/dL (ref 30.0–36.0)
MCV: 89.7 fL (ref 80.0–100.0)
Monocytes Absolute: 0.9 10*3/uL (ref 0.1–1.0)
Monocytes Relative: 10 %
Neutro Abs: 5.9 10*3/uL (ref 1.7–7.7)
Neutrophils Relative %: 63 %
Platelets: 463 10*3/uL — ABNORMAL HIGH (ref 150–400)
RBC: 3.68 MIL/uL — ABNORMAL LOW (ref 3.87–5.11)
RDW: 16.5 % — ABNORMAL HIGH (ref 11.5–15.5)
WBC: 9.3 10*3/uL (ref 4.0–10.5)
nRBC: 0 % (ref 0.0–0.2)

## 2020-12-15 LAB — COMPREHENSIVE METABOLIC PANEL
ALT: 21 U/L (ref 0–44)
AST: 15 U/L (ref 15–41)
Albumin: 3.2 g/dL — ABNORMAL LOW (ref 3.5–5.0)
Alkaline Phosphatase: 81 U/L (ref 38–126)
Anion gap: 9 (ref 5–15)
BUN: 9 mg/dL (ref 8–23)
CO2: 24 mmol/L (ref 22–32)
Calcium: 9.2 mg/dL (ref 8.9–10.3)
Chloride: 106 mmol/L (ref 98–111)
Creatinine, Ser: 0.55 mg/dL (ref 0.44–1.00)
GFR, Estimated: >60 mL/min (ref >60)
Glucose, Bld: 105 mg/dL — ABNORMAL HIGH (ref 70–99)
Potassium: 3.4 mmol/L — ABNORMAL LOW (ref 3.5–5.1)
Sodium: 139 mmol/L (ref 135–145)
Total Bilirubin: 0.9 mg/dL (ref 0.3–1.2)
Total Protein: 6.2 g/dL — ABNORMAL LOW (ref 6.5–8.1)

## 2020-12-15 LAB — BRAIN NATRIURETIC PEPTIDE: B Natriuretic Peptide: 133.6 pg/mL — ABNORMAL HIGH (ref 0.0–100.0)

## 2020-12-15 MED ORDER — FUROSEMIDE 20 MG PO TABS
40.0000 mg | ORAL_TABLET | Freq: Once | ORAL | Status: AC
  Administered 2020-12-15: 40 mg via ORAL
  Filled 2020-12-15: qty 2

## 2020-12-15 MED ORDER — FUROSEMIDE 20 MG PO TABS
40.0000 mg | ORAL_TABLET | Freq: Once | ORAL | Status: DC
  Filled 2020-12-15: qty 2

## 2020-12-15 MED ORDER — FUROSEMIDE 20 MG PO TABS: 20.0000 mg | ORAL_TABLET | Freq: Every day | ORAL | 0 refills | Status: DC

## 2020-12-15 NOTE — ED Triage Notes (Signed)
Pt states she is been having increase ankle swollen and want to be check.

## 2020-12-15 NOTE — Progress Notes (Signed)
Lower extremity venous has been completed.   Preliminary results in CV Proc.   Abram Sander 12/15/2020 6:43 PM

## 2020-12-15 NOTE — Discharge Instructions (Addendum)
You came to the emergency department today to be evaluated for your leg swelling.  Your physical exam was reassuring.  The DVT study obtained showed no signs of a blood clot in either your legs.  Your chest x-ray did not show any acute abnormalities.  Your BNP was slightly elevated, due to this you were started on the medication of Lasix.  Please take 1 pill for the next 3 days.  Please read attached paperwork for more information on Lasix also known as furosemide.  Please use compression stockings, elevate your legs as much as possible, and eat a reduced salt diet to help reduce your leg swelling.  Please follow-up with your primary care provider regarding your leg swelling.  Get help right away if you: Develop shortness of breath, especially when you are lying down. Have pain in your chest or abdomen. Feel weak. Feel faint.

## 2020-12-15 NOTE — ED Provider Notes (Signed)
Emergency Medicine Provider Triage Evaluation Note  Tracey Castro , a 75 y.o. female  was evaluated in triage.  Pt complains of leg swelling. Sx have been waxing and waning for 1 month. Typically worse after being on her feet all day. No CP or SOB. Has been experiencing progressive muscle weakness in the proximal BLEs since Mother's Day and has been following up with neurology regarding this. EMG pending and muscle biopsy scheduled for later this week.   Physical Exam  BP 129/67   Pulse 84   Temp 99.3 F (37.4 C) (Oral)   Resp 16   Ht 5\' 2"  (1.575 m)   Wt 65 kg   SpO2 100%   BMI 26.21 kg/m  Gen:   Awake, no distress   Resp:  Normal effort  MSK:   Moves extremities without difficulty; 1+ pitting edema in the BLEs Other:    Medical Decision Making  Medically screening exam initiated at 12:26 PM.  Appropriate orders placed.  Tracey Castro was informed that the remainder of the evaluation will be completed by another provider, this initial triage assessment does not replace that evaluation, and the importance of remaining in the ED until their evaluation is complete.     Rayna Sexton, PA-C 12/15/20 1228    Lorelle Gibbs, DO 12/18/20 1745

## 2020-12-15 NOTE — ED Provider Notes (Signed)
[facility] EMERGENCY DEPARTMENT Provider Note   CSN: [id number] Arrival date & time: 12/15/20  1142     History Chief Complaint  Patient presents with   Leg Swelling    Tracey Castro is a 75 y.o. female with a history of COPD, hyperlipidemia.  Patient presents emergency department with a chief complaint of bilateral lower leg swelling.  Patient reports that swelling has been intermittent and ongoing for multiple weeks.  Swelling is located on her feet and ankles.  Swelling is worse when she is standing or has her feet in a dependent position for prolonged periods of time.  Swelling is improved when she elevates her feet/ankles.  Patient reports that yesterday was the worst episode of leg swelling she had.  Patient states that she was on her feet and had her legs in a dependent position for prolonged amount of time yesterday.  Patient does not wear compression stockings.  Patient denies any chest pain, shortness of breath, PND, orthopnea, numbness to extremities, exertional shortness of breath.  Patient reports that she had elevated D-dimer earlier this week.  Patient was sent for ultrasound of left upper extremity due to swelling noted to left to left shoulder/neck.  Patient endorses muscle weakness.  Has been following neurology.  He has had pending muscle biopsy scheduled for next week.   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     Family History  Problem Relation Age of Onset   Cancer - Ovarian Mother    Cancer - Colon Father    Parkinson's disease Father     Social History   Tobacco Use   Smoking status: Never   Smokeless tobacco: Never  Vaping Use   Vaping Use: Never used  Substance Use Topics   Alcohol use: Yes    Alcohol/week: 4.0 standard drinks    Types: 4 Glasses of wine per week   Drug use: Never    Home Medications Prior to Admission medications   Medication Sig Start Date End Date Taking? Authorizing Provider  BREO ELLIPTA 100-25 MCG/INH AEPB USE 1 INHALATION DAILY Patient taking differently: Inhale 1 puff into the lungs daily. 11/21/20  Yes Rigoberto Noel, MD  Cholecalciferol (VITAMIN D) 2000 units tablet Take 4,000-6,000 Units by mouth daily.   Yes [provider]  diphenhydrAMINE HCl, Sleep, 25 MG CAPS Take 25 mg by mouth daily as needed (sleep).   Yes [provider]  fluticasone (FLONASE) 50 MCG/ACT nasal spray Place 1 spray into both nostrils daily as needed for allergies.   Yes [provider]  ibuprofen (  ADVIL,MOTRIN) 200 MG tablet Take 600 mg by mouth 2 (two) times daily as needed for headache or moderate pain.   Yes [provider]  Magnesium 500 MG CAPS Take 500 mg by mouth daily.   Yes [provider]  Multiple Vitamin (MULTIVITAMIN) capsule Take 1 capsule by mouth daily.   Yes [provider]  tretinoin (RETIN-A) 0.025 % cream Apply 1 application topically at bedtime. 06/29/20  Yes [provider]    Allergies    Sulfamethoxazole  Review of Systems   Review of Systems  Constitutional:   Negative for chills and fever.  Eyes:  Negative for visual disturbance.  Respiratory:  Negative for shortness of breath.   Cardiovascular:  Positive for leg swelling. Negative for chest pain and palpitations.  Gastrointestinal:  Negative for abdominal pain, nausea and vomiting.  Genitourinary:  Negative for difficulty urinating and dysuria.  Musculoskeletal:  Negative for back pain and neck pain.  Skin:  Positive for rash. Negative for color change.  Neurological:  Positive for weakness. Negative for dizziness, syncope, light-headedness, numbness and headaches.  Psychiatric/Behavioral:  Negative for confusion.    Physical Exam Updated Vital Signs BP (!) 129/52 (BP Location: Left Arm)   Pulse 79   Temp 98.6 F (37 C) (Oral)   Resp 15   Ht _0  (1.575 m)   Wt 65 kg   SpO2 100%   BMI 26.21 kg/m   Physical Exam Vitals and nursing note reviewed.  Constitutional:      General: She is not in acute distress.    Appearance: She is not ill-appearing, toxic-appearing or diaphoretic.  HENT:     Head: Normocephalic.  Eyes:     General: No scleral icterus.       Right eye: No discharge.        Left eye: No discharge.  Cardiovascular:     Rate and Rhythm: Normal rate.     Pulses:          Dorsalis pedis pulses are 2+ on the right side and 2+ on the left side.  Pulmonary:     Effort: Pulmonary effort is normal. No tachypnea, bradypnea or respiratory distress.     Breath sounds: Normal breath sounds. No stridor.     Comments: Able to speak in full complete sentences without difficulty  Musculoskeletal:     Right lower leg: No swelling or tenderness. No edema.     Left lower leg: No swelling or tenderness. No edema.     Right ankle: Swelling present. No deformity, ecchymosis or lacerations. No tenderness. Normal range of motion.     Left ankle: Swelling present. No deformity, ecchymosis or lacerations. No tenderness. Normal range of motion.     Right foot: Normal range of motion and  normal capillary refill. No swelling, deformity, laceration, tenderness, bony tenderness or crepitus. Normal pulse.     Left foot: Normal range of motion and normal capillary refill. No swelling, deformity, laceration, tenderness, bony tenderness or crepitus. Normal pulse.  Skin:    General: Skin is warm and dry.  Neurological:     General: No focal deficit present.     Mental Status: She is alert.  Psychiatric:        Behavior: Behavior is cooperative.    ED Results / Procedures / Treatments   Labs (all labs ordered are listed, but only abnormal results are displayed) Labs Reviewed  COMPREHENSIVE METABOLIC PANEL - Abnormal; Notable for the following components:  Result Value   Potassium 3.4 (*)    Glucose, Bld 105 (*)    Total Protein 6.2 (*)    Albumin 3.2 (*)    All other components within normal limits  CBC WITH DIFFERENTIAL/PLATELET - Abnormal; Notable for the following components:   RBC 3.68 (*)    Hemoglobin 10.7 (*)    HCT 33.0 (*)    RDW 16.5 (*)    Platelets 463 (*)    Abs Immature Granulocytes 0.16 (*)    All other components within normal limits  BRAIN NATRIURETIC PEPTIDE - Abnormal; Notable for the following components:   B Natriuretic Peptide 133.6 (*)    All other components within normal limits    EKG EKG Interpretation  Date/Time:  Friday December 15 2020 12:03:19 EDT Ventricular Rate:  79 PR Interval:  130 QRS Duration: 84 QT Interval:  378 QTC Calculation: 433 R Axis:   40 Text Interpretation: Normal sinus rhythm Nonspecific ST abnormality Abnormal ECG No significant change since last tracing Confirmed by Wandra Arthurs [phone]) on 12/15/2020 5:24:42 PM  Radiology DG Chest 1 View  Result Date: 12/15/2020 CLINICAL DATA:  Peripheral edema EXAM: CHEST  1 VIEW COMPARISON:  09/06/2019, CT from 12/01/2020 FINDINGS: Cardiac shadow is within normal limits. Lungs are well aerated bilaterally. Stable lingular and right middle lobe scarring and bronchiectasis. No  acute infiltrate is seen. No effusion is noted. No bony abnormality is noted. IMPRESSION: Stable scarring in the right middle lobe and lingula. No acute abnormality noted. Electronically Signed   By: Inez Catalina M.D.   On: 12/15/2020 13:11   VAS [place] LOWER EXTREMITY VENOUS (DVT) (ONLY [facility] & [facility])  Result Date: 12/15/2020  Lower Venous DVT Study Patient Name:  Tracey Castro  Date of Exam:   12/15/2020 Medical Rec #: [id number]             Accession #:    [id number] Date of Birth: 10-Sep-1945              Patient Gender: F Patient Age:   73Y Exam Location:  [facility] Procedure:      VAS [place] LOWER EXTREMITY VENOUS (DVT) Referring Phys: [id number] Rudell Cobb Rodrigus Kilker --------------------------------------------------------------------------------  Indications: Swelling, and Edema.  Comparison Study: no prior Performing Technologist: Archie Patten RVS  Examination Guidelines: A complete evaluation includes B-mode imaging, spectral Doppler, color Doppler, and power Doppler as needed of all accessible portions of each vessel. Bilateral testing is considered an integral part of a complete examination. Limited examinations for reoccurring indications may be performed as noted. The reflux portion of the exam is performed with the patient in reverse Trendelenburg.  +---------+---------------+---------+-----------+----------+--------------+ RIGHT    CompressibilityPhasicitySpontaneityPropertiesThrombus Aging +---------+---------------+---------+-----------+----------+--------------+ CFV      Full           Yes      Yes                                 +---------+---------------+---------+-----------+----------+--------------+ SFJ      Full                                                        +---------+---------------+---------+-----------+----------+--------------+ FV Prox  Full                                                         +---------+---------------+---------+-----------+----------+--------------+  FV Mid   Full                                                        +---------+---------------+---------+-----------+----------+--------------+ FV DistalFull                                                        +---------+---------------+---------+-----------+----------+--------------+ PFV      Full                                                        +---------+---------------+---------+-----------+----------+--------------+ POP      Full           Yes      Yes                                 +---------+---------------+---------+-----------+----------+--------------+ PTV      Full                                                        +---------+---------------+---------+-----------+----------+--------------+ PERO     Full                                                        +---------+---------------+---------+-----------+----------+--------------+   +---------+---------------+---------+-----------+----------+--------------+ LEFT     CompressibilityPhasicitySpontaneityPropertiesThrombus Aging +---------+---------------+---------+-----------+----------+--------------+ CFV      Full           Yes      Yes                                 +---------+---------------+---------+-----------+----------+--------------+ SFJ      Full                                                        +---------+---------------+---------+-----------+----------+--------------+ FV Prox  Full                                                        +---------+---------------+---------+-----------+----------+--------------+ FV Mid   Full                                                        +---------+---------------+---------+-----------+----------+--------------+  FV DistalFull                                                         +---------+---------------+---------+-----------+----------+--------------+ PFV      Full                                                        +---------+---------------+---------+-----------+----------+--------------+ POP      Full           Yes      Yes                                 +---------+---------------+---------+-----------+----------+--------------+ PTV      Full                                                        +---------+---------------+---------+-----------+----------+--------------+ PERO     Full                                                        +---------+---------------+---------+-----------+----------+--------------+     Summary: BILATERAL: - No evidence of deep vein thrombosis seen in the lower extremities, bilaterally. -No evidence of popliteal cyst, bilaterally.   *See table(s) above for measurements and observations.    Preliminary    VAS [place] UPPER EXTREMITY VENOUS DUPLEX  Result Date: 12/14/2020 UPPER VENOUS STUDY  Patient Name:  Tracey Castro  Date of Exam:   12/14/2020 Medical Rec #: [id number]             Accession #:    [id number] Date of Birth: 09/17/1945              Patient Gender: F Patient Age:   51Y Exam [facility] Procedure:      VAS [place] UPPER EXTREMITY VENOUS DUPLEX Referring Phys: [id number] ROBYN SANDERS --------------------------------------------------------------------------------  Indications: Swelling Other Indications: Fullness in the supraclavicular fossa. Performing Technologist: Ralene Cork RVT  Examination Guidelines: A complete evaluation includes B-mode imaging, spectral Doppler, color Doppler, and power Doppler as needed of all accessible portions of each vessel. Bilateral testing is considered an integral part of a complete examination. Limited examinations for reoccurring indications may be performed as noted.  Left Findings: +----------+------------+---------+-----------+----------+-------+  LEFT      CompressiblePhasicitySpontaneousPropertiesSummary +----------+------------+---------+-----------+----------+-------+ IJV           Full       Yes       Yes                      +----------+------------+---------+-----------+----------+-------+ Subclavian    Full       Yes       Yes                      +----------+------------+---------+-----------+----------+-------+  Axillary      Full       Yes       Yes                      +----------+------------+---------+-----------+----------+-------+ Brachial      Full       Yes       Yes                      +----------+------------+---------+-----------+----------+-------+ Cephalic      Full                                          +----------+------------+---------+-----------+----------+-------+ Basilic       Full       Yes       Yes                      +----------+------------+---------+-----------+----------+-------+  Findings reported to Message left on Dr. Lynder Parents voicemail. at 11:30 am.  Summary:  Left: No evidence of deep vein thrombosis in the upper extremity. No evidence of superficial vein thrombosis in the upper extremity.  *See table(s) above for measurements and observations.  Diagnosing physician: Ruta Hinds MD Electronically signed by Ruta Hinds MD on 12/14/2020 at 11:44:58 AM.    Final     Procedures Procedures   Medications Ordered in ED Medications  furosemide (LASIX) tablet 40 mg (40 mg Oral Not Given 12/15/20 1902)  furosemide (LASIX) tablet 40 mg (40 mg Oral Given 12/15/20 1854)    ED Course  I have reviewed the triage vital signs and the nursing notes.  Pertinent labs & imaging results that were available during my care of the patient were reviewed by me and considered in my medical decision making (see chart for details).    MDM Rules/Calculators/A&P                          Alert 75 year old female no acute distress, nontoxic-appearing.  Patient presents emergency  department with complaint of swelling to bilateral ankles and feet.  Patient has been having this problem intermittently for "weeks."  Swelling is aggravated with episodes of standing or having feet in dependent position.  Symptom is improved with elevation of feet.  Patient does not wear any compression stockings or socks.  Patient has no history of CHF.  Patient denies any shortness of breath, PND, orthopnea, chest pain.  On physical exam patient has edema noted to bilateral ankles.  No swelling, edema, or tenderness to patient's calf.  +2 dorsalis pedis pulse bilaterally.  Patient has cap refill less than 2 seconds in all toes.  Patient has sensation intact to all toes.  Patient has full range of motion to bilateral ankles and toes.  Patient had positive D-dimer earlier this week.  Will obtain bilateral ultrasound of lower extremities study to evaluate for possible DVT.  Ultrasound shows no evidence of DVT.  CBC shows anemia, appears to be at patient's baseline. CMP shows total protein albumin slightly decreased; low suspicion for this as cause of patient's edema.  Chest x-ray shows no evidence of pleural effusion.  Lungs clear to auscultation bilaterally.  Patient has no associated chest pain, PND, orthopnea.  BNP is elevated at 133.6.  Low suspicion for CHF as cause of patient's symptoms.  Will give patient 44  mg Lasix in the emergency department and discharge her on 20 mg Lasix x3 days.  Patient was advised to follow-up with her primary care provider for possible echocardiogram in outpatient setting.  Patient is hemodynamically stable.  Will discharge patient at this time.  Patient advised to wear compression stockings, elevate legs as much as possible and adhere to a low-sodium diet to help reduce peripheral edema.  Patient given strict return precautions.  Patient expressed understanding of all instructions and is agreeable with this plan.  Patient was discussed with and evaluated by Dr.  Darl Householder.   Final Clinical Impression(s) / ED Diagnoses Final diagnoses:  Leg swelling    Rx / DC Orders ED Discharge Orders          Ordered    furosemide (LASIX) 20 MG tablet  Daily        12/15/20 1904             Dyann Ruddle 12/15/20 2122    Drenda Freeze, MD 12/15/20 2149

## 2020-12-19 ENCOUNTER — Encounter: Payer: Self-pay | Admitting: Nurse Practitioner

## 2020-12-19 ENCOUNTER — Telehealth: Payer: Self-pay

## 2020-12-19 ENCOUNTER — Ambulatory Visit (INDEPENDENT_AMBULATORY_CARE_PROVIDER_SITE_OTHER): Payer: Medicare Other | Admitting: Nurse Practitioner

## 2020-12-19 ENCOUNTER — Other Ambulatory Visit: Payer: Self-pay

## 2020-12-19 VITALS — BP 104/60 | HR 76 | Temp 98.0°F | Ht 60.6 in | Wt 141.8 lb

## 2020-12-19 DIAGNOSIS — R600 Localized edema: Secondary | ICD-10-CM

## 2020-12-19 DIAGNOSIS — Z6827 Body mass index (BMI) 27.0-27.9, adult: Secondary | ICD-10-CM | POA: Diagnosis not present

## 2020-12-19 DIAGNOSIS — E663 Overweight: Secondary | ICD-10-CM | POA: Diagnosis not present

## 2020-12-19 MED ORDER — HYDROCHLOROTHIAZIDE 12.5 MG PO CAPS: ORAL_CAPSULE | ORAL | 0 refills | Status: DC

## 2020-12-19 NOTE — Telephone Encounter (Signed)
Transition Care Management Follow-up Telephone Call Date of discharge and from where: 12/15/20 How have you been since you were released from the hospital? better Any questions or concerns? No  Items Reviewed: Did the pt receive and understand the discharge instructions provided? Yes  Medications obtained and verified? Yes  Other? No  Any new allergies since your discharge? Yes  Dietary orders reviewed? Yes Do you have support at home? Yes   Home Care and Equipment/Supplies: Were home health services ordered? not applicable If so, what is the name of the agency? na  Has the agency set up a time to come to the patient's home? not applicable Were any new equipment or medical supplies ordered?  No What is the name of the medical supply agency? na Were you able to get the supplies/equipment? not applicable Do you have any questions related to the use of the equipment or supplies? No  Functional Questionnaire: (I = Independent and D = Dependent) ADLs: I   Bathing/Dressing- I  Meal Prep- I  Eating- I  Maintaining continence- I  Transferring/Ambulation- I  Managing Meds- I  Follow up appointments reviewed:  PCP Hospital f/u appt confirmed? Yes  Scheduled to see GHUMMAN on 12/19/20 @ 1230. [facility] f/u appt confirmed? Yes   Are transportation arrangements needed? No  If their condition worsens, is the pt aware to call PCP or go to the Emergency Dept.? Yes Was the patient provided with contact information for the PCP's office or ED? Yes Was to pt encouraged to call back with questions or concerns? Yes

## 2020-12-19 NOTE — Progress Notes (Signed)
I,Tianna Badgett,acting as a Education administrator for [employer], NP.,have documented all relevant documentation on the behalf of [employer], NP,as directed by  Bary Castilla, NP while in the presence of Bary Castilla, NP.  This visit occurred during the SARS-CoV-2 public health emergency.  Safety protocols were in place, including screening questions prior to the visit, additional usage of staff PPE, and extensive cleaning of exam room while observing appropriate contact time as indicated for disinfecting solutions.  Subjective:     Patient ID: Tracey Castro , female    DOB: September 04, 1945 , 75 y.o.   MRN: [id number]   Chief Complaint  Patient presents with   Follow-up    HPI  Patient is here for ED follow up. She went with complaints of leg swelling. She was prescribed lasix and it has since improved. She was only given 3 days worth of lasix but was told that her PCP should prescribe for PRN needs. She states that she was told in the ED that she should have an ECHO done to rule out heart issues since these test are only performed on admitted patients.     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([place])      Family History  Problem Relation Age of Onset   Cancer - Ovarian Mother    Cancer - Colon Father    Parkinson's disease Father      Current Outpatient Medications:    hydrochlorothiazide (MICROZIDE) 12.5 MG capsule, Take 1 tablet by mouth as needed., Disp: 30 capsule, Rfl: 0   BREO ELLIPTA 100-25 MCG/INH AEPB, USE 1 INHALATION DAILY (Patient taking differently: Inhale 1 puff into the lungs daily.), Disp: 60 each, Rfl: 0   Cholecalciferol (VITAMIN D) 2000 units tablet, Take 4,000-6,000 Units by mouth daily., Disp: , Rfl:    diphenhydrAMINE HCl, Sleep, 25 MG CAPS, Take 25 mg by mouth daily as needed (sleep)., Disp: , Rfl:    fluticasone (FLONASE) 50 MCG/ACT nasal spray, Place 1 spray into both nostrils daily as needed for  allergies., Disp: , Rfl:    furosemide (LASIX) 20 MG tablet, Take 1 tablet (20 mg total) by mouth daily for 3 days., Disp: 3 tablet, Rfl: 0   ibuprofen (ADVIL,MOTRIN) 200 MG tablet, Take 600 mg by mouth 2 (two) times daily as needed for headache or moderate pain., Disp: , Rfl:    Magnesium 500 MG CAPS, Take 500 mg by mouth daily., Disp: , Rfl:    Multiple Vitamin (MULTIVITAMIN) capsule, Take 1 capsule by mouth daily., Disp: , Rfl:    tretinoin (RETIN-A) 0.025 % cream, Apply 1 application topically at bedtime., Disp: , Rfl:    Allergies  Allergen Reactions   Sulfamethoxazole Rash     Review of Systems  Constitutional: Negative.  Negative for chills and fever.  HENT:  Negative for congestion, sinus pressure and sinus pain.   Respiratory: Negative.  Negative for chest tightness, shortness of breath and wheezing.   Cardiovascular:  Positive for leg swelling. Negative for chest pain and palpitations.  Gastrointestinal: Negative.   Neurological: Negative.  Negative for numbness and headaches.    Today's Vitals   12/19/20 1247  BP: 104/60  Pulse: 76  Temp: 98 F (36.7 C)  TempSrc: Oral  Weight: 141 lb 12.8 oz (64.3 kg)  Height: 5' 0.6" (1.539 m)   Body mass index is 27.15 kg/m.  Wt Readings from Last 3 Encounters:  12/19/20 141 lb 12.8 oz (64.3 kg)  12/15/20 143 lb 4.8 oz (65 kg)  11/27/20 143 lb 3.2 oz (65 kg)    Objective:  Physical Exam Constitutional:      Appearance: Normal appearance.  HENT:     Head: Normocephalic and atraumatic.  Cardiovascular:     Rate and Rhythm: Normal rate and regular rhythm.     Pulses: Normal pulses.     Heart sounds: Normal heart sounds. No murmur heard. Pulmonary:     Effort: Pulmonary effort is normal. No respiratory distress.     Breath sounds: Normal breath sounds.  Musculoskeletal:     Right lower leg: 1+ Edema present.     Left lower leg: 1+ Edema present.  Skin:    General: Skin is warm and dry.     Capillary Refill: Capillary  refill takes less than 2 seconds.  Neurological:     Mental Status: She is alert and oriented to person, place, and time.        Assessment And Plan:     1. Lower extremity edema -Patient here for a follow up on lower leg bilateral edema. The patient states she has been feeling a lot better since taking the lasix 20 mg x 3 days from the hospital discharge.  -Will prescribe her HCTZ 12.5 as needed  -Advised patient to elevate her legs  -Advised patient to wear compression stockings.  - hydrochlorothiazide (MICROZIDE) 12.5 MG capsule; Take 1 tablet by mouth as needed.  Dispense: 30 capsule; Refill: 0 - ECHOCARDIOGRAM COMPLETE; Future  2. Overweight with body mass index (BMI) of 27 to 27.9 in adult Advised patient on a healthy diet including avoiding fast food and red meats. Increase the intake of lean meats including grilled chicken and [place].  Drink a lot of water. Decrease intake of fatty foods. Exercise for 30-45 min. 4-5 a week to decrease the risk of cardiac event.   The patient was encouraged to call or send a message through [place] for any questions or concerns.   Follow up: if symptoms persist or do not get better.   Side effects and appropriate use of all the medication(s) were discussed with the patient today. Patient advised to use the medication(s) as directed by their healthcare provider. The patient was encouraged to read, review, and understand all associated package inserts and contact our office with any questions or concerns. The patient accepts the risks of the treatment plan and had an opportunity to ask questions.   Patient was given opportunity to ask questions. Patient verbalized understanding of the plan and was able to repeat key elements of the plan. All questions were answered to their satisfaction.  Raman Kenedi Cilia, DNP   I, Raman Reiko Vinje have reviewed all documentation for this visit. The documentation on 12/19/20 for the exam, diagnosis, procedures, and orders  are all accurate and complete.    IF YOU HAVE BEEN REFERRED TO A SPECIALIST, IT MAY TAKE 1-2 WEEKS TO SCHEDULE/PROCESS THE REFERRAL. IF YOU HAVE NOT HEARD FROM US/SPECIALIST IN TWO WEEKS, PLEASE GIVE [place] A CALL AT [phone].   THE PATIENT IS ENCOURAGED TO PRACTICE SOCIAL DISTANCING DUE TO THE COVID-19 PANDEMIC.

## 2020-12-19 NOTE — Patient Instructions (Signed)
Edema  Edema is when you have too much fluid in your body or under your skin. Edema may make your legs, feet, and ankles swell up. Swelling is also common in looser tissues, like around your eyes. This is a common condition. It gets more common as you get older. There are many possible causes of edema. Eating too much salt (sodium) and being on your feet or sitting for a long time can cause edema in yourlegs, feet, and ankles. Hot weather may make edema worse. Edema is usually painless. Your skin may look swollen or shiny. Follow these instructions at home: Keep the swollen body part raised (elevated) above the level of your heart when you are sitting or lying down. Do not sit still or stand for a long time. Do not wear tight clothes. Do not wear garters on your upper legs. Exercise your legs. This can help the swelling go down. Wear elastic bandages or support stockings as told by your doctor. Eat a low-salt (low-sodium) diet to reduce fluid as told by your doctor. Depending on the cause of your swelling, you may need to limit how much fluid you drink (fluid restriction). Take over-the-counter and prescription medicines only as told by your doctor. Contact a doctor if: Treatment is not working. You have heart, liver, or kidney disease and have symptoms of edema. You have sudden and unexplained weight gain. Get help right away if: You have shortness of breath or chest pain. You cannot breathe when you lie down. You have pain, redness, or warmth in the swollen areas. You have heart, liver, or kidney disease and get edema all of a sudden. You have a fever and your symptoms get worse all of a sudden. Summary Edema is when you have too much fluid in your body or under your skin. Edema may make your legs, feet, and ankles swell up. Swelling is also common in looser tissues, like around your eyes. Raise (elevate) the swollen body part above the level of your heart when you are sitting or lying  down. Follow your doctor's instructions about diet and how much fluid you can drink (fluid restriction). This information is not intended to replace advice given to you by your health care provider. Make sure you discuss any questions you have with your healthcare provider. Document Revised: 03/29/2020 Document Reviewed: 03/29/2020 Elsevier Patient Education  2022 Elsevier Inc.  

## 2020-12-20 DIAGNOSIS — M6281 Muscle weakness (generalized): Secondary | ICD-10-CM | POA: Diagnosis not present

## 2020-12-20 DIAGNOSIS — R262 Difficulty in walking, not elsewhere classified: Secondary | ICD-10-CM | POA: Diagnosis not present

## 2020-12-21 DIAGNOSIS — J479 Bronchiectasis, uncomplicated: Secondary | ICD-10-CM | POA: Diagnosis not present

## 2020-12-21 DIAGNOSIS — R531 Weakness: Secondary | ICD-10-CM | POA: Diagnosis not present

## 2020-12-21 DIAGNOSIS — G729 Myopathy, unspecified: Secondary | ICD-10-CM | POA: Diagnosis not present

## 2020-12-21 DIAGNOSIS — R221 Localized swelling, mass and lump, neck: Secondary | ICD-10-CM | POA: Diagnosis not present

## 2020-12-25 DIAGNOSIS — M6281 Muscle weakness (generalized): Secondary | ICD-10-CM | POA: Diagnosis not present

## 2020-12-25 DIAGNOSIS — R262 Difficulty in walking, not elsewhere classified: Secondary | ICD-10-CM | POA: Diagnosis not present

## 2020-12-27 ENCOUNTER — Ambulatory Visit (HOSPITAL_COMMUNITY)
Admission: RE | Admit: 2020-12-27 | Discharge: 2020-12-27 | Disposition: A | Discharge status: Home / Self Care | Payer: Medicare Other | Source: Ambulatory Visit | Attending: Nurse Practitioner | Admitting: Nurse Practitioner

## 2020-12-27 DIAGNOSIS — M6281 Muscle weakness (generalized): Secondary | ICD-10-CM | POA: Diagnosis not present

## 2020-12-27 DIAGNOSIS — I088 Other rheumatic multiple valve diseases: Secondary | ICD-10-CM | POA: Insufficient documentation

## 2020-12-27 DIAGNOSIS — E785 Hyperlipidemia, unspecified: Secondary | ICD-10-CM | POA: Diagnosis not present

## 2020-12-27 DIAGNOSIS — R600 Localized edema: Secondary | ICD-10-CM | POA: Diagnosis not present

## 2020-12-27 DIAGNOSIS — R262 Difficulty in walking, not elsewhere classified: Secondary | ICD-10-CM | POA: Diagnosis not present

## 2020-12-27 LAB — ECHOCARDIOGRAM COMPLETE
Area-P 1/2: 4.71 cm2
MV M vel: 4.96 m/s
MV Peak grad: 98.4 mmHg
P 1/2 time: 623 msec
S' Lateral: 2.9 cm

## 2020-12-28 ENCOUNTER — Encounter: Payer: Self-pay | Admitting: Pulmonary Disease

## 2020-12-28 ENCOUNTER — Ambulatory Visit (INDEPENDENT_AMBULATORY_CARE_PROVIDER_SITE_OTHER): Payer: Medicare Other | Admitting: Pulmonary Disease

## 2020-12-28 ENCOUNTER — Other Ambulatory Visit: Payer: Self-pay

## 2020-12-28 DIAGNOSIS — J449 Chronic obstructive pulmonary disease, unspecified: Secondary | ICD-10-CM | POA: Diagnosis not present

## 2020-12-28 DIAGNOSIS — J479 Bronchiectasis, uncomplicated: Secondary | ICD-10-CM

## 2020-12-28 NOTE — Assessment & Plan Note (Signed)
Bronchiectasis appears stable, we discussed signs and symptoms of flare and treatment plan for this. MAC has been ruled out by bronchoscopy in the past, more likely that bronchiectasis is sequelae of repeated infections

## 2020-12-28 NOTE — Patient Instructions (Signed)
   Stay on Breo You have bronchiectasis & scarring in both your lungs

## 2020-12-28 NOTE — Assessment & Plan Note (Signed)
She has chronic airway obstruction which is being treated as asthma with Breo, we will continue this at the current time due to subjective improvement

## 2020-12-28 NOTE — Progress Notes (Signed)
   Subjective:    Patient ID: Tracey Castro, female    DOB: 09-26-1945, 75 y.o.   MRN: [id number]  HPI  75 yo never smoker for follow-up of bronchiectasis and chronic cough Although never smoker, recurrent spirometry have shown airway obstruction, this may be related to bronchiectasis.  Clinical history is not consistent with asthma  Shortness of breath is at baseline.  She reports significant mobility issues and has seen a neurologist and has been referred to a neuromuscular specialist.  She is undergoing physical therapy. 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, CT chest was performed which was reviewed She is compliant with Breo, denies cough or sputum production  Significant tests/ events reviewed  Ct chest w con 11/2020 >> bronchiectasis and scarring in right middle lobe and lingula, stable   CT chest 01/2018 Focal dense chronic appearing consolidations within the lingula and RIGHT middle lobe, with associated traction bronchiectasis,  Quant TB  02/19/18 neg - Quant Ig's 03/18/2018 nl - Alpha one AT screen  03/18/18  MM, level 128 - FOB 03/27/2018   Minimal  secretions>>>   BAL lingula >>>  Neg cytology,  Neg afb , fungal   Allergy profile 03/18/2018 >  Eos 0.1 /  IgE 4 RAST neg     Spirometry 03/18/2018  FEV1 1.0 (49%)  Ratio 70 - PFT's  05/08/2018  FEV1 1.30 (65 % ) ratio 65 p 14 % improvement , DLCO  67 % corrects to 91  % for alv volume  -    Spirometry 08/2018 worsening airway obstruction with ratio of 69, FEV1 52% FVC 57%  PFTs 05/2020 ratio 72, FEV1 64%/ 1.74 , FVC 67% , TLC nml, DLCO nml  Review of Systems neg for any significant sore throat, dysphagia, itching, sneezing, nasal congestion or excess/ purulent secretions, fever, chills, sweats, unintended wt loss, pleuritic or exertional cp, hempoptysis, orthopnea pnd or change in chronic leg swelling. Also denies presyncope, palpitations, heartburn, abdominal pain, nausea, vomiting, diarrhea or change  in bowel or urinary habits, dysuria,hematuria, rash, arthralgias, visual complaints, headache, numbness weakness or ataxia.     Objective:   Physical Exam  Gen. Pleasant, elderly,well-nourished, in no distress ENT - no thrush, no pallor/icterus,no post nasal drip Neck: No JVD, no thyromegaly, no carotid bruits Lungs: no use of accessory muscles, no dullness to percussion, LT axillary rales no rhonchi  Cardiovascular: Rhythm regular, heart sounds  normal, no murmurs or gallops, no peripheral edema Musculoskeletal: No deformities, no cyanosis or clubbing         Assessment & Plan:

## 2020-12-30 DIAGNOSIS — Z23 Encounter for immunization: Secondary | ICD-10-CM | POA: Diagnosis not present

## 2021-01-01 ENCOUNTER — Ambulatory Visit: Payer: Medicare Other | Admitting: Internal Medicine

## 2021-01-01 DIAGNOSIS — M6281 Muscle weakness (generalized): Secondary | ICD-10-CM | POA: Diagnosis not present

## 2021-01-01 DIAGNOSIS — R262 Difficulty in walking, not elsewhere classified: Secondary | ICD-10-CM | POA: Diagnosis not present

## 2021-01-03 DIAGNOSIS — R262 Difficulty in walking, not elsewhere classified: Secondary | ICD-10-CM | POA: Diagnosis not present

## 2021-01-03 DIAGNOSIS — M6281 Muscle weakness (generalized): Secondary | ICD-10-CM | POA: Diagnosis not present

## 2021-01-08 ENCOUNTER — Ambulatory Visit: Payer: Medicare Other | Admitting: Internal Medicine

## 2021-01-08 DIAGNOSIS — M6250 Muscle wasting and atrophy, not elsewhere classified, unspecified site: Secondary | ICD-10-CM | POA: Diagnosis not present

## 2021-01-08 DIAGNOSIS — R531 Weakness: Secondary | ICD-10-CM | POA: Diagnosis not present

## 2021-01-09 DIAGNOSIS — L720 Epidermal cyst: Secondary | ICD-10-CM | POA: Diagnosis not present

## 2021-01-09 DIAGNOSIS — D692 Other nonthrombocytopenic purpura: Secondary | ICD-10-CM | POA: Diagnosis not present

## 2021-01-12 DIAGNOSIS — R262 Difficulty in walking, not elsewhere classified: Secondary | ICD-10-CM | POA: Diagnosis not present

## 2021-01-12 DIAGNOSIS — M6281 Muscle weakness (generalized): Secondary | ICD-10-CM | POA: Diagnosis not present

## 2021-01-16 DIAGNOSIS — R262 Difficulty in walking, not elsewhere classified: Secondary | ICD-10-CM | POA: Diagnosis not present

## 2021-01-16 DIAGNOSIS — M6281 Muscle weakness (generalized): Secondary | ICD-10-CM | POA: Diagnosis not present

## 2021-01-18 DIAGNOSIS — M6281 Muscle weakness (generalized): Secondary | ICD-10-CM | POA: Diagnosis not present

## 2021-01-18 DIAGNOSIS — R262 Difficulty in walking, not elsewhere classified: Secondary | ICD-10-CM | POA: Diagnosis not present

## 2021-01-22 DIAGNOSIS — R262 Difficulty in walking, not elsewhere classified: Secondary | ICD-10-CM | POA: Diagnosis not present

## 2021-01-22 DIAGNOSIS — M6281 Muscle weakness (generalized): Secondary | ICD-10-CM | POA: Diagnosis not present

## 2021-01-24 DIAGNOSIS — M6281 Muscle weakness (generalized): Secondary | ICD-10-CM | POA: Diagnosis not present

## 2021-01-24 DIAGNOSIS — R262 Difficulty in walking, not elsewhere classified: Secondary | ICD-10-CM | POA: Diagnosis not present

## 2021-01-29 ENCOUNTER — Encounter: Payer: Self-pay | Admitting: Internal Medicine

## 2021-01-30 DIAGNOSIS — M6281 Muscle weakness (generalized): Secondary | ICD-10-CM | POA: Diagnosis not present

## 2021-01-30 DIAGNOSIS — R262 Difficulty in walking, not elsewhere classified: Secondary | ICD-10-CM | POA: Diagnosis not present

## 2021-02-02 DIAGNOSIS — M6281 Muscle weakness (generalized): Secondary | ICD-10-CM | POA: Diagnosis not present

## 2021-02-02 DIAGNOSIS — R262 Difficulty in walking, not elsewhere classified: Secondary | ICD-10-CM | POA: Diagnosis not present

## 2021-02-05 DIAGNOSIS — R262 Difficulty in walking, not elsewhere classified: Secondary | ICD-10-CM | POA: Diagnosis not present

## 2021-02-05 DIAGNOSIS — M6281 Muscle weakness (generalized): Secondary | ICD-10-CM | POA: Diagnosis not present

## 2021-02-07 DIAGNOSIS — M6281 Muscle weakness (generalized): Secondary | ICD-10-CM | POA: Diagnosis not present

## 2021-02-07 DIAGNOSIS — R262 Difficulty in walking, not elsewhere classified: Secondary | ICD-10-CM | POA: Diagnosis not present

## 2021-02-09 DIAGNOSIS — R262 Difficulty in walking, not elsewhere classified: Secondary | ICD-10-CM | POA: Diagnosis not present

## 2021-02-09 DIAGNOSIS — M6281 Muscle weakness (generalized): Secondary | ICD-10-CM | POA: Diagnosis not present

## 2021-02-12 DIAGNOSIS — M6281 Muscle weakness (generalized): Secondary | ICD-10-CM | POA: Diagnosis not present

## 2021-02-12 DIAGNOSIS — R262 Difficulty in walking, not elsewhere classified: Secondary | ICD-10-CM | POA: Diagnosis not present

## 2021-02-21 DIAGNOSIS — R262 Difficulty in walking, not elsewhere classified: Secondary | ICD-10-CM | POA: Diagnosis not present

## 2021-02-21 DIAGNOSIS — M6281 Muscle weakness (generalized): Secondary | ICD-10-CM | POA: Diagnosis not present

## 2021-02-23 DIAGNOSIS — M6281 Muscle weakness (generalized): Secondary | ICD-10-CM | POA: Diagnosis not present

## 2021-02-23 DIAGNOSIS — R262 Difficulty in walking, not elsewhere classified: Secondary | ICD-10-CM | POA: Diagnosis not present

## 2021-02-26 DIAGNOSIS — M1611 Unilateral primary osteoarthritis, right hip: Secondary | ICD-10-CM | POA: Diagnosis not present

## 2021-02-26 DIAGNOSIS — M6281 Muscle weakness (generalized): Secondary | ICD-10-CM | POA: Diagnosis not present

## 2021-02-26 DIAGNOSIS — R262 Difficulty in walking, not elsewhere classified: Secondary | ICD-10-CM | POA: Diagnosis not present

## 2021-02-28 ENCOUNTER — Other Ambulatory Visit: Payer: Self-pay | Admitting: Pulmonary Disease

## 2021-03-01 DIAGNOSIS — M6281 Muscle weakness (generalized): Secondary | ICD-10-CM | POA: Diagnosis not present

## 2021-03-01 DIAGNOSIS — R262 Difficulty in walking, not elsewhere classified: Secondary | ICD-10-CM | POA: Diagnosis not present

## 2021-03-05 DIAGNOSIS — M6281 Muscle weakness (generalized): Secondary | ICD-10-CM | POA: Diagnosis not present

## 2021-03-05 DIAGNOSIS — R262 Difficulty in walking, not elsewhere classified: Secondary | ICD-10-CM | POA: Diagnosis not present

## 2021-03-07 ENCOUNTER — Telehealth: Payer: Self-pay | Admitting: Pulmonary Disease

## 2021-03-07 DIAGNOSIS — M6281 Muscle weakness (generalized): Secondary | ICD-10-CM | POA: Diagnosis not present

## 2021-03-07 DIAGNOSIS — R262 Difficulty in walking, not elsewhere classified: Secondary | ICD-10-CM | POA: Diagnosis not present

## 2021-03-07 MED ORDER — FLUTICASONE FUROATE-VILANTEROL 100-25 MCG/INH IN AEPB: 1.0000 | INHALATION_SPRAY | Freq: Every day | RESPIRATORY_TRACT | 1 refills | Status: DC

## 2021-03-07 NOTE — Telephone Encounter (Signed)
Refill has been sent to the pharmacy per pts request.  

## 2021-03-14 DIAGNOSIS — M6281 Muscle weakness (generalized): Secondary | ICD-10-CM | POA: Diagnosis not present

## 2021-03-14 DIAGNOSIS — R262 Difficulty in walking, not elsewhere classified: Secondary | ICD-10-CM | POA: Diagnosis not present

## 2021-03-23 DIAGNOSIS — Z23 Encounter for immunization: Secondary | ICD-10-CM | POA: Diagnosis not present

## 2021-04-02 ENCOUNTER — Encounter: Payer: Self-pay | Admitting: Internal Medicine

## 2021-04-04 ENCOUNTER — Encounter: Payer: Self-pay | Admitting: Nurse Practitioner

## 2021-04-04 ENCOUNTER — Other Ambulatory Visit: Payer: Self-pay

## 2021-04-04 ENCOUNTER — Ambulatory Visit (INDEPENDENT_AMBULATORY_CARE_PROVIDER_SITE_OTHER): Payer: Medicare Other | Admitting: Nurse Practitioner

## 2021-04-04 VITALS — BP 118/64 | HR 79 | Temp 98.4°F | Ht 62.0 in | Wt 138.8 lb

## 2021-04-04 DIAGNOSIS — R600 Localized edema: Secondary | ICD-10-CM | POA: Diagnosis not present

## 2021-04-04 MED ORDER — HYDROCHLOROTHIAZIDE 12.5 MG PO CAPS: ORAL_CAPSULE | ORAL | 1 refills | Status: DC

## 2021-04-04 NOTE — Progress Notes (Signed)
[employer] as a Education administrator for [employer], NP.,have documented all relevant documentation on the behalf of [employer], NP,as directed by  Bary Castilla, NP while in the presence of Bary Castilla, NP.  This visit occurred during the SARS-CoV-2 public health emergency.  Safety protocols were in place, including screening questions prior to the visit, additional usage of staff PPE, and extensive cleaning of exam room while observing appropriate contact time as indicated for disinfecting solutions.  Subjective:     Patient ID: Tracey Castro , female    DOB: 1946-02-16 , 75 y.o.   MRN: [id number]   Chief Complaint  Patient presents with   Leg Swelling      HPI  Pt presents today for leg swelling. She reports to the swelling comes and goes, she has had this for a long time. Pt reports to the swelling getting worse with the heat, being she visited [place] recently. She has seen the cardiologist in the past as well as vein and vascular; nothing seems to help. She also has an appt with neurologist to rule out MS and other genetic testing.     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([place])      Family History  Problem Relation Age of Onset   Cancer - Ovarian Mother    Cancer - Colon Father    Parkinson's disease Father      Current Outpatient Medications:    Cholecalciferol (VITAMIN D) 2000 units tablet, Take 4,000-6,000 Units by mouth daily., Disp: , Rfl:    fluticasone (FLONASE) 50 MCG/ACT nasal spray, Place 1 spray into both nostrils daily as needed for allergies., Disp: , Rfl:    fluticasone furoate-vilanterol (BREO ELLIPTA) 100-25 MCG/INH AEPB, Inhale 1 puff into the lungs daily. USE 1 INHALATION DAILY, Disp: 180 each, Rfl: 1   ibuprofen (ADVIL,MOTRIN) 200 MG tablet, Take 600 mg by mouth 2 (two) times daily as needed for headache or moderate pain., Disp: , Rfl:    Magnesium 500 MG CAPS, Take 500 mg by  mouth daily., Disp: , Rfl:    Multiple Vitamin (MULTIVITAMIN) capsule, Take 1 capsule by mouth daily., Disp: , Rfl:    diphenhydrAMINE HCl, Sleep, 25 MG CAPS, Take 25 mg by mouth daily as needed (sleep)., Disp: , Rfl:    furosemide (LASIX) 20 MG tablet, Take 1 tablet (20 mg total) by mouth daily for 3 days., Disp: 3 tablet, Rfl: 0   hydrochlorothiazide (MICROZIDE) 12.5 MG capsule, Take 1 tablet by mouth as needed., Disp: 30 capsule, Rfl: 1   tretinoin (RETIN-A) 0.025 % cream, Apply 1 application topically at bedtime. (Patient not taking: Reported on 04/04/2021), Disp: , Rfl:    Allergies  Allergen Reactions   Sulfamethoxazole Rash     Review of Systems  Constitutional: Negative.  Negative for chills, fatigue and fever.  HENT:  Negative for congestion and rhinorrhea.   Respiratory: Negative.  Negative for cough, shortness of breath and wheezing.   Cardiovascular:  Positive for leg swelling. Negative for chest pain and palpitations.  Endocrine: Negative for polydipsia, polyphagia and polyuria.  Neurological: Negative.  Negative for weakness, numbness and headaches.  Psychiatric/Behavioral: Negative.      Today's Vitals   04/04/21 0915  BP: 118/64  Pulse: 79  Temp: 98.4 F (36.9 C)  Weight: 138 lb 12.8 oz (63 kg)  Height: _0  (1.575 m)   Body mass index is 25.39 kg/m.   Objective:  Physical Exam Constitutional:  Appearance: Normal appearance.  HENT:     Head: Normocephalic and atraumatic.  Cardiovascular:     Rate and Rhythm: Normal rate and regular rhythm.     Pulses: Normal pulses.     Heart sounds: Normal heart sounds. No murmur heard. Pulmonary:     Effort: Pulmonary effort is normal. No respiratory distress.     Breath sounds: Normal breath sounds. No wheezing.  Musculoskeletal:     Right lower leg: 1+ Pitting Edema present.     Left lower leg: 1+ Pitting Edema present.  Skin:    General: Skin is warm and dry.     Capillary Refill: Capillary refill takes  less than 2 seconds.     Findings: No bruising.  Neurological:     Mental Status: She is alert.        Assessment And Plan:     1. Lower extremity edema - hydrochlorothiazide (MICROZIDE) 12.5 MG capsule; Take 1 tablet by mouth as needed.  Dispense: 30 capsule; Refill: 1 - CMP14+EGFR  -Advised patient to elevate legs as much as possible  -Decrease intake of salt  -Wear stocking hoses -Avoid sitting for long periods of time  -If swelling gets worse, she will call or go to the ER  -Denies leg pain, calf pain, chest pain or SOB  -Follow up with cardiologist   The patient was encouraged to call or send a message through [place] for any questions or concerns.   Follow up: if symptoms persist or do not get better.   Side effects and appropriate use of all the medication(s) were discussed with the patient today. Patient advised to use the medication(s) as directed by their healthcare provider. The patient was encouraged to read, review, and understand all associated package inserts and contact our office with any questions or concerns. The patient accepts the risks of the treatment plan and had an opportunity to ask questions.   Staying healthy and adopting a healthy lifestyle for your overall health is important. You should eat 7 or more servings of fruits and vegetables per day. You should drink plenty of water to keep yourself hydrated and your kidneys healthy. This includes about 65-80+ fluid ounces of water. Limit your intake of animal fats especially for elevated cholesterol. Avoid highly processed food and limit your salt intake if you have hypertension. Avoid foods high in saturated/Trans fats. Along with a healthy diet it is also very important to maintain time for yourself to maintain a healthy mental health with low stress levels. You should get atleast 150 min of moderate intensity exercise weekly for a healthy heart. Along with eating right and exercising, aim for at least 7-9 hours of  sleep daily.  Eat more whole grains which includes barley, wheat berries, oats, brown rice and whole wheat pasta. Use healthy plant oils which include olive, soy, corn, sunflower and peanut. Limit your caffeine and sugary drinks. Limit your intake of fast foods. Limit milk and dairy products to one or two daily servings.   Patient was given opportunity to ask questions. Patient verbalized understanding of the plan and was able to repeat key elements of the plan. All questions were answered to their satisfaction.  Raman Keighley Deckman, DNP   I, Raman Jimmie Rueter have reviewed all documentation for this visit. The documentation on 04/04/21 for the exam, diagnosis, procedures, and orders are all accurate and complete.    IF YOU HAVE BEEN REFERRED TO A SPECIALIST, IT MAY TAKE 1-2 WEEKS TO SCHEDULE/PROCESS THE REFERRAL. IF  YOU HAVE NOT HEARD FROM US/SPECIALIST IN TWO WEEKS, PLEASE GIVE [place] A CALL AT [phone].   THE PATIENT IS ENCOURAGED TO PRACTICE SOCIAL DISTANCING DUE TO THE COVID-19 PANDEMIC.

## 2021-04-05 LAB — CMP14+EGFR
ALT: 22 IU/L (ref 0–32)
AST: 20 IU/L (ref 0–40)
Albumin/Globulin Ratio: 1.7 (ref 1.2–2.2)
Albumin: 4.3 g/dL (ref 3.7–4.7)
Alkaline Phosphatase: 120 IU/L (ref 44–121)
BUN/Creatinine Ratio: 20 (ref 12–28)
BUN: 13 mg/dL (ref 8–27)
Bilirubin Total: 0.4 mg/dL (ref 0.0–1.2)
CO2: 22 mmol/L (ref 20–29)
Calcium: 9.6 mg/dL (ref 8.7–10.3)
Chloride: 103 mmol/L (ref 96–106)
Creatinine, Ser: 0.66 mg/dL (ref 0.57–1.00)
Globulin, Total: 2.5 g/dL (ref 1.5–4.5)
Glucose: 100 mg/dL — ABNORMAL HIGH (ref 70–99)
Potassium: 4.4 mmol/L (ref 3.5–5.2)
Sodium: 140 mmol/L (ref 134–144)
Total Protein: 6.8 g/dL (ref 6.0–8.5)
eGFR: 91 mL/min/{1.73_m2} (ref >59)

## 2021-04-12 DIAGNOSIS — R29898 Other symptoms and signs involving the musculoskeletal system: Secondary | ICD-10-CM | POA: Diagnosis not present

## 2021-04-12 DIAGNOSIS — R221 Localized swelling, mass and lump, neck: Secondary | ICD-10-CM | POA: Diagnosis not present

## 2021-04-12 DIAGNOSIS — H02403 Unspecified ptosis of bilateral eyelids: Secondary | ICD-10-CM | POA: Diagnosis not present

## 2021-04-12 DIAGNOSIS — R600 Localized edema: Secondary | ICD-10-CM | POA: Diagnosis not present

## 2021-04-12 DIAGNOSIS — G729 Myopathy, unspecified: Secondary | ICD-10-CM | POA: Diagnosis not present

## 2021-04-12 DIAGNOSIS — Z882 Allergy status to sulfonamides status: Secondary | ICD-10-CM | POA: Diagnosis not present

## 2021-04-24 ENCOUNTER — Other Ambulatory Visit: Payer: Self-pay | Admitting: Nurse Practitioner

## 2021-04-24 DIAGNOSIS — R600 Localized edema: Secondary | ICD-10-CM

## 2021-04-24 NOTE — Telephone Encounter (Signed)
She needs a lab appt for Tues Nov 15th

## 2021-05-01 ENCOUNTER — Other Ambulatory Visit: Payer: Self-pay

## 2021-05-01 ENCOUNTER — Other Ambulatory Visit: Payer: Medicare Other

## 2021-05-01 ENCOUNTER — Other Ambulatory Visit: Payer: Self-pay | Admitting: Nurse Practitioner

## 2021-05-01 DIAGNOSIS — L659 Nonscarring hair loss, unspecified: Secondary | ICD-10-CM | POA: Diagnosis not present

## 2021-05-01 DIAGNOSIS — R600 Localized edema: Secondary | ICD-10-CM

## 2021-05-02 ENCOUNTER — Other Ambulatory Visit: Payer: Self-pay | Admitting: Nurse Practitioner

## 2021-05-02 DIAGNOSIS — R600 Localized edema: Secondary | ICD-10-CM

## 2021-05-02 DIAGNOSIS — R7989 Other specified abnormal findings of blood chemistry: Secondary | ICD-10-CM

## 2021-05-02 LAB — D-DIMER, QUANTITATIVE: D-DIMER: 0.82 mg/L FEU — ABNORMAL HIGH (ref 0.00–0.49)

## 2021-05-02 LAB — TSH+FREE T4
Free T4: 1.09 ng/dL (ref 0.82–1.77)
TSH: 2.71 u[IU]/mL (ref 0.450–4.500)

## 2021-05-04 ENCOUNTER — Ambulatory Visit (HOSPITAL_COMMUNITY)
Admission: RE | Admit: 2021-05-04 | Discharge: 2021-05-04 | Disposition: A | Discharge status: Home / Self Care | Payer: Medicare Other | Source: Ambulatory Visit | Attending: Nurse Practitioner | Admitting: Nurse Practitioner

## 2021-05-04 ENCOUNTER — Other Ambulatory Visit: Payer: Self-pay

## 2021-05-04 DIAGNOSIS — R7989 Other specified abnormal findings of blood chemistry: Secondary | ICD-10-CM | POA: Insufficient documentation

## 2021-05-04 DIAGNOSIS — R600 Localized edema: Secondary | ICD-10-CM

## 2021-05-07 ENCOUNTER — Encounter: Payer: Self-pay | Admitting: Internal Medicine

## 2021-05-07 DIAGNOSIS — Z79899 Other long term (current) drug therapy: Secondary | ICD-10-CM | POA: Diagnosis not present

## 2021-05-07 DIAGNOSIS — Z23 Encounter for immunization: Secondary | ICD-10-CM | POA: Diagnosis not present

## 2021-05-07 DIAGNOSIS — L650 Telogen effluvium: Secondary | ICD-10-CM | POA: Diagnosis not present

## 2021-05-08 ENCOUNTER — Encounter: Payer: Self-pay | Admitting: Internal Medicine

## 2021-05-08 ENCOUNTER — Other Ambulatory Visit: Payer: Self-pay | Admitting: Nurse Practitioner

## 2021-05-08 DIAGNOSIS — I82890 Acute embolism and thrombosis of other specified veins: Secondary | ICD-10-CM

## 2021-05-25 DIAGNOSIS — M1611 Unilateral primary osteoarthritis, right hip: Secondary | ICD-10-CM | POA: Diagnosis not present

## 2021-06-04 ENCOUNTER — Encounter: Payer: Self-pay | Admitting: Internal Medicine

## 2021-06-06 ENCOUNTER — Telehealth: Payer: Self-pay

## 2021-06-06 NOTE — Telephone Encounter (Signed)
Left the pt a message that the pt needed a lab appt for labs for [facility].

## 2021-06-08 ENCOUNTER — Telehealth: Payer: Self-pay | Admitting: Pulmonary Disease

## 2021-06-08 NOTE — Telephone Encounter (Signed)
Fax received from Dr. Melrose Nakayama to perform a Right anterior hip arthroplasty under spinal anesthesia on patient.  Patient needs surgery clearance. Patient was seen on 12/28/2020. She is scheduled for an OV on 06/19/20 with Katie. Office protocol is a risk assessment can be sent to surgeon if patient has been seen in 60 days or less.   Sending to [employer] for risk assessment

## 2021-06-12 ENCOUNTER — Other Ambulatory Visit: Payer: Self-pay

## 2021-06-12 DIAGNOSIS — Z01812 Encounter for preprocedural laboratory examination: Secondary | ICD-10-CM

## 2021-06-19 ENCOUNTER — Other Ambulatory Visit: Payer: Self-pay

## 2021-06-19 ENCOUNTER — Other Ambulatory Visit: Payer: Medicare Other

## 2021-06-19 ENCOUNTER — Ambulatory Visit (INDEPENDENT_AMBULATORY_CARE_PROVIDER_SITE_OTHER): Payer: Medicare Other | Admitting: Nurse Practitioner

## 2021-06-19 ENCOUNTER — Encounter: Payer: Self-pay | Admitting: Nurse Practitioner

## 2021-06-19 ENCOUNTER — Ambulatory Visit (INDEPENDENT_AMBULATORY_CARE_PROVIDER_SITE_OTHER): Payer: Medicare Other

## 2021-06-19 VITALS — BP 100/60 | HR 80 | Temp 98.2°F | Ht 62.0 in | Wt 146.2 lb

## 2021-06-19 DIAGNOSIS — J479 Bronchiectasis, uncomplicated: Secondary | ICD-10-CM

## 2021-06-19 DIAGNOSIS — Z01812 Encounter for preprocedural laboratory examination: Secondary | ICD-10-CM

## 2021-06-19 DIAGNOSIS — Z01811 Encounter for preprocedural respiratory examination: Secondary | ICD-10-CM | POA: Diagnosis not present

## 2021-06-19 DIAGNOSIS — Z01818 Encounter for other preprocedural examination: Secondary | ICD-10-CM

## 2021-06-19 DIAGNOSIS — Z79899 Other long term (current) drug therapy: Secondary | ICD-10-CM | POA: Diagnosis not present

## 2021-06-19 DIAGNOSIS — R600 Localized edema: Secondary | ICD-10-CM

## 2021-06-19 DIAGNOSIS — R7989 Other specified abnormal findings of blood chemistry: Secondary | ICD-10-CM

## 2021-06-19 DIAGNOSIS — L659 Nonscarring hair loss, unspecified: Secondary | ICD-10-CM

## 2021-06-19 DIAGNOSIS — D649 Anemia, unspecified: Secondary | ICD-10-CM | POA: Diagnosis not present

## 2021-06-19 DIAGNOSIS — R899 Unspecified abnormal finding in specimens from other organs, systems and tissues: Secondary | ICD-10-CM

## 2021-06-19 NOTE — Assessment & Plan Note (Addendum)
Stable. Continue with Breo inhaler daily and mucolytic therapy with PRN mucinex and flutter valve. Advised utilizing flutter after surgery. CXR today. Low moderate risk for surgery given obstructive airway disease, age and bronchiectasis. Aggressive postop pulm toileting. Advised to take inhalers with her to surgery.    1) RISK FOR PROLONGED MECHANICAL VENTILAION - > 48h  1A) Arozullah - Prolonged mech ventilation risk Arozullah Postperative Pulmonary Risk Score - for mech ventilation dependence >48h [employer], Ann Surg 2000, major non-cardiac surgery) Comment Score  Type of surgery - abd ao aneurysm (27), thoracic (21), neurosurgery / upper abdominal / vascular (21), neck (11)  0  Emergency Surgery - (11)  0  ALbumin < 3 or poor nutritional state - (9)  0  BUN > 30 -  (8)  0  Partial or completely dependent functional status - (7)  0  COPD -  (6) Obstructive airway disease 6  Age - 60 to 69 (4), > 70  (6)  6  TOTAL  12  Risk Stratifcation scores  - < 10 (0.5%), 11-19 (1.8%), 20-27 (4.2%), 28-40 (10.1%), >40 (26.6%)     Patient Instructions  -Continue Breo inhaler 1 puff daily, brush tongue and rinse mouth afterwards -Continue Flonase nasal spray 1 spray each nostril daily   Mucinex 600 mg Twice daily as needed for chest congestion/mucous  Flutter valve as needed for mucous clearance. Use 2-3 times a day after surgery.  Risk ameliorating factors are bronchiectasis, obstructive lung disease, age.  Major Pulmonary risks identified in the multifactorial risk analysis are but not limited to a) pneumonia; b) recurrent intubation risk; c) prolonged or recurrent acute respiratory failure needing mechanical ventilation; d) prolonged hospitalization; e) DVT/Pulmonary embolism; f) Acute Pulmonary edema  Recommend 1. Short duration of surgery as much as possible and avoid paralytic if possible 2. DVT prophylaxis 3. Aggressive pulmonary toilet with o2 as needed, bronchodilatation, and  incentive spirometry and early ambulation  I hope your surgery goes well! Please let us know if you need anything afterwards.  Chest x ray today for preoperative clearance.   Follow up with Dr. Elsworth Soho as previously scheduled. If symptoms do not improve or worsen, please contact office for sooner follow up or seek emergency care.

## 2021-06-19 NOTE — Addendum Note (Signed)
Addended by: Roxine Caddy on: 06/19/2021 04:48 PM   Modules accepted: Orders

## 2021-06-19 NOTE — Progress Notes (Addendum)
_0  ID: Tracey Castro, female    DOB: March 14, 1946, 76 y.o.   MRN: [id number]  Chief Complaint  Patient presents with   Follow-up    Surgical clearance     Referring provider: Glendale Chard, MD  HPI: 76 year old female, never smoker followed for bronchiectasis, chronic cough, and obstruction on spirometry suspected to be r/t bronchiectasis.  She is a patient of Dr. Bari Mantis and was last seen in office on 12/28/2020.  Past medical history significant for rhinitis, HLD.   TEST/EVENTS:  02/03/2018 CT chest: Focal dense chronic appearing consolidations within the lingula and right middle lobe, with associated traction bronchiectasis 02/19/2018: Quant TB negative 03/18/2018: Quant Ig's nl, A1AT level 128, MM 03/18/2018: allergen profile Eos 100, IgE 4 RAST neg 03/18/2018 Spirometry: FEV1 1 (49), ratio 70 03/27/2018 FOB: minimal secretions, BAL lingula negative cytology, AFB and fungus.  05/08/2018 PFTs: FEV1 1.3 (65), ratio 65, 14% improvement, DLCO 67% corrects to 91% for alveolar volume 09/04/2018 spirometry: Worsening airway obstruction with ratio 69, FEV1 52%, FVC 57% 05/2020 PFTs: Ratio 72, FEV1 64%, FVC 67%, TLC normal, DLCO normal 11/2020 CT chest with contrast: Bronchiectasis and scarring in right middle lobe and lingula, stable  06/19/2021: Today - surgical clearance Patient presents today for surgical clearance prior to right hip replacement on 1/17 with Dr. Norm Salt. She is planning to have this done at the outpatient surgery center with spinal anesthesia. Her breathing has been stable and shortness of breath upon exertion unchanged from baseline. She reports an occasional cough with clear sputum production, unchanged from baseline as well. She denies wheezing, lower extremity swelling, chest pain, PND or orthopnea. She continues on her Breo inhaler and flonase daily. Overall, she feels well and is looking forward to her surgery.   Allergies  Allergen Reactions   Sulfamethoxazole  Rash    Immunization History  Administered Date(s) Administered   Fluad Quad(high Dose 65+) 03/18/2019   Influenza, High Dose Seasonal PF 06/03/2018   Influenza-Unspecified 04/17/2013   PFIZER(Purple Top)SARS-COV-2 Vaccination 07/08/2019, 07/29/2019, 04/18/2020   Pneumococcal-Unspecified 03/28/2015   Tdap 08/28/2020    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([place])     Tobacco History: Social History   Tobacco Use  Smoking Status Never  Smokeless Tobacco Never   Counseling given: Not Answered   Outpatient Medications Prior to Visit  Medication Sig Dispense Refill   Cholecalciferol (VITAMIN D) 2000 units tablet Take 4,000-6,000 Units by mouth daily.     diphenhydrAMINE HCl, Sleep, 25 MG CAPS Take 25 mg by mouth daily as needed (sleep).     Ferrous Gluconate-C-Folic Acid (IRON-C PO) Take 65 mg by mouth.     fluticasone (FLONASE) 50 MCG/ACT nasal spray Place 1 spray into both nostrils daily as needed for allergies.     fluticasone furoate-vilanterol (BREO ELLIPTA) 100-25 MCG/INH AEPB Inhale 1 puff into the lungs daily. USE 1 INHALATION DAILY 180 each 1   hydrochlorothiazide (MICROZIDE) 12.5 MG capsule Take 1 tablet by mouth as needed. 30 capsule 1   ibuprofen (ADVIL,MOTRIN) 200 MG tablet Take 600 mg by mouth 2 (two) times daily as needed for headache or moderate pain.     Magnesium 500 MG CAPS Take 500 mg by mouth daily.     Multiple Vitamin (MULTIVITAMIN) capsule Take 1 capsule by mouth daily.     tretinoin (RETIN-A) 0.025 % cream Apply 1 application topically at bedtime.     furosemide (LASIX) 20 MG tablet Take 1 tablet (  20 mg total) by mouth daily for 3 days. 3 tablet 0   No facility-administered medications prior to visit.     Review of Systems:   Constitutional: No weight loss or gain, night sweats, fevers, chills, fatigue, or lassitude. HEENT: No headaches, difficulty swallowing, tooth/dental problems, or sore throat.  No sneezing, itching, ear ache, nasal congestion, or post nasal drip CV:  No chest pain, orthopnea, PND, swelling in lower extremities, anasarca, dizziness, palpitations, syncope Resp: +shortness of breath with exertion (mild;unchanged from baseline); occasional productive cough with clear sputum. No excess mucus or change in color of mucus. No hemoptysis. No wheezing.  No chest wall deformity GI:  No heartburn, indigestion, abdominal pain, nausea, vomiting, diarrhea, change in bowel habits, loss of appetite, bloody stools.  GU: No dysuria, change in color of urine, urgency or frequency.  No flank pain, no hematuria  Skin: No rash, lesions, ulcerations MSK:  No joint pain or swelling.  No decreased range of motion.  No back pain. Neuro: No dizziness or lightheadedness.  Psych: No depression or anxiety. Mood stable.     Physical Exam:  BP 100/60 (BP Location: Left Arm, Cuff Size: Normal)    Pulse 80    Temp 98.2 F (36.8 C) (Oral)    Ht _0  (1.575 m)    Wt 146 lb 3.2 oz (66.3 kg)    SpO2 97%    BMI 26.74 kg/m   GEN: Pleasant, interactive, well-nourished, well-appearing; in no acute distress. HEENT:  Normocephalic and atraumatic. EACs patent bilaterally. TM pearly gray with present light reflex bilaterally. PERRLA. Sclera white. Nasal turbinates pink, moist and patent bilaterally. No rhinorrhea present. Oropharynx pink and moist, without exudate or edema. No lesions, ulcerations, or postnasal drip.  NECK:  Supple w/ fair ROM. No JVD present. Normal carotid impulses w/o bruits. Thyroid symmetrical with no goiter or nodules palpated. No lymphadenopathy.   CV: RRR, no m/r/g, no peripheral edema. Pulses intact, +2 bilaterally. No cyanosis, pallor or clubbing. PULMONARY:  Unlabored, regular breathing. Clear bilaterally A&P w/o wheezes/rales/rhonchi. No accessory muscle use. No dullness to percussion. GI: BS present and normoactive. Soft, non-tender to palpation. No organomegaly or masses detected.  No CVA tenderness. MSK: No erythema, warmth or tenderness. Cap refil <2 sec all extrem. No deformities or joint swelling noted.  Neuro: A/Ox3. No focal deficits noted.   Skin: Warm, no lesions or rashe Psych: Normal affect and behavior. Judgement and thought content appropriate.     Lab Results:  CBC    Component Value Date/Time   WBC 6.1 06/19/2021 0853   WBC 9.3 12/15/2020 1231   RBC 4.77 06/19/2021 0853   RBC 3.68 (L) 12/15/2020 1231   HGB 12.6 06/19/2021 0853   HCT 39.6 06/19/2021 0853   PLT 425 06/19/2021 0853   MCV 83 06/19/2021 0853   MCH 26.4 (L) 06/19/2021 0853   MCH 29.1 12/15/2020 1231   MCHC 31.8 06/19/2021 0853   MCHC 32.4 12/15/2020 1231   RDW 15.5 (H) 06/19/2021 0853   LYMPHSABS 2.1 06/19/2021 0853   MONOABS 0.9 12/15/2020 1231   EOSABS 0.1 06/19/2021 0853   BASOSABS 0.1 06/19/2021 0853    BMET    Component Value Date/Time   NA 138 06/19/2021 0853   K 4.5 06/19/2021 0853   CL 101 06/19/2021 0853   CO2 25 06/19/2021 0853   GLUCOSE 109 (H) 06/19/2021 0853   GLUCOSE 105 (H) 12/15/2020 1231   BUN 14 06/19/2021 0853   CREATININE 0.71 06/19/2021 0853  CALCIUM 9.7 06/19/2021 0853   GFRNONAA >60 12/15/2020 1231   GFRAA 98 06/20/2020 0922    BNP    Component Value Date/Time   BNP 133.6 (H) 12/15/2020 1231     Imaging:  06/19/2021: CXR reviewed by me. Focal scarring and bronchiectasis in the lingula and right middle lobe, unchanged. No acute process noted.   DG Chest 2 View  Result Date: 06/19/2021 CLINICAL DATA:  Surgical clearance EXAM: CHEST - 2 VIEW COMPARISON:  Prior chest x-ray 12/15/2020; prior chest CT 12/01/2020 FINDINGS: Stable cardiac and mediastinal contours. No focal airspace opacification, pulmonary edema, pleural effusion or pneumothorax. Focal scarring and bronchiectasis in the lingula and right middle lobe again noted without interval change. No acute osseous abnormality. IMPRESSION: Stable chest x-ray without evidence of acute  cardiopulmonary process. Electronically Signed   By: Jacqulynn Cadet M.D.   On: 06/19/2021 16:30      PFT Results Latest Ref Rng & Units 06/13/2020 05/08/2018  FVC-Pre L 1.74 1.74  FVC-Predicted Pre % 67 65  FVC-Post L 1.77 1.78  FVC-Predicted Post % 68 67  Pre FEV1/FVC % % 72 65  Post FEV1/FCV % % 73 73  FEV1-Pre L 1.25 1.13  FEV1-Predicted Pre % 64 57  FEV1-Post L 1.30 1.30  DLCO uncorrected ml/min/mmHg 15.83 14.62  DLCO UNC% % 88 67  DLCO corrected ml/min/mmHg 15.83 14.45  DLCO COR %Predicted % 88 67  DLVA Predicted % 102 91  TLC L 4.35 3.90  TLC % Predicted % 91 82  RV % Predicted % 101 99    No results found for: NITRICOXIDE      Assessment & Plan:   Bronchiectasis without complication (HCC) Stable. Continue with Breo inhaler daily and mucolytic therapy with PRN mucinex and flutter valve. Advised utilizing flutter after surgery. CXR today. Low moderate risk for surgery given obstructive airway disease, age and bronchiectasis. Aggressive postop pulm toileting. Advised to take inhalers with her to surgery.    1) RISK FOR PROLONGED MECHANICAL VENTILAION - > 48h  1A) Arozullah - Prolonged mech ventilation risk Arozullah Postperative Pulmonary Risk Score - for mech ventilation dependence >48h [employer], Ann Surg 2000, major non-cardiac surgery) Comment Score  Type of surgery - abd ao aneurysm (27), thoracic (21), neurosurgery / upper abdominal / vascular (21), neck (11)  0  Emergency Surgery - (11)  0  ALbumin < 3 or poor nutritional state - (9)  0  BUN > 30 -  (8)  0  Partial or completely dependent functional status - (7)  0  COPD -  (6) Obstructive airway disease 6  Age - 60 to 69 (4), > 70  (6)  6  TOTAL  12  Risk Stratifcation scores  - < 10 (0.5%), 11-19 (1.8%), 20-27 (4.2%), 28-40 (10.1%), >40 (26.6%)     Patient Instructions  -Continue Breo inhaler 1 puff daily, brush tongue and rinse mouth afterwards -Continue Flonase nasal spray 1 spray each  nostril daily   Mucinex 600 mg Twice daily as needed for chest congestion/mucous  Flutter valve as needed for mucous clearance. Use 2-3 times a day after surgery.  Risk ameliorating factors are bronchiectasis, obstructive lung disease, age.  Major Pulmonary risks identified in the multifactorial risk analysis are but not limited to a) pneumonia; b) recurrent intubation risk; c) prolonged or recurrent acute respiratory failure needing mechanical ventilation; d) prolonged hospitalization; e) DVT/Pulmonary embolism; f) Acute Pulmonary edema  Recommend 1. Short duration of surgery as much as possible and avoid  paralytic if possible 2. DVT prophylaxis 3. Aggressive pulmonary toilet with o2 as needed, bronchodilatation, and incentive spirometry and early ambulation  I hope your surgery goes well! Please let us know if you need anything afterwards.  Chest x ray today for preoperative clearance.   Follow up with Dr. Elsworth Soho as previously scheduled. If symptoms do not improve or worsen, please contact office for sooner follow up or seek emergency care.   Preoperative respiratory examination Low moderate risk. See above.    Clayton Bibles, NP 06/20/2021  Pt aware and understands NP's role.

## 2021-06-19 NOTE — Assessment & Plan Note (Signed)
Low moderate risk. See above.

## 2021-06-19 NOTE — Patient Instructions (Addendum)
-  Continue Breo inhaler 1 puff daily, brush tongue and rinse mouth afterwards -Continue Flonase nasal spray 1 spray each nostril daily   Mucinex 600 mg Twice daily as needed for chest congestion/mucous  Flutter valve as needed for mucous clearance. Use 2-3 times a day after surgery.  Risk ameliorating factors are bronchiectasis, obstructive lung disease, age.  Major Pulmonary risks identified in the multifactorial risk analysis are but not limited to a) pneumonia; b) recurrent intubation risk; c) prolonged or recurrent acute respiratory failure needing mechanical ventilation; d) prolonged hospitalization; e) DVT/Pulmonary embolism; f) Acute Pulmonary edema  Recommend 1. Short duration of surgery as much as possible and avoid paralytic if possible 2. DVT prophylaxis 3. Aggressive pulmonary toilet with o2 as needed, bronchodilatation, and incentive spirometry and early ambulation  I hope your surgery goes well! Please let us know if you need anything afterwards.  Chest x ray today for preoperative clearance.   Follow up with Dr. Elsworth Soho as previously scheduled. If symptoms do not improve or worsen, please contact office for sooner follow up or seek emergency care.

## 2021-06-20 ENCOUNTER — Encounter: Payer: Self-pay | Admitting: Internal Medicine

## 2021-06-20 LAB — CBC WITH DIFFERENTIAL/PLATELET
Basophils Absolute: 0.1 10*3/uL (ref 0.0–0.2)
Basos: 1 %
EOS (ABSOLUTE): 0.1 10*3/uL (ref 0.0–0.4)
Eos: 2 %
Hematocrit: 39.6 % (ref 34.0–46.6)
Hemoglobin: 12.6 g/dL (ref 11.1–15.9)
Immature Grans (Abs): 0 10*3/uL (ref 0.0–0.1)
Immature Granulocytes: 0 %
Lymphocytes Absolute: 2.1 10*3/uL (ref 0.7–3.1)
Lymphs: 35 %
MCH: 26.4 pg — ABNORMAL LOW (ref 26.6–33.0)
MCHC: 31.8 g/dL (ref 31.5–35.7)
MCV: 83 fL (ref 79–97)
Monocytes Absolute: 0.6 10*3/uL (ref 0.1–0.9)
Monocytes: 10 %
Neutrophils Absolute: 3.2 10*3/uL (ref 1.4–7.0)
Neutrophils: 52 %
Platelets: 425 10*3/uL (ref 150–450)
RBC: 4.77 x10E6/uL (ref 3.77–5.28)
RDW: 15.5 % — ABNORMAL HIGH (ref 11.7–15.4)
WBC: 6.1 10*3/uL (ref 3.4–10.8)

## 2021-06-20 LAB — CMP14+EGFR
ALT: 21 IU/L (ref 0–32)
AST: 22 IU/L (ref 0–40)
Albumin/Globulin Ratio: 2 (ref 1.2–2.2)
Albumin: 4.3 g/dL (ref 3.7–4.7)
Alkaline Phosphatase: 118 IU/L (ref 44–121)
BUN/Creatinine Ratio: 20 (ref 12–28)
BUN: 14 mg/dL (ref 8–27)
Bilirubin Total: 0.6 mg/dL (ref 0.0–1.2)
CO2: 25 mmol/L (ref 20–29)
Calcium: 9.7 mg/dL (ref 8.7–10.3)
Chloride: 101 mmol/L (ref 96–106)
Creatinine, Ser: 0.71 mg/dL (ref 0.57–1.00)
Globulin, Total: 2.1 g/dL (ref 1.5–4.5)
Glucose: 109 mg/dL — ABNORMAL HIGH (ref 70–99)
Potassium: 4.5 mmol/L (ref 3.5–5.2)
Sodium: 138 mmol/L (ref 134–144)
Total Protein: 6.4 g/dL (ref 6.0–8.5)
eGFR: 88 mL/min/{1.73_m2} (ref >59)

## 2021-06-20 LAB — IRON AND TIBC
Iron Saturation: 16 % (ref 15–55)
Iron: 46 ug/dL (ref 27–139)
Total Iron Binding Capacity: 283 ug/dL (ref 250–450)
UIBC: 237 ug/dL (ref 118–369)

## 2021-06-20 LAB — HEMOGLOBIN A1C
Est. average glucose Bld gHb Est-mCnc: 134 mg/dL
Hgb A1c MFr Bld: 6.3 % — ABNORMAL HIGH (ref 4.8–5.6)

## 2021-06-20 LAB — PROTIME-INR
INR: 1 (ref 0.9–1.2)
Prothrombin Time: 10.1 s (ref 9.1–12.0)

## 2021-06-20 NOTE — Addendum Note (Signed)
Addended by: Melba Coon on: 06/20/2021 02:22 PM   Modules accepted: Level of Service

## 2021-06-21 ENCOUNTER — Encounter: Payer: Self-pay | Admitting: Internal Medicine

## 2021-06-21 NOTE — Telephone Encounter (Signed)
Ov notes and clearance form have been faxed back to [facility]. Nothing further needed at this time.

## 2021-06-25 DIAGNOSIS — M1611 Unilateral primary osteoarthritis, right hip: Secondary | ICD-10-CM | POA: Diagnosis not present

## 2021-06-27 DIAGNOSIS — L650 Telogen effluvium: Secondary | ICD-10-CM | POA: Diagnosis not present

## 2021-06-28 ENCOUNTER — Ambulatory Visit (INDEPENDENT_AMBULATORY_CARE_PROVIDER_SITE_OTHER): Payer: Medicare Other

## 2021-06-28 VITALS — Ht 62.0 in | Wt 142.5 lb

## 2021-06-28 DIAGNOSIS — Z0000 Encounter for general adult medical examination without abnormal findings: Secondary | ICD-10-CM | POA: Diagnosis not present

## 2021-06-28 NOTE — Progress Notes (Signed)
I connected with  Tracey Castro today via telehealth video enabled device and verified that I am speaking with the correct person using two identifiers.   Location: Patient: home Provider: work  Persons participating in virtual visit: Tracey Castro, Tracey Castro  I discussed the limitations, risks, security and privacy concerns of performing an evaluation and management service by video  and the availability of in person appointments. The patient expressed understanding and agreed to proceed.   Some vital signs may be absent or patient reported.     Subjective:   Tracey Castro is a 76 y.o. female who presents for Medicare Annual (Subsequent) preventive examination.  Review of Systems     Cardiac Risk Factors include: advanced age (>20mn, >>65women)     Objective:    Today's Vitals   06/28/21 0940  Weight: 142 lb 8 oz (64.6 kg)  Height: _0  (1.575 m)   Body mass index is 26.06 kg/m.  Advanced Directives 06/28/2021 08/28/2020 06/21/2020 06/09/2019 06/03/2018 03/27/2018  Does Patient Have a Medical Advance Directive? _1  Yes  Type of [place]amed[place]of [place] will Healthcare Power of [place] will [place] will [place] will Living will  Does patient want to make changes to medical advance directive? - No - Patient declined - - No - Patient declined -  Copy of [place] Chart? No - copy requested No - copy requested No - copy requested No - copy requested No - copy requested -    Current Medications (verified) Outpatient Encounter Medications as of 06/28/2021  Medication Sig   Cholecalciferol (VITAMIN D) 2000 units tablet Take 4,000-6,000 Units by mouth daily.   diphenhydrAMINE HCl, Sleep, 25 MG CAPS Take 25 mg by mouth daily as needed (sleep).   Ferrous Gluconate-C-Folic Acid (IRON-C PO) Take 65  mg by mouth.   fluticasone (FLONASE) 50 MCG/ACT nasal spray Place 1 spray into both nostrils daily as needed for allergies.   fluticasone furoate-vilanterol (BREO ELLIPTA) 100-25 MCG/INH AEPB Inhale 1 puff into the lungs daily. USE 1 INHALATION DAILY   hydrochlorothiazide (MICROZIDE) 12.5 MG capsule Take 1 tablet by mouth as needed.   ibuprofen (ADVIL,MOTRIN) 200 MG tablet Take 600 mg by mouth 2 (two) times daily as needed for headache or moderate pain.   Magnesium 500 MG CAPS Take 500 mg by mouth daily.   Multiple Vitamin (MULTIVITAMIN) capsule Take 1 capsule by mouth daily.   tretinoin (RETIN-A) 0.025 % cream Apply 1 application topically at bedtime.   furosemide (LASIX) 20 MG tablet Take 1 tablet (20 mg total) by mouth daily for 3 days.   No facility-administered encounter medications on file as of 06/28/2021.    Allergies (verified) Sulfamethoxazole   History: 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([facility]    Past Surgical History:  Procedure Laterality Date   BREAST CYST ASPIRATION     INCISION AND DRAINAGE Left 08/28/2020   Procedure: INCISION AND DRAINAGE OF LEFT KNEE LACERATION;  Surgeon: DMeredith Pel MD;  Location: [facility];  Service: Orthopedics;  Laterality: Left;   VIDEO BRONCHOSCOPY Bilateral 03/27/2018   Procedure: VIDEO BRONCHOSCOPY WITHOUT FLUORO;  Surgeon: WTanda Rockers MD;  [place]  Service: Cardiopulmonary;  Laterality: Bilateral;   Family History  Problem Relation Age of Onset   Cancer - Ovarian Mother    Cancer - Colon Father  Parkinson's disease Father    Social History   Socioeconomic History   Marital status: Married    Spouse name: Not on file   Number of children: Not on file   Years of education: Not on file   Highest education level: Not on file  Occupational History   Occupation: retired  Tobacco Use   Smoking status: Never   Smokeless tobacco: Never  Vaping Use    Vaping Use: Never used  Substance and Sexual Activity   Alcohol use: Yes    Alcohol/week: 4.0 standard drinks    Types: 4 Glasses of wine per week   Drug use: Never   Sexual activity: Yes  Other Topics Concern   Not on file  Social History Narrative   ** Merged History Encounter **       Social Determinants of Health   Financial Resource Strain: Low Risk    Difficulty of Paying Living Expenses: Not hard at all  Food Insecurity: No Food Insecurity   Worried About Charity fundraiser in the Last Year: Never true   [place] in the Last Year: Never true  Transportation Needs: No Transportation Needs   Lack of Transportation (Medical): No   Lack of Transportation (Non-Medical): No  Physical Activity: Insufficiently Active   Days of Exercise per Week: 2 days   Minutes of Exercise per Session: 50 min  Stress: No Stress Concern Present   Feeling of Stress : Not at all  Social Connections: Not on file    Tobacco Counseling Counseling given: Not Answered   Clinical Intake:  Pre-visit preparation completed: Yes  Pain : No/denies pain     Nutritional Status: BMI 25 -29 Overweight Nutritional Risks: None Diabetes: No  How often do you need to have someone help you when you read instructions, pamphlets, or other written materials from your doctor or pharmacy?: 1 - Never What is the last grade level you completed in school?: master degree  Diabetic? no  Interpreter Needed?: No  Information entered by :: Tracey Castro   Activities of Daily Living In your present state of health, do you have any difficulty performing the following activities: 06/28/2021 08/28/2020  Hearing? N Y  Vision? N N  Difficulty concentrating or making decisions? N N  Walking or climbing stairs? N N  Dressing or bathing? N N  Doing errands, shopping? N N  Preparing Food and eating ? N -  Using the Toilet? N -  In the past six months, have you accidently leaked urine? Y -  Comment with  laughing -  Do you have problems with loss of bowel control? N -  Managing your Medications? N -  Managing your Finances? N -  Housekeeping or managing your Housekeeping? N -  Some recent data might be hidden    Patient Care Team: Glendale Chard, MD as PCP - General (Internal Medicine)  Indicate any recent Medical Services you may have received from other than [facility] providers in the past year (date may be approximate).     Assessment:   This is a routine wellness examination for Tracey Castro.  Hearing/Vision screen Vision Screening - Comments:: Regular eye exams,   Dietary issues and exercise activities discussed: Current Exercise Habits: Structured exercise class, Type of exercise: yoga, Time (Minutes): 50, Frequency (Times/Week): 2, Weekly Exercise (Minutes/Week): 100   Goals Addressed             This Visit's Progress    Patient Stated  06/28/2021, get through surgery and get legs stronger       Depression Screen PHQ 2/9 Scores 06/28/2021 06/21/2020 06/19/2020 06/09/2019 11/10/2018 06/03/2018 06/03/2018  PHQ - 2 Score 0 0 0 0 0 0 0  PHQ- 9 Score - - - 3 - - -    Fall Risk Fall Risk  06/28/2021 06/21/2020 06/19/2020 06/09/2019 11/10/2018  Falls in the past year? 0 0 0 0 0  Comment - - - - -  Number falls in past yr: - - 0 - -  Injury with Fall? - - 0 - -  Risk for fall due to : Medication side effect No Fall Risks - - -  Follow up Falls evaluation completed;Education provided;Falls prevention discussed Falls evaluation completed;Education provided;Falls prevention discussed - Falls evaluation completed;Education provided;Falls prevention discussed -    FALL RISK PREVENTION PERTAINING TO THE HOME:  Any stairs in or around the home? Yes  If so, are there any without handrails? No  Home free of loose throw rugs in walkways, pet beds, electrical cords, etc? Yes  Adequate lighting in your home to reduce risk of falls? Yes   ASSISTIVE DEVICES UTILIZED TO PREVENT FALLS:  Life  alert? No  Use of a cane, walker or w/c? No  Grab bars in the bathroom? Yes  Shower chair or bench in shower? Yes  Elevated toilet seat or a handicapped toilet? Yes   TIMED UP AND GO:  Was the test performed? No .      Cognitive Function:     6CIT Screen 06/28/2021 06/21/2020 06/09/2019  What Year? 0 points 0 points 0 points  What month? 0 points 0 points 0 points  What time? 0 points 0 points 0 points  Count back from 20 0 points 0 points 0 points  Months in reverse 0 points 0 points 0 points  Repeat phrase 0 points 2 points 0 points  Total Score 0 2 0    Immunizations Immunization History  Administered Date(s) Administered   Fluad Quad(high Dose 65+) 03/18/2019   Influenza, High Dose Seasonal PF 06/03/2018   Influenza-Unspecified 04/17/2013   PFIZER(Purple Top)SARS-COV-2 Vaccination 07/08/2019, 07/29/2019, 04/18/2020   Pneumococcal-Unspecified 03/28/2015   Tdap 08/28/2020    TDAP status: Up to date  Flu Vaccine status: Up to date  Pneumococcal vaccine status: Up to date  Covid-19 vaccine status: Completed vaccines  Qualifies for Shingles Vaccine? Yes   Zostavax completed No   Shingrix Completed?: No.    Education has been provided regarding the importance of this vaccine. Patient has been advised to call insurance company to determine out of pocket expense if they have not yet received this vaccine. Advised may also receive vaccine at local pharmacy or Health Dept. Verbalized acceptance and understanding.  Screening Tests Health Maintenance  Topic Date Due   Pneumonia Vaccine 66+ Years old (1 - PCV) 12/23/1951   Zoster Vaccines- Shingrix (1 of 2) Never done   COVID-19 Vaccine (4 - Booster for Pfizer series) 06/13/2020   INFLUENZA VACCINE  01/15/2021   TETANUS/TDAP  08/29/2030   DEXA SCAN  Completed   Hepatitis C Screening  Completed   HPV VACCINES  Aged Out   COLONOSCOPY (Pts 45-6yr Insurance coverage will need to be confirmed)  Discontinued    Health  Maintenance  Health Maintenance Due  Topic Date Due   Pneumonia Vaccine 77 Years old (1 - PCV) 12/23/1951   Zoster Vaccines- Shingrix (1 of 2) Never done   COVID-19 Vaccine (4 - Booster for  Pfizer series) 06/13/2020   INFLUENZA VACCINE  01/15/2021    Colorectal cancer screening: No longer required.   Mammogram status: Completed 11/20/2020. Repeat every year  Bone Density status: Completed 06/13/2017.   Lung Cancer Screening: (Low Dose CT Chest recommended if Age 12-80 years, 30 pack-year currently smoking OR have quit w/in 15years.) does not qualify.   Lung Cancer Screening Referral: no  Additional Screening:  Hepatitis C Screening: does qualify; Completed 06/04/2018  Vision Screening: Recommended annual ophthalmology exams for early detection of glaucoma and other disorders of the eye. Is the patient up to date with their annual eye exam?  Yes  Who is the provider or what is the name of the office in which the patient attends annual eye exams?  If pt is not established with a provider, would they like to be referred to a provider to establish care? No .   Dental Screening: Recommended annual dental exams for proper oral hygiene  Community Resource Referral / Chronic Care Management: CRR required this visit?  No   CCM required this visit?  No      Plan:     I have personally reviewed and noted the following in the patients chart:   Medical and social history Use of alcohol, tobacco or illicit drugs  Current medications and supplements including opioid prescriptions.  Functional ability and status Nutritional status Physical activity Advanced directives List of other physicians Hospitalizations, surgeries, and ER visits in previous 12 months Vitals Screenings to include cognitive, depression, and falls Referrals and appointments  In addition, I have reviewed and discussed with patient certain preventive protocols, quality metrics, and best practice  recommendations. A written personalized care plan for preventive services as well as general preventive health recommendations were provided to patient.     Kellie Simmering, Castro   01/16/6552   Nurse Notes: none

## 2021-06-28 NOTE — Patient Instructions (Signed)
[name] , Thank you for taking time to come for your Medicare Wellness Visit. I appreciate your ongoing commitment to your health goals. Please review the following plan we discussed and let me know if I can assist you in the future.   Screening recommendations/referrals: Colonoscopy: not required Mammogram: completed 11/20/2020 Bone Density: completed 06/13/2017 Recommended yearly ophthalmology/optometry visit for glaucoma screening and checkup Recommended yearly dental visit for hygiene and checkup  Vaccinations: Influenza vaccine: completed 05/07/2021 Pneumococcal vaccine: due Tdap vaccine: 08/28/2020, due 08/29/2030 Shingles vaccine: discussed   Covid-19: 03/23/2021, 04/18/2020, 07/29/2019, 07/08/2019  Advanced directives: Please bring a copy of your POA (Power of Attorney) and/or Living Will to your next appointment.   Conditions/risks identified: none  Next appointment: Follow up in one year for your annual wellness visit    Preventive Care 65 Years and Older, Female Preventive care refers to lifestyle choices and visits with your health care provider that can promote health and wellness. What does preventive care include? A yearly physical exam. This is also called an annual well check. Dental exams once or twice a year. Routine eye exams. Ask your health care provider how often you should have your eyes checked. Personal lifestyle choices, including: Daily care of your teeth and gums. Regular physical activity. Eating a healthy diet. Avoiding tobacco and drug use. Limiting alcohol use. Practicing safe sex. Taking low-dose aspirin every day. Taking vitamin and mineral supplements as recommended by your health care provider. What happens during an annual well check? The services and screenings done by your health care provider during your annual well check will depend on your age, overall health, lifestyle risk factors, and family history of disease. Counseling   Your health care provider may ask you questions about your: Alcohol use. Tobacco use. Drug use. Emotional well-being. Home and relationship well-being. Sexual activity. Eating habits. History of falls. Memory and ability to understand (cognition). Work and work Statistician. Reproductive health. Screening  You may have the following tests or measurements: Height, weight, and BMI. Blood pressure. Lipid and cholesterol levels. These may be checked every 5 years, or more frequently if you are over 42 years old. Skin check. Lung cancer screening. You may have this screening every year starting at age 39 if you have a 30-pack-year history of smoking and currently smoke or have quit within the past 15 years. Fecal occult blood test (FOBT) of the stool. You may have this test every year starting at age 74. Flexible sigmoidoscopy or colonoscopy. You may have a sigmoidoscopy every 5 years or a colonoscopy every 10 years starting at age 72. Hepatitis C blood test. Hepatitis B blood test. Sexually transmitted disease (STD) testing. Diabetes screening. This is done by checking your blood sugar (glucose) after you have not eaten for a while (fasting). You may have this done every 1-3 years. Bone density scan. This is done to screen for osteoporosis. You may have this done starting at age 46. Mammogram. This may be done every 1-2 years. Talk to your health care provider about how often you should have regular mammograms. Talk with your health care provider about your test results, treatment options, and if necessary, the need for more tests. Vaccines  Your health care provider may recommend certain vaccines, such as: Influenza vaccine. This is recommended every year. Tetanus, diphtheria, and acellular pertussis (Tdap, Td) vaccine. You may need a Td booster every 10 years. Zoster vaccine. You may need this after age 67. Pneumococcal 13-valent conjugate (PCV13) vaccine. One dose is  recommended  after age 20. Pneumococcal polysaccharide (PPSV23) vaccine. One dose is recommended after age 57. Talk to your health care provider about which screenings and vaccines you need and how often you need them. This information is not intended to replace advice given to you by your health care provider. Make sure you discuss any questions you have with your health care provider. Document Released: 06/30/2015 Document Revised: 02/21/2016 Document Reviewed: 04/04/2015 Elsevier Interactive Patient Education  2017 [employer] Prevention in the Home Falls can cause injuries. They can happen to people of all ages. There are many things you can do to make your home safe and to help prevent falls. What can I do on the outside of my home? Regularly fix the edges of walkways and driveways and fix any cracks. Remove anything that might make you trip as you walk through a door, such as a raised step or threshold. Trim any bushes or trees on the path to your home. Use bright outdoor lighting. Clear any walking paths of anything that might make someone trip, such as rocks or tools. Regularly check to see if handrails are loose or broken. Make sure that both sides of any steps have handrails. Any raised decks and porches should have guardrails on the edges. Have any leaves, snow, or ice cleared regularly. Use sand or salt on walking paths during winter. Clean up any spills in your garage right away. This includes oil or grease spills. What can I do in the bathroom? Use night lights. Install grab bars by the toilet and in the tub and shower. Do not use towel bars as grab bars. Use non-skid mats or decals in the tub or shower. If you need to sit down in the shower, use a plastic, non-slip stool. Keep the floor dry. Clean up any water that spills on the floor as soon as it happens. Remove soap buildup in the tub or shower regularly. Attach bath mats securely with double-sided non-slip rug tape. Do not  have throw rugs and other things on the floor that can make you trip. What can I do in the bedroom? Use night lights. Make sure that you have a light by your bed that is easy to reach. Do not use any sheets or blankets that are too big for your bed. They should not hang down onto the floor. Have a firm chair that has side arms. You can use this for support while you get dressed. Do not have throw rugs and other things on the floor that can make you trip. What can I do in the kitchen? Clean up any spills right away. Avoid walking on wet floors. Keep items that you use a lot in easy-to-reach places. If you need to reach something above you, use a strong step stool that has a grab bar. Keep electrical cords out of the way. Do not use floor polish or wax that makes floors slippery. If you must use wax, use non-skid floor wax. Do not have throw rugs and other things on the floor that can make you trip. What can I do with my stairs? Do not leave any items on the stairs. Make sure that there are handrails on both sides of the stairs and use them. Fix handrails that are broken or loose. Make sure that handrails are as long as the stairways. Check any carpeting to make sure that it is firmly attached to the stairs. Fix any carpet that is loose or worn. Avoid having  throw rugs at the top or bottom of the stairs. If you do have throw rugs, attach them to the floor with carpet tape. Make sure that you have a light switch at the top of the stairs and the bottom of the stairs. If you do not have them, ask someone to add them for you. What else can I do to help prevent falls? Wear shoes that: Do not have high heels. Have rubber bottoms. Are comfortable and fit you well. Are closed at the toe. Do not wear sandals. If you use a stepladder: Make sure that it is fully opened. Do not climb a closed stepladder. Make sure that both sides of the stepladder are locked into place. Ask someone to hold it for  you, if possible. Clearly mark and make sure that you can see: Any grab bars or handrails. First and last steps. Where the edge of each step is. Use tools that help you move around (mobility aids) if they are needed. These include: Canes. Walkers. Scooters. Crutches. Turn on the lights when you go into a dark area. Replace any light bulbs as soon as they burn out. Set up your furniture so you have a clear path. Avoid moving your furniture around. If any of your floors are uneven, fix them. If there are any pets around you, be aware of where they are. Review your medicines with your doctor. Some medicines can make you feel dizzy. This can increase your chance of falling. Ask your doctor what other things that you can do to help prevent falls. This information is not intended to replace advice given to you by your health care provider. Make sure you discuss any questions you have with your health care provider. Document Released: 03/30/2009 Document Revised: 11/09/2015 Document Reviewed: 07/08/2014 Elsevier Interactive Patient Education  2017 [employer].

## 2021-07-04 ENCOUNTER — Encounter: Payer: Self-pay | Admitting: Internal Medicine

## 2021-07-04 ENCOUNTER — Other Ambulatory Visit: Payer: Self-pay | Admitting: Nurse Practitioner

## 2021-07-04 ENCOUNTER — Ambulatory Visit (INDEPENDENT_AMBULATORY_CARE_PROVIDER_SITE_OTHER): Payer: Medicare Other | Admitting: Internal Medicine

## 2021-07-04 ENCOUNTER — Other Ambulatory Visit: Payer: Self-pay

## 2021-07-04 VITALS — BP 112/70 | HR 80 | Temp 98.0°F | Ht 60.8 in | Wt 145.3 lb

## 2021-07-04 DIAGNOSIS — D692 Other nonthrombocytopenic purpura: Secondary | ICD-10-CM | POA: Insufficient documentation

## 2021-07-04 DIAGNOSIS — E7800 Pure hypercholesterolemia, unspecified: Secondary | ICD-10-CM | POA: Diagnosis not present

## 2021-07-04 DIAGNOSIS — E1169 Type 2 diabetes mellitus with other specified complication: Secondary | ICD-10-CM | POA: Insufficient documentation

## 2021-07-04 DIAGNOSIS — Z23 Encounter for immunization: Secondary | ICD-10-CM

## 2021-07-04 DIAGNOSIS — J449 Chronic obstructive pulmonary disease, unspecified: Secondary | ICD-10-CM | POA: Diagnosis not present

## 2021-07-04 DIAGNOSIS — Z01818 Encounter for other preprocedural examination: Secondary | ICD-10-CM

## 2021-07-04 DIAGNOSIS — R7309 Other abnormal glucose: Secondary | ICD-10-CM

## 2021-07-04 DIAGNOSIS — E559 Vitamin D deficiency, unspecified: Secondary | ICD-10-CM

## 2021-07-04 DIAGNOSIS — D509 Iron deficiency anemia, unspecified: Secondary | ICD-10-CM | POA: Insufficient documentation

## 2021-07-04 DIAGNOSIS — R600 Localized edema: Secondary | ICD-10-CM

## 2021-07-04 NOTE — Progress Notes (Signed)
Rich Brave Llittleton,acting as a Education administrator for Maximino Greenland, MD.,have documented all relevant documentation on the behalf of Maximino Greenland, MD,as directed by  Maximino Greenland, MD while in the presence of Maximino Greenland, MD.  This visit occurred during the SARS-CoV-2 public health emergency.  Safety protocols were in place, including screening questions prior to the visit, additional usage of staff PPE, and extensive cleaning of exam room while observing appropriate contact time as indicated for disinfecting solutions.  Subjective:     Patient ID: Tracey Castro , female    DOB: 1945/12/14 , 76 y.o.   MRN: [id number]   Chief Complaint  Patient presents with   Annual Exam    HPI  She presents for physical exam. However, she was not aware that her insurance does not cover physical exams. She reports compliance with medications. She has no concerns at this time.   She is scheduled for right hip replacement tomorrow at local surgical center. She is followed by Dr. Rhona Raider. She has not been able to exercise due to severity of her right hip pain.   She states she has multiple questions today - wants to discuss CBC results in detail today. She went to [facility] for further evaluation of hair loss. She was advised of an iron deficiency, but she is not sure of the cause. She denies having any gingival and rectal bleeding. She reports having a healthy diet, full of greens. She is now on an iron supplement, she is concerned her condition will recur once she stops the supplements.  Hyperlipidemia This is a chronic problem. The current episode started more than 1 year ago. The problem is uncontrolled. Pertinent negatives include no chest pain, leg pain or shortness of breath. Current antihyperlipidemic treatment includes diet change. Risk factors for coronary artery disease include dyslipidemia and post-menopausal.    Past Medical History:  Diagnosis Date   Hyperlipidemia    Mycobacterium avium  complex ([place])      Family History  Problem Relation Age of Onset   Cancer - Ovarian Mother    Cancer - Colon Father    Parkinson's disease Father      Current Outpatient Medications:    Cholecalciferol (VITAMIN D) 2000 units tablet, Take 4,000-6,000 Units by mouth daily., Disp: , Rfl:    diphenhydrAMINE HCl, Sleep, 25 MG CAPS, Take 25 mg by mouth daily as needed (sleep)., Disp: , Rfl:    Ferrous Gluconate-C-Folic Acid (IRON-C PO), Take 65 mg by mouth., Disp: , Rfl:    fluticasone (FLONASE) 50 MCG/ACT nasal spray, Place 1 spray into both nostrils daily as needed for allergies., Disp: , Rfl:    fluticasone furoate-vilanterol (BREO ELLIPTA) 100-25 MCG/INH AEPB, Inhale 1 puff into the lungs daily. USE 1 INHALATION DAILY, Disp: 180 each, Rfl: 1   ibuprofen (ADVIL,MOTRIN) 200 MG tablet, Take 600 mg by mouth 2 (two) times daily as needed for headache or moderate pain., Disp: , Rfl:    Magnesium 500 MG CAPS, Take 500 mg by mouth daily., Disp: , Rfl:    Multiple Vitamin (MULTIVITAMIN) capsule, Take 1 capsule by mouth daily., Disp: , Rfl:    tretinoin (RETIN-A) 0.025 % cream, Apply 1 application topically at bedtime., Disp: , Rfl:    furosemide (LASIX) 20 MG tablet, Take 1 tablet (20 mg total) by mouth daily for 3 days. (Patient not taking: Reported on 07/04/2021), Disp: 3 tablet, Rfl: 0   hydrochlorothiazide (MICROZIDE) 12.5 MG capsule, TAKE 1 CAPSULE BY MOUTH EVERY DAY  AS NEEDED, Disp: 30 capsule, Rfl: 1   Allergies  Allergen Reactions   Sulfamethoxazole Rash      The patient states she uses post menopausal status for birth control. Last LMP was No LMP recorded. Patient has had a hysterectomy.. Negative for Dysmenorrhea. Negative for: breast discharge, breast lump(s), breast pain and breast self exam. Associated symptoms include abnormal vaginal bleeding. Pertinent negatives include abnormal bleeding (hematology), anxiety, decreased libido, depression, difficulty falling sleep, dyspareunia,  history of infertility, nocturia, sexual dysfunction, sleep disturbances, urinary incontinence, urinary urgency, vaginal discharge and vaginal itching. Diet regular.The patient states her exercise level is    . The patient's tobacco use is:  Social History   Tobacco Use  Smoking Status Never  Smokeless Tobacco Never  . She has been exposed to passive smoke. The patient's alcohol use is:  Social History   Substance and Sexual Activity  Alcohol Use Yes   Alcohol/week: 4.0 standard drinks   Types: 4 Glasses of wine per week   Review of Systems  Constitutional: Negative.   HENT: Negative.    Eyes: Negative.   Respiratory: Negative.  Negative for shortness of breath.   Cardiovascular: Negative.  Negative for chest pain.  Gastrointestinal: Negative.   Endocrine: Negative.   Genitourinary: Negative.   Musculoskeletal:  Positive for arthralgias.  Skin: Negative.   Allergic/Immunologic: Negative.   Neurological: Negative.   Hematological: Negative.   Psychiatric/Behavioral: Negative.      Today's Vitals   07/04/21 0906  BP: 112/70  Pulse: 80  Temp: 98 F (36.7 C)  Weight: 145 lb 4.8 oz (65.9 kg)  Height: 5' 0.8" (1.544 m)  PainSc: 0-No pain   Body mass index is 27.64 kg/m.   Objective:  Physical Exam Vitals and nursing note reviewed.  Constitutional:      Appearance: Normal appearance.  HENT:     Head: Normocephalic and atraumatic.     Right Ear: Tympanic membrane, ear canal and external ear normal.     Left Ear: Tympanic membrane, ear canal and external ear normal.     Nose:     Comments: Masked     Mouth/Throat:     Comments: Masked  Eyes:     Extraocular Movements: Extraocular movements intact.     Conjunctiva/sclera: Conjunctivae normal.     Pupils: Pupils are equal, round, and reactive to light.  Cardiovascular:     Rate and Rhythm: Normal rate and regular rhythm.     Pulses: Normal pulses.     Heart sounds: Normal heart sounds.  Pulmonary:     Effort:  Pulmonary effort is normal.     Breath sounds: Normal breath sounds.  Chest:  Breasts:    Tanner Score is 5.     Right: Normal.     Left: Normal.  Abdominal:     General: Abdomen is flat. Bowel sounds are normal.     Palpations: Abdomen is soft.  Genitourinary:    Comments: deferred Musculoskeletal:        General: Normal range of motion.     Cervical back: Normal range of motion and neck supple.  Skin:    General: Skin is warm and dry.  Neurological:     General: No focal deficit present.     Mental Status: She is alert and oriented to person, place, and time.  Psychiatric:        Mood and Affect: Mood normal.        Behavior: Behavior normal.  Assessment And Plan:     1. Pure hypercholesterolemia Comments: Full CPE performed today. Chronic, I will check non-fasting lipid panel today. I will make further recommendations once her labs are available for review. She is encouraged to follow a heart healthy lifestyle.  - Lipid panel  2. Other abnormal glucose Comments: Her a1c has been elevated in the past. Her last a1c was 6.3 earlier this month. She is encouraged to limit her intake of refined carbs, sugary drinks.   3. Vitamin D deficiency disease Comments: I will check a vitamin D level and supplement as needed.  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 She will continue with Breo daily.   5. Iron deficiency anemia, unspecified iron deficiency anemia type Comments: Pt advised it could be nutritional deficiency. She was also given a stool card to return to office next week. I suggest we recheck her levels 3 months after she has stopped the supplements. She has Derm f/u in May 2023.   6. Immunization due Comments: She is due for Shingrix, advised to wait at least four weeks after her surgery.  7. Pre-op exam Comments: EKG performed, NSR w/o acute changes.  - EKG 12-Lead  Patient was given opportunity  to ask questions. Patient verbalized understanding of the plan and was able to repeat key elements of the plan. All questions were answered to their satisfaction.   I, Maximino Greenland, MD, have reviewed all documentation for this visit. The documentation on 07/04/21 for the exam, diagnosis, procedures, and orders are all accurate and complete.   THE PATIENT IS ENCOURAGED TO PRACTICE SOCIAL DISTANCING DUE TO THE COVID-19 PANDEMIC.

## 2021-07-04 NOTE — Patient Instructions (Signed)

## 2021-07-05 DIAGNOSIS — M1611 Unilateral primary osteoarthritis, right hip: Secondary | ICD-10-CM | POA: Diagnosis not present

## 2021-07-05 DIAGNOSIS — Z96641 Presence of right artificial hip joint: Secondary | ICD-10-CM | POA: Diagnosis not present

## 2021-07-05 HISTORY — PX: TOTAL HIP ARTHROPLASTY: SHX124

## 2021-07-05 LAB — VITAMIN D 25 HYDROXY (VIT D DEFICIENCY, FRACTURES): Vit D, 25-Hydroxy: 55.3 ng/mL (ref 30.0–100.0)

## 2021-07-05 LAB — LIPID PANEL
Chol/HDL Ratio: 3.6 ratio (ref 0.0–4.4)
Cholesterol, Total: 180 mg/dL (ref 100–199)
HDL: 50 mg/dL (ref >39)
LDL Chol Calc (NIH): 109 mg/dL — ABNORMAL HIGH (ref 0–99)
Triglycerides: 117 mg/dL (ref 0–149)
VLDL Cholesterol Cal: 21 mg/dL (ref 5–40)

## 2021-07-09 DIAGNOSIS — Z96641 Presence of right artificial hip joint: Secondary | ICD-10-CM | POA: Diagnosis not present

## 2021-07-09 DIAGNOSIS — M6281 Muscle weakness (generalized): Secondary | ICD-10-CM | POA: Diagnosis not present

## 2021-07-09 DIAGNOSIS — M25651 Stiffness of right hip, not elsewhere classified: Secondary | ICD-10-CM | POA: Diagnosis not present

## 2021-07-11 DIAGNOSIS — M25651 Stiffness of right hip, not elsewhere classified: Secondary | ICD-10-CM | POA: Diagnosis not present

## 2021-07-11 DIAGNOSIS — Z96641 Presence of right artificial hip joint: Secondary | ICD-10-CM | POA: Diagnosis not present

## 2021-07-11 DIAGNOSIS — M6281 Muscle weakness (generalized): Secondary | ICD-10-CM | POA: Diagnosis not present

## 2021-07-16 DIAGNOSIS — M1611 Unilateral primary osteoarthritis, right hip: Secondary | ICD-10-CM | POA: Diagnosis not present

## 2021-07-16 DIAGNOSIS — Z9889 Other specified postprocedural states: Secondary | ICD-10-CM | POA: Diagnosis not present

## 2021-07-16 DIAGNOSIS — M6281 Muscle weakness (generalized): Secondary | ICD-10-CM | POA: Diagnosis not present

## 2021-07-16 DIAGNOSIS — Z96641 Presence of right artificial hip joint: Secondary | ICD-10-CM | POA: Diagnosis not present

## 2021-07-16 DIAGNOSIS — M25651 Stiffness of right hip, not elsewhere classified: Secondary | ICD-10-CM | POA: Diagnosis not present

## 2021-07-18 DIAGNOSIS — Z96641 Presence of right artificial hip joint: Secondary | ICD-10-CM | POA: Diagnosis not present

## 2021-07-18 DIAGNOSIS — M6281 Muscle weakness (generalized): Secondary | ICD-10-CM | POA: Diagnosis not present

## 2021-07-18 DIAGNOSIS — M25651 Stiffness of right hip, not elsewhere classified: Secondary | ICD-10-CM | POA: Diagnosis not present

## 2021-07-23 DIAGNOSIS — M25651 Stiffness of right hip, not elsewhere classified: Secondary | ICD-10-CM | POA: Diagnosis not present

## 2021-07-23 DIAGNOSIS — Z96641 Presence of right artificial hip joint: Secondary | ICD-10-CM | POA: Diagnosis not present

## 2021-07-23 DIAGNOSIS — M6281 Muscle weakness (generalized): Secondary | ICD-10-CM | POA: Diagnosis not present

## 2021-07-25 DIAGNOSIS — M25651 Stiffness of right hip, not elsewhere classified: Secondary | ICD-10-CM | POA: Diagnosis not present

## 2021-07-25 DIAGNOSIS — M6281 Muscle weakness (generalized): Secondary | ICD-10-CM | POA: Diagnosis not present

## 2021-07-25 DIAGNOSIS — Z96641 Presence of right artificial hip joint: Secondary | ICD-10-CM | POA: Diagnosis not present

## 2021-07-30 DIAGNOSIS — M25651 Stiffness of right hip, not elsewhere classified: Secondary | ICD-10-CM | POA: Diagnosis not present

## 2021-07-30 DIAGNOSIS — Z96641 Presence of right artificial hip joint: Secondary | ICD-10-CM | POA: Diagnosis not present

## 2021-07-30 DIAGNOSIS — M6281 Muscle weakness (generalized): Secondary | ICD-10-CM | POA: Diagnosis not present

## 2021-08-01 DIAGNOSIS — M25651 Stiffness of right hip, not elsewhere classified: Secondary | ICD-10-CM | POA: Diagnosis not present

## 2021-08-01 DIAGNOSIS — Z96641 Presence of right artificial hip joint: Secondary | ICD-10-CM | POA: Diagnosis not present

## 2021-08-01 DIAGNOSIS — M6281 Muscle weakness (generalized): Secondary | ICD-10-CM | POA: Diagnosis not present

## 2021-08-06 DIAGNOSIS — M25651 Stiffness of right hip, not elsewhere classified: Secondary | ICD-10-CM | POA: Diagnosis not present

## 2021-08-06 DIAGNOSIS — Z96641 Presence of right artificial hip joint: Secondary | ICD-10-CM | POA: Diagnosis not present

## 2021-08-06 DIAGNOSIS — M6281 Muscle weakness (generalized): Secondary | ICD-10-CM | POA: Diagnosis not present

## 2021-08-08 DIAGNOSIS — M6281 Muscle weakness (generalized): Secondary | ICD-10-CM | POA: Diagnosis not present

## 2021-08-08 DIAGNOSIS — Z96641 Presence of right artificial hip joint: Secondary | ICD-10-CM | POA: Diagnosis not present

## 2021-08-08 DIAGNOSIS — M25651 Stiffness of right hip, not elsewhere classified: Secondary | ICD-10-CM | POA: Diagnosis not present

## 2021-08-15 DIAGNOSIS — M25651 Stiffness of right hip, not elsewhere classified: Secondary | ICD-10-CM | POA: Diagnosis not present

## 2021-08-15 DIAGNOSIS — Z96641 Presence of right artificial hip joint: Secondary | ICD-10-CM | POA: Diagnosis not present

## 2021-08-15 DIAGNOSIS — M6281 Muscle weakness (generalized): Secondary | ICD-10-CM | POA: Diagnosis not present

## 2021-08-17 DIAGNOSIS — M25651 Stiffness of right hip, not elsewhere classified: Secondary | ICD-10-CM | POA: Diagnosis not present

## 2021-08-17 DIAGNOSIS — Z96641 Presence of right artificial hip joint: Secondary | ICD-10-CM | POA: Diagnosis not present

## 2021-08-17 DIAGNOSIS — M6281 Muscle weakness (generalized): Secondary | ICD-10-CM | POA: Diagnosis not present

## 2021-08-20 DIAGNOSIS — M25651 Stiffness of right hip, not elsewhere classified: Secondary | ICD-10-CM | POA: Diagnosis not present

## 2021-08-20 DIAGNOSIS — M6281 Muscle weakness (generalized): Secondary | ICD-10-CM | POA: Diagnosis not present

## 2021-08-20 DIAGNOSIS — Z96641 Presence of right artificial hip joint: Secondary | ICD-10-CM | POA: Diagnosis not present

## 2021-08-22 DIAGNOSIS — M25651 Stiffness of right hip, not elsewhere classified: Secondary | ICD-10-CM | POA: Diagnosis not present

## 2021-08-22 DIAGNOSIS — M6281 Muscle weakness (generalized): Secondary | ICD-10-CM | POA: Diagnosis not present

## 2021-08-22 DIAGNOSIS — Z96641 Presence of right artificial hip joint: Secondary | ICD-10-CM | POA: Diagnosis not present

## 2021-08-29 DIAGNOSIS — M6281 Muscle weakness (generalized): Secondary | ICD-10-CM | POA: Diagnosis not present

## 2021-08-29 DIAGNOSIS — M25651 Stiffness of right hip, not elsewhere classified: Secondary | ICD-10-CM | POA: Diagnosis not present

## 2021-08-29 DIAGNOSIS — Z96641 Presence of right artificial hip joint: Secondary | ICD-10-CM | POA: Diagnosis not present

## 2021-09-25 ENCOUNTER — Encounter: Payer: Self-pay | Admitting: Internal Medicine

## 2021-10-17 DIAGNOSIS — Z23 Encounter for immunization: Secondary | ICD-10-CM | POA: Diagnosis not present

## 2021-10-19 DIAGNOSIS — M1611 Unilateral primary osteoarthritis, right hip: Secondary | ICD-10-CM | POA: Diagnosis not present

## 2021-10-19 DIAGNOSIS — Z96641 Presence of right artificial hip joint: Secondary | ICD-10-CM | POA: Diagnosis not present

## 2021-10-19 DIAGNOSIS — Z471 Aftercare following joint replacement surgery: Secondary | ICD-10-CM | POA: Diagnosis not present

## 2021-11-20 DIAGNOSIS — M1712 Unilateral primary osteoarthritis, left knee: Secondary | ICD-10-CM | POA: Diagnosis not present

## 2021-11-20 DIAGNOSIS — Z96641 Presence of right artificial hip joint: Secondary | ICD-10-CM | POA: Diagnosis not present

## 2021-11-20 DIAGNOSIS — M25551 Pain in right hip: Secondary | ICD-10-CM | POA: Diagnosis not present

## 2021-12-25 DIAGNOSIS — L650 Telogen effluvium: Secondary | ICD-10-CM | POA: Diagnosis not present

## 2022-01-02 ENCOUNTER — Encounter: Payer: Self-pay | Admitting: Internal Medicine

## 2022-01-02 ENCOUNTER — Ambulatory Visit (INDEPENDENT_AMBULATORY_CARE_PROVIDER_SITE_OTHER): Payer: Medicare Other | Admitting: Internal Medicine

## 2022-01-02 ENCOUNTER — Other Ambulatory Visit: Payer: Self-pay | Admitting: Internal Medicine

## 2022-01-02 VITALS — BP 122/66 | HR 76 | Temp 97.7°F | Ht 61.0 in | Wt 146.0 lb

## 2022-01-02 DIAGNOSIS — Z1231 Encounter for screening mammogram for malignant neoplasm of breast: Secondary | ICD-10-CM

## 2022-01-02 DIAGNOSIS — E7800 Pure hypercholesterolemia, unspecified: Secondary | ICD-10-CM

## 2022-01-02 DIAGNOSIS — L659 Nonscarring hair loss, unspecified: Secondary | ICD-10-CM | POA: Diagnosis not present

## 2022-01-02 DIAGNOSIS — Z6827 Body mass index (BMI) 27.0-27.9, adult: Secondary | ICD-10-CM

## 2022-01-02 DIAGNOSIS — Z79899 Other long term (current) drug therapy: Secondary | ICD-10-CM | POA: Diagnosis not present

## 2022-01-02 DIAGNOSIS — D509 Iron deficiency anemia, unspecified: Secondary | ICD-10-CM | POA: Diagnosis not present

## 2022-01-02 LAB — POC HEMOCCULT BLD/STL (OFFICE/1-CARD/DIAGNOSTIC): Fecal Occult Blood, POC: NEGATIVE

## 2022-01-02 NOTE — Progress Notes (Signed)
Rich Brave Llittleton,acting as a Education administrator for Maximino Greenland, MD.,have documented all relevant documentation on the behalf of Maximino Greenland, MD,as directed by  Maximino Greenland, MD while in the presence of Maximino Greenland, MD.    Subjective:     Patient ID: Tracey Castro , female    DOB: [birth year] , 76 y.o.   MRN: [id number]   Chief Complaint  Patient presents with   Hyperlipidemia    HPI  Patient presents today for a chol check. She is not taking any meds for this, she is working on diet/exercise.   She would also like for you to check her iron, her Dermatologist found that her levels were low which contributed to her hair loss. She has been taking iron supplements and this has since resolved.   Hyperlipidemia This is a chronic problem. The current episode started more than 1 year ago. The problem is uncontrolled. Pertinent negatives include no chest pain, leg pain or shortness of breath. Current antihyperlipidemic treatment includes diet change. Risk factors for coronary artery disease include dyslipidemia and post-menopausal.     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([place])      Family History  Problem Relation Age of Onset   Cancer - Ovarian Mother    Cancer - Colon Father    Parkinson's disease Father      Current Outpatient Medications:    Cholecalciferol (VITAMIN D) 2000 units tablet, Take 4,000-6,000 Units by mouth daily., Disp: , Rfl:    diphenhydrAMINE HCl, Sleep, 25 MG CAPS, Take 25 mg by mouth daily as needed (sleep)., Disp: , Rfl:    Ferrous Gluconate-C-Folic Acid (IRON-C PO), Take 65 mg by mouth., Disp: , Rfl:    fluticasone (FLONASE) 50 MCG/ACT nasal spray, Place 1 spray into both nostrils daily as needed for allergies., Disp: , Rfl:    fluticasone furoate-vilanterol (BREO ELLIPTA) 100-25 MCG/INH AEPB, Inhale 1 puff into the lungs daily. USE 1 INHALATION DAILY, Disp: 180 each, Rfl: 1   ibuprofen  (ADVIL,MOTRIN) 200 MG tablet, Take 600 mg by mouth 2 (two) times daily as needed for headache or moderate pain., Disp: , Rfl:    Magnesium 500 MG CAPS, Take 500 mg by mouth daily., Disp: , Rfl:    Multiple Vitamin (MULTIVITAMIN) capsule, Take 1 capsule by mouth daily., Disp: , Rfl:    tretinoin (RETIN-A) 0.025 % cream, Apply 1 application topically at bedtime., Disp: , Rfl:    hydrochlorothiazide (MICROZIDE) 12.5 MG capsule, TAKE 1 CAPSULE BY MOUTH EVERY DAY AS NEEDED (Patient not taking: Reported on 01/02/2022), Disp: 30 capsule, Rfl: 1   Allergies  Allergen Reactions   Sulfamethoxazole Rash     Review of Systems  Constitutional: Negative.   Respiratory: Negative.  Negative for shortness of breath.   Cardiovascular: Negative.  Negative for chest pain.  Gastrointestinal: Negative.   Neurological: Negative.   Psychiatric/Behavioral: Negative.       Today's Vitals   01/02/22 0939  BP: 122/66  Pulse: 76  Temp: 97.7 F (36.5 C)  Weight: 146 lb (66.2 kg)  Height: _0  (1.549 m)  PainSc: 0-No pain   Body mass index is 27.59 kg/m.   Objective:  Physical Exam Vitals and nursing note reviewed.  Constitutional:      Appearance: Normal appearance.  HENT:     Head: Normocephalic and atraumatic.  Eyes:     Extraocular Movements: Extraocular movements intact.  Cardiovascular:  Rate and Rhythm: Normal rate and regular rhythm.     Heart sounds: Normal heart sounds.  Pulmonary:     Effort: Pulmonary effort is normal.     Breath sounds: Normal breath sounds.  Musculoskeletal:     Cervical back: Normal range of motion.  Skin:    General: Skin is warm.  Neurological:     General: No focal deficit present.     Mental Status: She is alert.  Psychiatric:        Mood and Affect: Mood normal.        Behavior: Behavior normal.      Assessment And Plan:     1. Pure hypercholesterolemia Comments: Chronic, cardiac risk is listed on her AWV, it is 17%. Need for statin therapy was  d/w pt, she does not wish to pursue this. Will consider cardiac calcium score - Lipid panel  2. Iron deficiency anemia, unspecified iron deficiency anemia type Comments: I will check labs as below and direct supplementation as needed.  - CBC no Diff - Iron, TIBC and Ferritin Panel - Vitamin B12 - POC Hemoccult Bld/Stl (1-Cd Office Dx)  3. Hair loss Comments: Improved with iron supplementation.   4. BMI 27.0-27.9,adult Comments: She is encouraged to aim for at least 150 minutes of exercise per week.    Patient was given opportunity to ask questions. Patient verbalized understanding of the plan and was able to repeat key elements of the plan. All questions were answered to their satisfaction.   I, Maximino Greenland, MD, have reviewed all documentation for this visit. The documentation on 01/02/22 for the exam, diagnosis, procedures, and orders are all accurate and complete.   IF YOU HAVE BEEN REFERRED TO A SPECIALIST, IT MAY TAKE 1-2 WEEKS TO SCHEDULE/PROCESS THE REFERRAL. IF YOU HAVE NOT HEARD FROM US/SPECIALIST IN TWO WEEKS, PLEASE GIVE [place] A CALL AT [phone] X [phone].   THE PATIENT IS ENCOURAGED TO PRACTICE SOCIAL DISTANCING DUE TO THE COVID-19 PANDEMIC.

## 2022-01-02 NOTE — Patient Instructions (Addendum)
The 10-year ASCVD risk score (Arnett DK, et al., 2019) is: 17.9%   Values used to calculate the score:     Age: 76 years     Sex: Female     Is Non-Hispanic African American: No     Diabetic: No     Tobacco smoker: No     Systolic Blood Pressure: 338 mmHg     Is BP treated: No     HDL Cholesterol: 50 mg/dL     Total Cholesterol: 180 mg/dL   Coronary Calcium Scan A coronary calcium scan is an imaging test used to look for deposits of plaque in the inner lining of the blood vessels of the heart (coronary arteries). Plaque is made up of calcium, protein, and fatty substances. These deposits of plaque can partly clog and narrow the coronary arteries without producing any symptoms or warning signs. This puts a person at risk for a heart attack. A coronary calcium scan is performed using a computed tomography (CT) scanner machine without using a dye (contrast). This test is recommended for people who are at moderate risk for heart disease. The test can find plaque deposits before symptoms develop. Tell a health care provider about: Any allergies you have. All medicines you are taking, including vitamins, herbs, eye drops, creams, and over-the-counter medicines. Any problems you or family members have had with anesthetic medicines. Any bleeding problems you have. Any surgeries you have had. Any medical conditions you have. Whether you are pregnant or may be pregnant. What are the risks? Generally, this is a safe procedure. However, problems may occur, including: Harm to a pregnant woman and her unborn baby. This test involves the use of radiation. Radiation exposure can be dangerous to a pregnant woman and her unborn baby. If you are pregnant or think you may be pregnant, you should not have this procedure done. A slight increase in the risk of cancer. This is because of the radiation involved in the test. The amount of radiation from one test is similar to the amount of radiation you are  naturally exposed to over one year. What happens before the procedure? Ask your health care provider for any specific instructions on how to prepare for this procedure. You may be asked to avoid products that contain caffeine, tobacco, or nicotine for 4 hours before the procedure. What happens during the procedure?  You will undress and remove any jewelry from your neck or chest. You may need to remove hearing aides and dentures. Women may need to remove their bras. You will put on a hospital gown. Sticky electrodes will be placed on your chest. The electrodes will be connected to an electrocardiogram (ECG) machine to record a tracing of the electrical activity of your heart. You will lie down on your back on a curved bed that is attached to the [place]. You may be given medicine to slow down your heart rate so that clear pictures can be created. You will be moved into the CT scanner, and the CT scanner will take pictures of your heart. During this time, you will be asked to lie still and hold your breath for 10-20 seconds at a time while each picture of your heart is being taken. The procedure may vary among health care providers and hospitals. What can I expect after the procedure? You can return to your normal activities. It is up to you to get the results of your procedure. Ask your health care provider, or the department that is  doing the procedure, when your results will be ready. Summary A coronary calcium scan is an imaging test used to look for deposits of plaque in the inner lining of the blood vessels of the heart. Plaque is made up of calcium, protein, and fatty substances. A coronary calcium scan is performed using a CT scanner machine without contrast. Generally, this is a safe procedure. Tell your health care provider if you are pregnant or may be pregnant. Ask your health care provider for any specific instructions on how to prepare for this procedure. You can return to your  normal activities after the scan is done. This information is not intended to replace advice given to you by your health care provider. Make sure you discuss any questions you have with your health care provider. Document Revised: 05/13/2021 Document Reviewed: 05/13/2021 Elsevier Patient Education  [place].

## 2022-01-03 LAB — CBC
Hematocrit: 39.2 % (ref 34.0–46.6)
Hemoglobin: 12.8 g/dL (ref 11.1–15.9)
MCH: 26.7 pg (ref 26.6–33.0)
MCHC: 32.7 g/dL (ref 31.5–35.7)
MCV: 82 fL (ref 79–97)
Platelets: 378 10*3/uL (ref 150–450)
RBC: 4.79 x10E6/uL (ref 3.77–5.28)
RDW: 15.1 % (ref 11.7–15.4)
WBC: 5.7 10*3/uL (ref 3.4–10.8)

## 2022-01-03 LAB — LIPID PANEL
Chol/HDL Ratio: 3.8 ratio (ref 0.0–4.4)
Cholesterol, Total: 199 mg/dL (ref 100–199)
HDL: 53 mg/dL (ref >39)
LDL Chol Calc (NIH): 121 mg/dL — ABNORMAL HIGH (ref 0–99)
Triglycerides: 141 mg/dL (ref 0–149)
VLDL Cholesterol Cal: 25 mg/dL (ref 5–40)

## 2022-01-03 LAB — IRON,TIBC AND FERRITIN PANEL
Ferritin: 87 ng/mL (ref 15–150)
Iron Saturation: 16 % (ref 15–55)
Iron: 51 ug/dL (ref 27–139)
Total Iron Binding Capacity: 315 ug/dL (ref 250–450)
UIBC: 264 ug/dL (ref 118–369)

## 2022-01-03 LAB — VITAMIN B12: Vitamin B-12: 300 pg/mL (ref 232–1245)

## 2022-01-13 ENCOUNTER — Other Ambulatory Visit: Payer: Self-pay | Admitting: Internal Medicine

## 2022-01-13 DIAGNOSIS — E7800 Pure hypercholesterolemia, unspecified: Secondary | ICD-10-CM

## 2022-01-22 ENCOUNTER — Ambulatory Visit
Admission: RE | Admit: 2022-01-22 | Discharge: 2022-01-22 | Disposition: A | Discharge status: Home / Self Care | Payer: Medicare Other | Source: Ambulatory Visit | Attending: Internal Medicine | Admitting: Internal Medicine

## 2022-01-22 DIAGNOSIS — Z1231 Encounter for screening mammogram for malignant neoplasm of breast: Secondary | ICD-10-CM

## 2022-01-25 ENCOUNTER — Ambulatory Visit: Payer: PRIVATE HEALTH INSURANCE

## 2022-02-15 ENCOUNTER — Ambulatory Visit (HOSPITAL_BASED_OUTPATIENT_CLINIC_OR_DEPARTMENT_OTHER)
Admission: RE | Admit: 2022-02-15 | Discharge: 2022-02-15 | Disposition: A | Discharge status: Home / Self Care | Payer: Medicare Other | Source: Ambulatory Visit | Attending: Internal Medicine | Admitting: Internal Medicine

## 2022-02-15 DIAGNOSIS — E7800 Pure hypercholesterolemia, unspecified: Secondary | ICD-10-CM | POA: Insufficient documentation

## 2022-02-20 ENCOUNTER — Encounter: Payer: Self-pay | Admitting: Internal Medicine

## 2022-02-21 ENCOUNTER — Encounter: Payer: Self-pay | Admitting: Internal Medicine

## 2022-04-01 DIAGNOSIS — Z23 Encounter for immunization: Secondary | ICD-10-CM | POA: Diagnosis not present

## 2022-04-24 ENCOUNTER — Ambulatory Visit (INDEPENDENT_AMBULATORY_CARE_PROVIDER_SITE_OTHER): Payer: Medicare Other | Admitting: Nurse Practitioner

## 2022-04-24 VITALS — BP 118/60 | HR 98 | Temp 98.5°F | Ht 61.0 in | Wt 144.8 lb

## 2022-04-24 DIAGNOSIS — R300 Dysuria: Secondary | ICD-10-CM | POA: Diagnosis not present

## 2022-04-24 LAB — POCT URINALYSIS DIPSTICK
Bilirubin, UA: NEGATIVE
Glucose, UA: NEGATIVE
Ketones, UA: NEGATIVE
Nitrite, UA: NEGATIVE
Protein, UA: NEGATIVE
Spec Grav, UA: 1.015 (ref 1.010–1.025)
Urobilinogen, UA: 0.2 E.U./dL
pH, UA: 6 (ref 5.0–8.0)

## 2022-04-24 MED ORDER — DOXYCYCLINE MONOHYDRATE 100 MG PO CAPS: 100.0000 mg | ORAL_CAPSULE | Freq: Two times a day (BID) | ORAL | 0 refills | Status: DC

## 2022-04-24 NOTE — Progress Notes (Signed)
Barnet Glasgow Martin,acting as a Education administrator for Minette Brine, FNP.,have documented all relevant documentation on the behalf of Minette Brine, FNP,as directed by  Minette Brine, FNP while in the presence of Minette Brine, [place].    Subjective:     Patient ID: Tracey Castro , female    DOB: 1945-09-12 , 76 y.o.   MRN: [id number]   Chief Complaint  Patient presents with   Dysuria    HPI  Patient presents today for UTI symptoms on Saturday or Sunday, she states having discomfort, she states having a feeling coming up her groin, she states "having to go often and not getting much"  she has pain when pressing on the area left groin. She had a small amount of urine urgency. Does not get urinary tract infections oftens.     BP Readings from Last 3 Encounters: 04/24/22 : 118/60 01/02/22 : 122/66 07/04/21 : 112/70       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([place])      Family History  Problem Relation Age of Onset   Cancer - Ovarian Mother    Cancer - Colon Father    Parkinson's disease Father      Current Outpatient Medications:    Cholecalciferol (VITAMIN D) 2000 units tablet, Take 4,000-6,000 Units by mouth daily., Disp: , Rfl:    diphenhydrAMINE HCl, Sleep, 25 MG CAPS, Take 25 mg by mouth daily as needed (sleep)., Disp: , Rfl:    Ferrous Gluconate-C-Folic Acid (IRON-C PO), Take 65 mg by mouth., Disp: , Rfl:    fluticasone furoate-vilanterol (BREO ELLIPTA) 100-25 MCG/INH AEPB, Inhale 1 puff into the lungs daily. USE 1 INHALATION DAILY, Disp: 180 each, Rfl: 1   ibuprofen (ADVIL,MOTRIN) 200 MG tablet, Take 600 mg by mouth 2 (two) times daily as needed for headache or moderate pain., Disp: , Rfl:    Multiple Vitamin (MULTIVITAMIN) capsule, Take 1 capsule by mouth daily., Disp: , Rfl:    fluticasone (FLONASE) 50 MCG/ACT nasal spray, Place 1 spray into both nostrils daily as needed for allergies., Disp: , Rfl:    hydrochlorothiazide  (MICROZIDE) 12.5 MG capsule, TAKE 1 CAPSULE BY MOUTH EVERY DAY AS NEEDED, Disp: 30 capsule, Rfl: 1   Magnesium 500 MG CAPS, Take 500 mg by mouth daily., Disp: , Rfl:    meloxicam (MOBIC) 15 MG tablet, Take 1 tablet (15 mg total) by mouth daily., Disp: 30 tablet, Rfl: 0   Allergies  Allergen Reactions   Sulfamethoxazole Rash     Review of Systems  Constitutional: Negative.   HENT: Negative.    Eyes: Negative.   Respiratory: Negative.    Cardiovascular: Negative.   Gastrointestinal: Negative.   Musculoskeletal:        She is also having tenderness to her left lateral calf intermittent.   Neurological: Negative.   Psychiatric/Behavioral: Negative.       Today's Vitals   04/24/22 1452  BP: 118/60  Pulse: 98  Temp: 98.5 F (36.9 C)  TempSrc: Oral  Weight: 144 lb 12.8 oz (65.7 kg)  Height: _0  (1.549 m)  PainSc: 2   PainLoc: Groin   Body mass index is 27.36 kg/m.  Wt Readings from Last 3 Encounters:  05/02/22 145 lb (65.8 kg)  04/24/22 144 lb 12.8 oz (65.7 kg)  01/02/22 146 lb (66.2 kg)    Objective:  Physical Exam Vitals reviewed.  Constitutional:      General: She is not in acute  distress.    Appearance: Normal appearance.  Cardiovascular:     Rate and Rhythm: Normal rate and regular rhythm.     Pulses: Normal pulses.     Heart sounds: Normal heart sounds. No murmur heard. Pulmonary:     Effort: Pulmonary effort is normal. No respiratory distress.     Breath sounds: Normal breath sounds.  Abdominal:     Tenderness: There is no right CVA tenderness or left CVA tenderness.  Neurological:     General: No focal deficit present.     Mental Status: She is alert and oriented to person, place, and time.     Cranial Nerves: No cranial nerve deficit.     Motor: No weakness.         Assessment And Plan:     1. Dysuria Comments: Will treat empirically and send urine culture had small leukocytes and trace blood in urine, negative CVA tenderness - POCT Urinalysis  Dipstick (81002) - Culture, Urine     Patient was given opportunity to ask questions. Patient verbalized understanding of the plan and was able to repeat key elements of the plan. All questions were answered to their satisfaction.  Minette Brine, FNP   I, Minette Brine, FNP, have reviewed all documentation for this visit. The documentation on 04/24/22 for the exam, diagnosis, procedures, and orders are all accurate and complete.   IF YOU HAVE BEEN REFERRED TO A SPECIALIST, IT MAY TAKE 1-2 WEEKS TO SCHEDULE/PROCESS THE REFERRAL. IF YOU HAVE NOT HEARD FROM US/SPECIALIST IN TWO WEEKS, PLEASE GIVE [place] A CALL AT [phone] X [phone].   THE PATIENT IS ENCOURAGED TO PRACTICE SOCIAL DISTANCING DUE TO THE COVID-19 PANDEMIC.

## 2022-04-24 NOTE — Patient Instructions (Signed)
Urinary Tract Infection, Adult A urinary tract infection (UTI) is an infection of any part of the urinary tract. The urinary tract includes: The kidneys. The ureters. The bladder. The urethra. These organs make, store, and get rid of pee (urine) in the body. What are the causes? This infection is caused by germs (bacteria) in your genital area. These germs grow and cause swelling (inflammation) of your urinary tract. What increases the risk? The following factors may make you more likely to develop this condition: Using a small, thin tube (catheter) to drain pee. Not being able to control when you pee or poop (incontinence). Being female. If you are female, these things can increase the risk: Using these methods to prevent pregnancy: A medicine that kills sperm (spermicide). A device that blocks sperm (diaphragm). Having low levels of a female hormone (estrogen). Being pregnant. You are more likely to develop this condition if: You have genes that add to your risk. You are sexually active. You take antibiotic medicines. You have trouble peeing because of: A prostate that is bigger than normal, if you are female. A blockage in the part of your body that drains pee from the bladder. A kidney stone. A nerve condition that affects your bladder. Not getting enough to drink. Not peeing often enough. You have other conditions, such as: Diabetes. A weak disease-fighting system (immune system). Sickle cell disease. Gout. Injury of the spine. What are the signs or symptoms? Symptoms of this condition include: Needing to pee right away. Peeing small amounts often. Pain or burning when peeing. Blood in the pee. Pee that smells bad or not like normal. Trouble peeing. Pee that is cloudy. Fluid coming from the vagina, if you are female. Pain in the belly or lower back. Other symptoms include: Vomiting. Not feeling hungry. Feeling mixed up (confused). This may be the first symptom in  older adults. Being tired and grouchy (irritable). A fever. Watery poop (diarrhea). How is this treated? Taking antibiotic medicine. Taking other medicines. Drinking enough water. In some cases, you may need to see a specialist. Follow these instructions at home:  Medicines Take over-the-counter and prescription medicines only as told by your doctor. If you were prescribed an antibiotic medicine, take it as told by your doctor. Do not stop taking it even if you start to feel better. General instructions Make sure you: Pee until your bladder is empty. Do not hold pee for a long time. Empty your bladder after sex. Wipe from front to back after peeing or pooping if you are a female. Use each tissue one time when you wipe. Drink enough fluid to keep your pee pale yellow. Keep all follow-up visits. Contact a doctor if: You do not get better after 1-2 days. Your symptoms go away and then come back. Get help right away if: You have very bad back pain. You have very bad pain in your lower belly. You have a fever. You have chills. You feeling like you will vomit or you vomit. Summary A urinary tract infection (UTI) is an infection of any part of the urinary tract. This condition is caused by germs in your genital area. There are many risk factors for a UTI. Treatment includes antibiotic medicines. Drink enough fluid to keep your pee pale yellow. This information is not intended to replace advice given to you by your health care provider. Make sure you discuss any questions you have with your health care provider. Document Revised: 01/14/2020 Document Reviewed: 01/14/2020 Elsevier Patient Education    2023 Elsevier Inc.  

## 2022-04-30 DIAGNOSIS — Z23 Encounter for immunization: Secondary | ICD-10-CM | POA: Diagnosis not present

## 2022-05-01 ENCOUNTER — Encounter: Payer: Self-pay | Admitting: Internal Medicine

## 2022-05-01 ENCOUNTER — Encounter: Payer: Self-pay | Admitting: Nurse Practitioner

## 2022-05-01 NOTE — Progress Notes (Unsigned)
    Benito Mccreedy D.Guffey [place] Phone: [phone]   Assessment and Plan:     There are no diagnoses linked to this encounter.  ***   Pertinent previous records reviewed include ***   Follow Up: ***     Subjective:   I, Erastus Bartolomei, am serving as a Education administrator for Doctor Glennon Mac'  Chief Complaint: left leg pain   HPI:   05/02/2022 Patient is a 76 year old female complaining of left leg pain. Patient states  Relevant Historical Information: ***  Additional pertinent review of systems negative.   Current Outpatient Medications:    Cholecalciferol (VITAMIN D) 2000 units tablet, Take 4,000-6,000 Units by mouth daily., Disp: , Rfl:    diphenhydrAMINE HCl, Sleep, 25 MG CAPS, Take 25 mg by mouth daily as needed (sleep)., Disp: , Rfl:    doxycycline (MONODOX) 100 MG capsule, Take 1 capsule (100 mg total) by mouth 2 (two) times daily., Disp: 10 capsule, Rfl: 0   Ferrous Gluconate-C-Folic Acid (IRON-C PO), Take 65 mg by mouth., Disp: , Rfl:    fluticasone (FLONASE) 50 MCG/ACT nasal spray, Place 1 spray into both nostrils daily as needed for allergies. (Patient not taking: Reported on 04/24/2022), Disp: , Rfl:    fluticasone furoate-vilanterol (BREO ELLIPTA) 100-25 MCG/INH AEPB, Inhale 1 puff into the lungs daily. USE 1 INHALATION DAILY, Disp: 180 each, Rfl: 1   hydrochlorothiazide (MICROZIDE) 12.5 MG capsule, TAKE 1 CAPSULE BY MOUTH EVERY DAY AS NEEDED (Patient not taking: Reported on 01/02/2022), Disp: 30 capsule, Rfl: 1   ibuprofen (ADVIL,MOTRIN) 200 MG tablet, Take 600 mg by mouth 2 (two) times daily as needed for headache or moderate pain., Disp: , Rfl:    Magnesium 500 MG CAPS, Take 500 mg by mouth daily. (Patient not taking: Reported on 04/24/2022), Disp: , Rfl:    Multiple Vitamin (MULTIVITAMIN) capsule, Take 1 capsule by mouth daily., Disp: , Rfl:    tretinoin (RETIN-A) 0.025 % cream, Apply 1  application topically at bedtime., Disp: , Rfl:    Objective:     There were no vitals filed for this visit.    There is no height or weight on file to calculate BMI.    Physical Exam:    ***   Electronically signed by:  Benito Mccreedy D.[facility] 3:47 PM 05/01/22

## 2022-05-02 ENCOUNTER — Ambulatory Visit (INDEPENDENT_AMBULATORY_CARE_PROVIDER_SITE_OTHER): Payer: Medicare Other | Admitting: Sports Medicine

## 2022-05-02 VITALS — HR 75 | Ht 61.0 in | Wt 145.0 lb

## 2022-05-02 DIAGNOSIS — M898X6 Other specified disorders of bone, lower leg: Secondary | ICD-10-CM

## 2022-05-02 MED ORDER — MELOXICAM 15 MG PO TABS: 15.0000 mg | ORAL_TABLET | Freq: Every day | ORAL | 0 refills | Status: DC

## 2022-05-02 NOTE — Patient Instructions (Addendum)
Good to see you  - Start meloxicam 15 mg daily x2 weeks.  If still having pain after 2 weeks, complete 3rd-week of meloxicam. May use remaining meloxicam as needed once daily for pain control.  Do not to use additional NSAIDs while taking meloxicam.  May use Tylenol [phone] mg 2 to 3 times a day for breakthrough pain. Knee HEP  3-4 week follow up

## 2022-05-06 DIAGNOSIS — M9903 Segmental and somatic dysfunction of lumbar region: Secondary | ICD-10-CM | POA: Diagnosis not present

## 2022-05-06 DIAGNOSIS — M5432 Sciatica, left side: Secondary | ICD-10-CM | POA: Diagnosis not present

## 2022-05-12 ENCOUNTER — Encounter: Payer: Self-pay | Admitting: Nurse Practitioner

## 2022-05-29 ENCOUNTER — Other Ambulatory Visit: Payer: Self-pay | Admitting: Sports Medicine

## 2022-06-04 ENCOUNTER — Ambulatory Visit: Payer: PRIVATE HEALTH INSURANCE | Admitting: Sports Medicine

## 2022-07-02 ENCOUNTER — Encounter: Payer: Self-pay | Admitting: Internal Medicine

## 2022-07-05 ENCOUNTER — Telehealth: Payer: Self-pay

## 2022-07-05 NOTE — Telephone Encounter (Signed)
I called and spoke with Tracey Castro she stated she tested positive for covid. She has an appointment with [place] on Monday I also offered her covid treatment. She declined treatment at this time said she just has a head cold. YL,RMA

## 2022-07-06 ENCOUNTER — Encounter: Payer: Self-pay | Admitting: Internal Medicine

## 2022-07-06 ENCOUNTER — Other Ambulatory Visit: Payer: Self-pay | Admitting: Internal Medicine

## 2022-07-06 ENCOUNTER — Telehealth: Payer: Self-pay | Admitting: Internal Medicine

## 2022-07-06 DIAGNOSIS — U071 COVID-19: Secondary | ICD-10-CM

## 2022-07-06 MED ORDER — LAGEVRIO 200 MG PO CAPS: 4.0000 | ORAL_CAPSULE | Freq: Two times a day (BID) | ORAL | 0 refills | Status: AC

## 2022-07-06 NOTE — Telephone Encounter (Signed)
Pt had positive COVID test yesterday. She tested again today and it is still positive. Pt advised that she does not need to do any further testing. She c/o "cold sx". No sob. Pt w/ h/o bronchiectasis. Will send rx Lagevrio. It has been greater than one year for renal fxn testing. No recent GFR on file. Advised to complete full course. Will have f/u virtual appt on Monday.   All questions answered to her satisfaction.   RS

## 2022-07-08 ENCOUNTER — Encounter: Payer: Self-pay | Admitting: Internal Medicine

## 2022-07-08 ENCOUNTER — Telehealth (INDEPENDENT_AMBULATORY_CARE_PROVIDER_SITE_OTHER): Payer: Medicare Other | Admitting: Internal Medicine

## 2022-07-08 DIAGNOSIS — J479 Bronchiectasis, uncomplicated: Secondary | ICD-10-CM

## 2022-07-08 DIAGNOSIS — R7309 Other abnormal glucose: Secondary | ICD-10-CM | POA: Diagnosis not present

## 2022-07-08 DIAGNOSIS — E559 Vitamin D deficiency, unspecified: Secondary | ICD-10-CM | POA: Diagnosis not present

## 2022-07-08 DIAGNOSIS — Z862 Personal history of diseases of the blood and blood-forming organs and certain disorders involving the immune mechanism: Secondary | ICD-10-CM

## 2022-07-08 DIAGNOSIS — D692 Other nonthrombocytopenic purpura: Secondary | ICD-10-CM

## 2022-07-08 DIAGNOSIS — E7800 Pure hypercholesterolemia, unspecified: Secondary | ICD-10-CM

## 2022-07-08 DIAGNOSIS — U071 COVID-19: Secondary | ICD-10-CM | POA: Diagnosis not present

## 2022-07-08 NOTE — Patient Instructions (Signed)

## 2022-07-08 NOTE — Progress Notes (Signed)
Virtual Visit via VIDEO   This visit type was conducted due to national recommendations for restrictions regarding the COVID-19 Pandemic (e.g. social distancing) in an effort to limit this patient's exposure and mitigate transmission in our community.  Due to her co-morbid illnesses, this patient is at least at moderate risk for complications without adequate follow up.  This format is felt to be most appropriate for this patient at this time.  All issues noted in this document were discussed and addressed.  A limited physical exam was performed with this format.    This visit type was conducted due to national recommendations for restrictions regarding the COVID-19 Pandemic (e.g. social distancing) in an effort to limit this patient's exposure and mitigate transmission in our community.  Patients identity confirmed using two different identifiers.  This format is felt to be most appropriate for this patient at this time.  All issues noted in this document were discussed and addressed.  No physical exam was performed (except for noted visual exam findings with Video Visits).    Date:  07/08/2022   ID:  Tracey Castro, DOB February 25, 1946, MRN [id number]  Patient Location:  Home  Provider location:   Office    Chief Complaint:  "I have COVID"  History of Present Illness:    Tracey Castro is a 77 y.o. female who presents via video conferencing for a telehealth visit today.    The patient does have symptoms concerning for COVID-19 infection (fever, chills, cough, or new shortness of breath).   She presents today for virtual visit. She prefers this method of contact due to COVID-19 pandemic.  She presents today for f/u recent COVID infection. She states she developed fatigue last Monday, but started to have chills on Wed. ON Friday, she did a home COVID test which was positive. She contacted the office, but was not interested in antiviral tx at that time. She contacted on call MD  (myself) over the weekend, Lagevrio was not covered. This was initially sent in b/c of no recent renal data. Renal dosing of Paxlovid was then sent to the pharmacy. She states this has helped with sinus congestion, no fever. She reports feeling better, has only had one side effect- bitter taste in her mouth. She is happy that she has started the antiviral therapy.        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[facility])    Past Surgical History:  Procedure Laterality Date   BREAST CYST ASPIRATION     INCISION AND DRAINAGE Left 08/28/2020   Procedure: INCISION AND DRAINAGE OF LEFT KNEE LACERATION;  Surgeon: Meredith Pel, MD;  Location: [facility];  Service: Orthopedics;  Laterality: Left;   VIDEO BRONCHOSCOPY Bilateral 03/27/2018   Procedure: VIDEO BRONCHOSCOPY WITHOUT FLUORO;  Surgeon: Tanda Rockers, MD;  Location: [place];  Service: Cardiopulmonary;  Laterality: Bilateral;     Current Meds  Medication Sig   Cholecalciferol (VITAMIN D) 2000 units tablet Take 4,000-6,000 Units by mouth daily.   diphenhydrAMINE HCl, Sleep, 25 MG CAPS Take 25 mg by mouth daily as needed (sleep).   Ferrous Gluconate-C-Folic Acid (IRON-C PO) Take 65 mg by mouth.   fluticasone (FLONASE) 50 MCG/ACT nasal spray Place 1 spray into both nostrils daily as needed for allergies.   fluticasone furoate-vilanterol (BREO ELLIPTA) 100-25 MCG/INH AEPB Inhale 1 puff into the lungs daily. USE 1 INHALATION DAILY   hydrochlorothiazide (MICROZIDE) 12.5 MG capsule TAKE 1  CAPSULE BY MOUTH EVERY DAY AS NEEDED   ibuprofen (ADVIL,MOTRIN) 200 MG tablet Take 600 mg by mouth 2 (two) times daily as needed for headache or moderate pain.   Magnesium 500 MG CAPS Take 500 mg by mouth daily.   meloxicam (MOBIC) 15 MG tablet Take 1 tablet (15 mg total) by mouth daily.   molnupiravir EUA (LAGEVRIO) 200 MG CAPS capsule Take 4 capsules (800 mg total) by mouth 2 (two) times daily for 5 days.    Multiple Vitamin (MULTIVITAMIN) capsule Take 1 capsule by mouth daily.     Allergies:   Sulfamethoxazole   Social History   Tobacco Use   Smoking status: Never   Smokeless tobacco: Never  Vaping Use   Vaping Use: Never used  Substance Use Topics   Alcohol use: Yes    Alcohol/week: 4.0 standard drinks of alcohol    Types: 4 Glasses of wine per week   Drug use: Never     Family Hx: The patient's family history includes Cancer - Colon in her father; Cancer - Ovarian in her mother; Parkinson's disease in her father.  ROS:   Please see the history of present illness.    Review of Systems  Constitutional: Negative.   HENT:  Positive for congestion.   Respiratory:  Positive for cough.   Cardiovascular: Negative.   Gastrointestinal: Negative.   Neurological: Negative.   Psychiatric/Behavioral: Negative.      All other systems reviewed and are negative.   Labs/Other Tests and Data Reviewed:    Recent Labs: 01/02/2022: Hemoglobin 12.8; Platelets 378   Recent Lipid Panel Lab Results  Component Value Date/Time   CHOL 199 01/02/2022 10:29 AM   TRIG 141 01/02/2022 10:29 AM   HDL 53 01/02/2022 10:29 AM   CHOLHDL 3.8 01/02/2022 10:29 AM   LDLCALC 121 (H) 01/02/2022 10:29 AM    Wt Readings from Last 3 Encounters:  05/02/22 145 lb (65.8 kg)  04/24/22 144 lb 12.8 oz (65.7 kg)  01/02/22 146 lb (66.2 kg)     Exam:    Vital Signs:  There were no vitals taken for this visit.    Physical Exam Vitals and nursing note reviewed.  HENT:     Head: Normocephalic and atraumatic.  Eyes:     Extraocular Movements: Extraocular movements intact.  Pulmonary:     Effort: Pulmonary effort is normal.  Skin:    Findings: Bruising present.     Comments: Purpura located on RUE  Neurological:     Mental Status: She is alert and oriented to person, place, and time.  Psychiatric:        Mood and Affect: Affect normal.     ASSESSMENT & PLAN:    1. COVID-19 Comments: She is advised  to complete Paxlovid rx. She is done with quarantine today. Advised to wear mask for the next 5 days when in public.  Advised patient to take Vitamin C, D, Zinc.  Keep yourself hydrated with a lot of water and rest. Take Delsym for cough and Mucinex as needed. Take Tylenol or pain reliever every 4-6 hours as needed for pain/fever/body ache. If you have elevated blood pressure, you can take OTC Coricidin as needed.   Educated patient if symptoms get worse or if she experiences any SOB, chest pain or pain in her legs to seek immediate emergency care. Continue to monitor your oxygen levels. Call us if you have any questions. Quarantine for 5 days and wear mask for another five days around  others.      - CBC; Future  2. Pure hypercholesterolemia Comments: Chronic, not on statin therapy. Will consider use of cardiac calcium scoring, will discuss further at next visit. - Lipid panel; Future - TSH; Future  3. 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 - CBC; Future  4. Bronchiectasis without complication ([place]) Comments: Chronic, most recent Pulmonary note reviewed. Encouraged to keep f/u Pulmonary appts. Sx are stable with use of Breo inhaler.  5. Vitamin D deficiency disease Comments: I will check a vitamin D level and supplement as needed. She agrees to rto in 2 weeks for lab visit. - CMP14+EGFR; Future - Vitamin D (25 hydroxy); Future  6. Other abnormal glucose Comments: Her a1c has been elevated in the past. I will recheck an a1c. She is encouraged to decrease her intake of sugary beverages and foods. - CMP14+EGFR; Future - Hemoglobin A1c; Future  7. History of anemia Comments: I will check CBC and iron levels. I will forward to her dermatologist, Dr. Ronnald Ramp.  She agrees to come in two weeks for a lab visit. - CBC; Future - Iron, TIBC and Ferritin Panel; Future    COVID-19 Education: The signs and symptoms of COVID-19 were discussed with the patient  and how to seek care for testing (follow up with PCP or arrange E-visit).  The importance of social distancing was discussed today.  Patient Risk:   After full review of this patients clinical status, I feel that they are at least moderate risk at this time.  Time:   Today, I have spent 18 minutes with the patient with telehealth technology discussing above diagnoses.     Medication Adjustments/Labs and Tests Ordered: Current medicines are reviewed at length with the patient today.  Concerns regarding medicines are outlined above.   Tests Ordered: Orders Placed This Encounter  Procedures   CMP14+EGFR   CBC   Lipid panel   Hemoglobin A1c   Vitamin D (25 hydroxy)   Iron, TIBC and Ferritin Panel   TSH    Medication Changes: No orders of the defined types were placed in this encounter.   Disposition:  Follow up in 6 month(s)  Signed, Maximino Greenland, MD

## 2022-07-10 ENCOUNTER — Encounter: Payer: Self-pay | Admitting: Internal Medicine

## 2022-07-10 ENCOUNTER — Ambulatory Visit (INDEPENDENT_AMBULATORY_CARE_PROVIDER_SITE_OTHER): Payer: Medicare Other

## 2022-07-10 VITALS — Ht 62.5 in | Wt 144.0 lb

## 2022-07-10 DIAGNOSIS — Z0000 Encounter for general adult medical examination without abnormal findings: Secondary | ICD-10-CM | POA: Diagnosis not present

## 2022-07-10 NOTE — Progress Notes (Signed)
I connected with Tracey Castro today by telephone and verified that I am speaking with the correct person using two identifiers. Location patient: home Location provider: work Persons participating in the virtual visit: Denilson, Salminen LPN.   I discussed the limitations, risks, security and privacy concerns of performing an evaluation and management service by telephone and the availability of in person appointments. I also discussed with the patient that there may be a patient responsible charge related to this service. The patient expressed understanding and verbally consented to this telephonic visit.    Interactive audio and video telecommunications were attempted between this provider and patient, however failed, due to patient having technical difficulties OR patient did not have access to video capability.  We continued and completed visit with audio only.     Vital signs may be patient reported or missing.  Subjective:   Tracey Castro is a 77 y.o. female who presents for Medicare Annual (Subsequent) preventive examination.  Review of Systems     Cardiac Risk Factors include: advanced age (>54mn, >>82women)     Objective:    Today's Vitals   07/10/22 0943  Weight: 144 lb (65.3 kg)  Height: 5' 2.5" (1.588 m)   Body mass index is 25.92 kg/m.     07/10/2022    9:47 AM 06/28/2021    9:45 AM 08/28/2020    3:24 PM 06/21/2020   10:04 AM 06/09/2019   10:54 AM 06/03/2018   11:59 AM 03/27/2018    7:12 AM  Advanced Directives  Does Patient Have a Medical Advance Directive? _0  Yes Yes  Type of AParamed[place]of [place]Living will [place]Living will Healthcare Power of A[place]Living will HHatfieldLiving will HReaLiving will Living will  Does patient want to make changes to medical advance directive?   No -  Patient declined   No - Patient declined   Copy of H[place] Chart? No - copy requested No - copy requested No - copy requested No - copy requested No - copy requested No - copy requested     Current Medications (verified) Outpatient Encounter Medications as of 07/10/2022  Medication Sig   Cholecalciferol (VITAMIN D) 2000 units tablet Take 4,000-6,000 Units by mouth daily.   diphenhydrAMINE HCl, Sleep, 25 MG CAPS Take 25 mg by mouth daily as needed (sleep).   Ferrous Gluconate-C-Folic Acid (IRON-C PO) Take 65 mg by mouth.   fluticasone furoate-vilanterol (BREO ELLIPTA) 100-25 MCG/INH AEPB Inhale 1 puff into the lungs daily. USE 1 INHALATION DAILY   ibuprofen (ADVIL,MOTRIN) 200 MG tablet Take 600 mg by mouth 2 (two) times daily as needed for headache or moderate pain.   Magnesium 500 MG CAPS Take 500 mg by mouth daily.   molnupiravir EUA (LAGEVRIO) 200 MG CAPS capsule Take 4 capsules (800 mg total) by mouth 2 (two) times daily for 5 days.   Multiple Vitamin (MULTIVITAMIN) capsule Take 1 capsule by mouth daily.   fluticasone (FLONASE) 50 MCG/ACT nasal spray Place 1 spray into both nostrils daily as needed for allergies.   hydrochlorothiazide (MICROZIDE) 12.5 MG capsule TAKE 1 CAPSULE BY MOUTH EVERY DAY AS NEEDED   meloxicam (MOBIC) 15 MG tablet Take 1 tablet (15 mg total) by mouth daily.   No facility-administered encounter medications on file as of 07/10/2022.    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[facility])    Past Surgical History:  Procedure Laterality Date   BREAST CYST ASPIRATION     INCISION AND DRAINAGE Left 08/28/2020   Procedure: INCISION AND DRAINAGE OF LEFT KNEE LACERATION;  Surgeon: Meredith Pel, MD;  Location: [facility];  Service: Orthopedics;  Laterality: Left;   TOTAL HIP ARTHROPLASTY Right 07/05/2021   VIDEO BRONCHOSCOPY Bilateral 03/27/2018    Procedure: VIDEO BRONCHOSCOPY WITHOUT FLUORO;  Surgeon: Tanda Rockers, MD;  Location: [place];  Service: Cardiopulmonary;  Laterality: Bilateral;   Family History  Problem Relation Age of Onset   Cancer - Ovarian Mother    Cancer - Colon Father    Parkinson's disease Father    Social History   Socioeconomic History   Marital status: Married    Spouse name: Not on file   Number of children: Not on file   Years of education: Not on file   Highest education level: Not on file  Occupational History   Occupation: retired  Tobacco Use   Smoking status: Never   Smokeless tobacco: Never  Vaping Use   Vaping Use: Never used  Substance and Sexual Activity   Alcohol use: Yes    Alcohol/week: 4.0 standard drinks of alcohol    Types: 4 Glasses of wine per week   Drug use: Never   Sexual activity: Yes  Other Topics Concern   Not on file  Social History Narrative   ** Merged History Encounter **       Social Determinants of Health   Financial Resource Strain: Low Risk  (07/10/2022)   Overall Financial Resource Strain (CARDIA)    Difficulty of Paying Living Expenses: Not hard at all  Food Insecurity: No Food Insecurity (07/10/2022)   Hunger Vital Sign    Worried About Running Out of Food in the Last Year: Never true    Ran Out of Food in the Last Year: Never true  Transportation Needs: No Transportation Needs (07/10/2022)   PRAPARE - Hydrologist (Medical): No    Lack of Transportation (Non-Medical): No  Physical Activity: Sufficiently Active (07/10/2022)   Exercise Vital Sign    Days of Exercise per Week: 2 days    Minutes of Exercise per Session: 90 min  Stress: No Stress Concern Present (07/10/2022)   [place]    Feeling of Stress : Not at all  Social Connections: Unknown (02/19/2018)   Social Connection and Isolation Panel [NHANES]    Frequency of Communication with Friends and  Family: Not on file    Frequency of Social Gatherings with Friends and Family: Not on file    Attends Religious Services: Not on file    Active Member of Clubs or Organizations: Not on file    Attends Archivist Meetings: Not on file    Marital Status: Married    Tobacco Counseling Counseling given: Not Answered   Clinical Intake:  Pre-visit preparation completed: Yes  Pain : No/denies pain     Nutritional Status: BMI 25 -29 Overweight Nutritional Risks: None Diabetes: No  How often do you need to have someone help you when you read instructions, pamphlets, or other written materials from your doctor or pharmacy?: 1 - Never  Diabetic? no  Interpreter Needed?: No  Information entered by :: NAllen LPN   Activities of Daily Living    07/10/2022    9:47 AM  In your  present state of health, do you have any difficulty performing the following activities:  Hearing? 0  Vision? 0  Difficulty concentrating or making decisions? 0  Walking or climbing stairs? 0  Dressing or bathing? 0  Doing errands, shopping? 0  Preparing Food and eating ? N  Using the Toilet? N  In the past six months, have you accidently leaked urine? Y  Comment with cough  Do you have problems with loss of bowel control? N  Managing your Medications? N  Managing your Finances? N  Housekeeping or managing your Housekeeping? N    Patient Care Team: Glendale Chard, MD as PCP - General (Internal Medicine)  Indicate any recent Medical Services you may have received from other than [facility] providers in the past year (date may be approximate).     Assessment:   This is a routine wellness examination for [place].  Hearing/Vision screen Vision Screening - Comments:: Regular eye exams, Dr. Phineas Douglas  Dietary issues and exercise activities discussed: Current Exercise Habits: Structured exercise class, Type of exercise: yoga;strength training/weights;Other - see comments (elliptical), Time (Minutes): >  60, Frequency (Times/Week): 2, Weekly Exercise (Minutes/Week): 0   Goals Addressed             This Visit's Progress    Patient Stated       07/10/2022, wants to lose weight and get stronger       Depression Screen    07/10/2022    9:47 AM 04/24/2022    2:51 PM 06/28/2021    9:46 AM 06/21/2020   10:05 AM 06/19/2020    2:07 PM 06/09/2019   10:54 AM 11/10/2018   11:07 AM  PHQ 2/9 Scores  PHQ - 2 Score 0 0 0 0 0 0 0  PHQ- 9 Score      3     Fall Risk    07/10/2022    9:47 AM 04/24/2022    2:51 PM 06/28/2021    9:46 AM 06/21/2020   10:05 AM 06/19/2020    2:07 PM  [place] in the past year? 0 0 0 0 0  Number falls in past yr: 0 0   0  Injury with Fall? 0 0   0  Risk for fall due to : Medication side effect No Fall Risks Medication side effect No Fall Risks   Follow up Falls prevention discussed;Education provided;Falls evaluation completed Falls evaluation completed Falls evaluation completed;Education provided;Falls prevention discussed Falls evaluation completed;Education provided;Falls prevention discussed     FALL RISK PREVENTION PERTAINING TO THE HOME:  Any stairs in or around the home? Yes  If so, are there any without handrails? No  Home free of loose throw rugs in walkways, pet beds, electrical cords, etc? Yes  Adequate lighting in your home to reduce risk of falls? Yes   ASSISTIVE DEVICES UTILIZED TO PREVENT FALLS:  Life alert? No  Use of a cane, walker or w/c? No  Grab bars in the bathroom? No  Shower chair or bench in shower? Yes  Elevated toilet seat or a handicapped toilet? Yes   TIMED UP AND GO:  Was the test performed? No .      Cognitive Function:        07/10/2022    9:48 AM 06/28/2021    9:47 AM 06/21/2020   10:07 AM 06/09/2019   10:58 AM  6CIT Screen  What Year? 0 points 0 points 0 points 0 points  What month? 0 points 0 points 0  points 0 points  What time? 0 points 0 points 0 points 0 points  Count back from 20 0 points 0 points 0  points 0 points  Months in reverse 0 points 0 points 0 points 0 points  Repeat phrase 0 points 0 points 2 points 0 points  Total Score 0 points 0 points 2 points 0 points    Immunizations Immunization History  Administered Date(s) Administered   Fluad Quad(high Dose 65+) 03/18/2019, 05/07/2021   Influenza, High Dose Seasonal PF 06/03/2018   Influenza-Unspecified 04/17/2013   PFIZER(Purple Top)SARS-COV-2 Vaccination 07/08/2019, 07/29/2019, 04/18/2020   Pfizer Covid-19 Vaccine Bivalent Booster 28yr & up 03/23/2021, 04/30/2022   Pneumococcal Polysaccharide-23 03/28/2015   Tdap 08/28/2020   Zoster Recombinat (Shingrix) 03/21/2022    TDAP status: Up to date  Flu Vaccine status: Up to date  Pneumococcal vaccine status: Up to date  Covid-19 vaccine status: Completed vaccines  Qualifies for Shingles Vaccine? Yes   Zostavax completed No   Shingrix Completed?: needs second  Screening Tests Health Maintenance  Topic Date Due   Pneumonia Vaccine 78 Years old (2 - PCV) 03/27/2016   INFLUENZA VACCINE  01/15/2022   Zoster Vaccines- Shingrix (2 of 2) 05/16/2022   COVID-19 Vaccine (6 - 2023-24 season) 06/25/2022   Medicare Annual Wellness (AWV)  06/28/2022   DTaP/Tdap/Td (2 - Td or Tdap) 08/29/2030   DEXA SCAN  Completed   Hepatitis C Screening  Completed   HPV VACCINES  Aged Out   COLONOSCOPY (Pts [phone] coverage will need to be confirmed)  Discontinued    Health Maintenance  Health Maintenance Due  Topic Date Due   Pneumonia Vaccine 6555Years old (2 - PCV) 03/27/2016   INFLUENZA VACCINE  01/15/2022   Zoster Vaccines- Shingrix (2 of 2) 05/16/2022   COVID-19 Vaccine (6 - 2023-24 season) 06/25/2022   Medicare Annual Wellness (AWV)  06/28/2022    Colorectal cancer screening: No longer required.   Mammogram status: Completed 01/23/2022. Repeat every year  Bone Density status: Completed 06/13/2017.   Lung Cancer Screening: (Low Dose CT Chest recommended if Age  78-80ears, 30 pack-year currently smoking OR have quit w/in 15years.) does not qualify.   Lung Cancer Screening Referral: no  Additional Screening:  Hepatitis C Screening: does qualify; Completed 06/04/2018  Vision Screening: Recommended annual ophthalmology exams for early detection of glaucoma and other disorders of the eye. Is the patient up to date with their annual eye exam?  Yes  Who is the provider or what is the name of the office in which the patient attends annual eye exams? Dr. PaPhineas Douglasf pt is not established with a provider, would they like to be referred to a provider to establish care? No .   Dental Screening: Recommended annual dental exams for proper oral hygiene  Community Resource Referral / Chronic Care Management: CRR required this visit?  No   CCM required this visit?  No      Plan:     I have personally reviewed and noted the following in the patient's chart:   Medical and social history Use of alcohol, tobacco or illicit drugs  Current medications and supplements including opioid prescriptions. Patient is not currently taking opioid prescriptions. Functional ability and status Nutritional status Physical activity Advanced directives List of other physicians Hospitalizations, surgeries, and ER visits in previous 12 months Vitals Screenings to include cognitive, depression, and falls Referrals and appointments  In addition, I have reviewed and discussed with patient certain preventive protocols, quality  metrics, and best practice recommendations. A written personalized care plan for preventive services as well as general preventive health recommendations were provided to patient.     Kellie Simmering, LPN   9/40/0050   Nurse Notes: none  Due to this being a virtual visit, the after visit summary with patients personalized plan was offered to patient via mail or my-chart.  Patient would like to access on my-chart

## 2022-07-10 NOTE — Patient Instructions (Signed)
Tracey Castro , Thank you for taking time to come for your Medicare Wellness Visit. I appreciate your ongoing commitment to your health goals. Please review the following plan we discussed and let me know if I can assist you in the future.   These are the goals we discussed:  Goals       exercise regularly (pt-stated)      Patient Stated      06/21/2020, wants to exercise more      Patient Stated      06/28/2021, get through surgery and get legs stronger      Patient Stated      07/10/2022, wants to lose weight and get stronger      Weight (lb) < 200 lb (90.7 kg) (pt-stated)      Would like to lose 5 pounds      Weight (lb) < 200 lb (90.7 kg)      06/09/2019, wants to lose 5-10 pounds        This is a list of the screening recommended for you and due dates:  Health Maintenance  Topic Date Due   Pneumonia Vaccine (2 - PCV) 03/27/2016   Flu Shot  01/15/2022   Zoster (Shingles) Vaccine (2 of 2) 05/16/2022   COVID-19 Vaccine (6 - 2023-24 season) 06/25/2022   Medicare Annual Wellness Visit  07/11/2023   DTaP/Tdap/Td vaccine (2 - Td or Tdap) 08/29/2030   DEXA scan (bone density measurement)  Completed   Hepatitis C Screening: USPSTF Recommendation to screen - Ages 33-79 yo.  Completed   HPV Vaccine  Aged Out   Colon Cancer Screening  Discontinued    Advanced directives: Please bring a copy of your POA (Power of Attorney) and/or Living Will to your next appointment.   Conditions/risks identified: none  Next appointment: Follow up in one year for your annual wellness visit    Preventive Care 65 Years and Older, Female Preventive care refers to lifestyle choices and visits with your health care provider that can promote health and wellness. What does preventive care include? A yearly physical exam. This is also called an annual well check. Dental exams once or twice a year. Routine eye exams. Ask your health care provider how often you should have your eyes  checked. Personal lifestyle choices, including: Daily care of your teeth and gums. Regular physical activity. Eating a healthy diet. Avoiding tobacco and drug use. Limiting alcohol use. Practicing safe sex. Taking low-dose aspirin every day. Taking vitamin and mineral supplements as recommended by your health care provider. What happens during an annual well check? The services and screenings done by your health care provider during your annual well check will depend on your age, overall health, lifestyle risk factors, and family history of disease. Counseling  Your health care provider may ask you questions about your: Alcohol use. Tobacco use. Drug use. Emotional well-being. Home and relationship well-being. Sexual activity. Eating habits. History of falls. Memory and ability to understand (cognition). Work and work Statistician. Reproductive health. Screening  You may have the following tests or measurements: Height, weight, and BMI. Blood pressure. Lipid and cholesterol levels. These may be checked every 5 years, or more frequently if you are over 38 years old. Skin check. Lung cancer screening. You may have this screening every year starting at age 4 if you have a 30-pack-year history of smoking and currently smoke or have quit within the past 15 years. Fecal occult blood test (FOBT) of the stool. You may have  this test every year starting at age 17. Flexible sigmoidoscopy or colonoscopy. You may have a sigmoidoscopy every 5 years or a colonoscopy every 10 years starting at age 26. Hepatitis C blood test. Hepatitis B blood test. Sexually transmitted disease (STD) testing. Diabetes screening. This is done by checking your blood sugar (glucose) after you have not eaten for a while (fasting). You may have this done every 1-3 years. Bone density scan. This is done to screen for osteoporosis. You may have this done starting at age 31. Mammogram. This may be done every 1-2  years. Talk to your health care provider about how often you should have regular mammograms. Talk with your health care provider about your test results, treatment options, and if necessary, the need for more tests. Vaccines  Your health care provider may recommend certain vaccines, such as: Influenza vaccine. This is recommended every year. Tetanus, diphtheria, and acellular pertussis (Tdap, Td) vaccine. You may need a Td booster every 10 years. Zoster vaccine. You may need this after age 51. Pneumococcal 13-valent conjugate (PCV13) vaccine. One dose is recommended after age 54. Pneumococcal polysaccharide (PPSV23) vaccine. One dose is recommended after age 8. Talk to your health care provider about which screenings and vaccines you need and how often you need them. This information is not intended to replace advice given to you by your health care provider. Make sure you discuss any questions you have with your health care provider. Document Released: 06/30/2015 Document Revised: 02/21/2016 Document Reviewed: 04/04/2015 Elsevier Interactive Patient Education  2017 [place] Prevention in the Home Falls can cause injuries. They can happen to people of all ages. There are many things you can do to make your home safe and to help prevent falls. What can I do on the outside of my home? Regularly fix the edges of walkways and driveways and fix any cracks. Remove anything that might make you trip as you walk through a door, such as a raised step or threshold. Trim any bushes or trees on the path to your home. Use bright outdoor lighting. Clear any walking paths of anything that might make someone trip, such as rocks or tools. Regularly check to see if handrails are loose or broken. Make sure that both sides of any steps have handrails. Any raised decks and porches should have guardrails on the edges. Have any leaves, snow, or ice cleared regularly. Use sand or salt on walking paths  during winter. Clean up any spills in your garage right away. This includes oil or grease spills. What can I do in the bathroom? Use night lights. Install grab bars by the toilet and in the tub and shower. Do not use towel bars as grab bars. Use non-skid mats or decals in the tub or shower. If you need to sit down in the shower, use a plastic, non-slip stool. Keep the floor dry. Clean up any water that spills on the floor as soon as it happens. Remove soap buildup in the tub or shower regularly. Attach bath mats securely with double-sided non-slip rug tape. Do not have throw rugs and other things on the floor that can make you trip. What can I do in the bedroom? Use night lights. Make sure that you have a light by your bed that is easy to reach. Do not use any sheets or blankets that are too big for your bed. They should not hang down onto the floor. Have a firm chair that has side arms. You  can use this for support while you get dressed. Do not have throw rugs and other things on the floor that can make you trip. What can I do in the kitchen? Clean up any spills right away. Avoid walking on wet floors. Keep items that you use a lot in easy-to-reach places. If you need to reach something above you, use a strong step stool that has a grab bar. Keep electrical cords out of the way. Do not use floor polish or wax that makes floors slippery. If you must use wax, use non-skid floor wax. Do not have throw rugs and other things on the floor that can make you trip. What can I do with my stairs? Do not leave any items on the stairs. Make sure that there are handrails on both sides of the stairs and use them. Fix handrails that are broken or loose. Make sure that handrails are as long as the stairways. Check any carpeting to make sure that it is firmly attached to the stairs. Fix any carpet that is loose or worn. Avoid having throw rugs at the top or bottom of the stairs. If you do have throw  rugs, attach them to the floor with carpet tape. Make sure that you have a light switch at the top of the stairs and the bottom of the stairs. If you do not have them, ask someone to add them for you. What else can I do to help prevent falls? Wear shoes that: Do not have high heels. Have rubber bottoms. Are comfortable and fit you well. Are closed at the toe. Do not wear sandals. If you use a stepladder: Make sure that it is fully opened. Do not climb a closed stepladder. Make sure that both sides of the stepladder are locked into place. Ask someone to hold it for you, if possible. Clearly mark and make sure that you can see: Any grab bars or handrails. First and last steps. Where the edge of each step is. Use tools that help you move around (mobility aids) if they are needed. These include: Canes. Walkers. Scooters. Crutches. Turn on the lights when you go into a dark area. Replace any light bulbs as soon as they burn out. Set up your furniture so you have a clear path. Avoid moving your furniture around. If any of your floors are uneven, fix them. If there are any pets around you, be aware of where they are. Review your medicines with your doctor. Some medicines can make you feel dizzy. This can increase your chance of falling. Ask your doctor what other things that you can do to help prevent falls. This information is not intended to replace advice given to you by your health care provider. Make sure you discuss any questions you have with your health care provider. Document Released: 03/30/2009 Document Revised: 11/09/2015 Document Reviewed: 07/08/2014 Elsevier Interactive Patient Education  2017 [employer].

## 2022-07-22 ENCOUNTER — Encounter: Payer: Medicare Other | Admitting: Internal Medicine

## 2022-07-29 DIAGNOSIS — H903 Sensorineural hearing loss, bilateral: Secondary | ICD-10-CM | POA: Diagnosis not present

## 2022-07-29 DIAGNOSIS — H9313 Tinnitus, bilateral: Secondary | ICD-10-CM | POA: Diagnosis not present

## 2022-07-29 DIAGNOSIS — H838X3 Other specified diseases of inner ear, bilateral: Secondary | ICD-10-CM | POA: Diagnosis not present

## 2022-08-14 ENCOUNTER — Other Ambulatory Visit: Payer: Medicare Other

## 2022-08-14 DIAGNOSIS — D692 Other nonthrombocytopenic purpura: Secondary | ICD-10-CM

## 2022-08-14 DIAGNOSIS — R7309 Other abnormal glucose: Secondary | ICD-10-CM | POA: Diagnosis not present

## 2022-08-14 DIAGNOSIS — E7800 Pure hypercholesterolemia, unspecified: Secondary | ICD-10-CM | POA: Diagnosis not present

## 2022-08-14 DIAGNOSIS — E559 Vitamin D deficiency, unspecified: Secondary | ICD-10-CM | POA: Diagnosis not present

## 2022-08-14 DIAGNOSIS — U071 COVID-19: Secondary | ICD-10-CM | POA: Diagnosis not present

## 2022-08-14 DIAGNOSIS — Z862 Personal history of diseases of the blood and blood-forming organs and certain disorders involving the immune mechanism: Secondary | ICD-10-CM

## 2022-08-15 LAB — CMP14+EGFR
ALT: 18 IU/L (ref 0–32)
AST: 19 IU/L (ref 0–40)
Albumin/Globulin Ratio: 2 (ref 1.2–2.2)
Albumin: 4.1 g/dL (ref 3.8–4.8)
Alkaline Phosphatase: 90 IU/L (ref 44–121)
BUN/Creatinine Ratio: 19 (ref 12–28)
BUN: 13 mg/dL (ref 8–27)
Bilirubin Total: 0.6 mg/dL (ref 0.0–1.2)
CO2: 23 mmol/L (ref 20–29)
Calcium: 9.8 mg/dL (ref 8.7–10.3)
Chloride: 102 mmol/L (ref 96–106)
Creatinine, Ser: 0.69 mg/dL (ref 0.57–1.00)
Globulin, Total: 2.1 g/dL (ref 1.5–4.5)
Glucose: 107 mg/dL — ABNORMAL HIGH (ref 70–99)
Potassium: 4.4 mmol/L (ref 3.5–5.2)
Sodium: 141 mmol/L (ref 134–144)
Total Protein: 6.2 g/dL (ref 6.0–8.5)
eGFR: 89 mL/min/{1.73_m2} (ref >59)

## 2022-08-15 LAB — LIPID PANEL
Chol/HDL Ratio: 3.9 ratio (ref 0.0–4.4)
Cholesterol, Total: 210 mg/dL — ABNORMAL HIGH (ref 100–199)
HDL: 54 mg/dL (ref >39)
LDL Chol Calc (NIH): 135 mg/dL — ABNORMAL HIGH (ref 0–99)
Triglycerides: 120 mg/dL (ref 0–149)
VLDL Cholesterol Cal: 21 mg/dL (ref 5–40)

## 2022-08-15 LAB — TSH: TSH: 3.36 u[IU]/mL (ref 0.450–4.500)

## 2022-08-15 LAB — IRON,TIBC AND FERRITIN PANEL
Ferritin: 135 ng/mL (ref 15–150)
Iron Saturation: 26 % (ref 15–55)
Iron: 69 ug/dL (ref 27–139)
Total Iron Binding Capacity: 269 ug/dL (ref 250–450)
UIBC: 200 ug/dL (ref 118–369)

## 2022-08-15 LAB — CBC
Hematocrit: 37.2 % (ref 34.0–46.6)
Hemoglobin: 12.7 g/dL (ref 11.1–15.9)
MCH: 28.7 pg (ref 26.6–33.0)
MCHC: 34.1 g/dL (ref 31.5–35.7)
MCV: 84 fL (ref 79–97)
Platelets: 368 10*3/uL (ref 150–450)
RBC: 4.43 x10E6/uL (ref 3.77–5.28)
RDW: 13.5 % (ref 11.7–15.4)
WBC: 6.5 10*3/uL (ref 3.4–10.8)

## 2022-08-15 LAB — HEMOGLOBIN A1C
Est. average glucose Bld gHb Est-mCnc: 140 mg/dL
Hgb A1c MFr Bld: 6.5 % — ABNORMAL HIGH (ref 4.8–5.6)

## 2022-08-15 LAB — VITAMIN D 25 HYDROXY (VIT D DEFICIENCY, FRACTURES): Vit D, 25-Hydroxy: 44.6 ng/mL (ref 30.0–100.0)

## 2022-09-06 ENCOUNTER — Telehealth: Payer: Medicare Other | Admitting: Family Medicine

## 2022-09-06 DIAGNOSIS — J019 Acute sinusitis, unspecified: Secondary | ICD-10-CM | POA: Diagnosis not present

## 2022-09-06 DIAGNOSIS — B9689 Other specified bacterial agents as the cause of diseases classified elsewhere: Secondary | ICD-10-CM | POA: Diagnosis not present

## 2022-09-06 MED ORDER — AMOXICILLIN-POT CLAVULANATE 875-125 MG PO TABS: 1.0000 | ORAL_TABLET | Freq: Two times a day (BID) | ORAL | 0 refills | Status: DC

## 2022-09-06 NOTE — Progress Notes (Signed)

## 2022-09-07 ENCOUNTER — Telehealth: Payer: PRIVATE HEALTH INSURANCE | Admitting: Nurse Practitioner

## 2022-09-07 DIAGNOSIS — B9689 Other specified bacterial agents as the cause of diseases classified elsewhere: Secondary | ICD-10-CM

## 2022-09-07 MED ORDER — DOXYCYCLINE HYCLATE 100 MG PO TABS: 100.0000 mg | ORAL_TABLET | Freq: Two times a day (BID) | ORAL | 0 refills | Status: DC

## 2022-09-07 NOTE — Progress Notes (Signed)
I have spent 5 minutes in review of e-visit questionnaire, review and updating patient chart, medical decision making and response to patient.  ° °Chitara Clonch W Itati Brocksmith, NP ° °  °

## 2022-09-07 NOTE — Addendum Note (Signed)
Addended by: Gracy Racer on: 09/07/2022 02:16 PM   Modules accepted: Orders

## 2022-09-24 ENCOUNTER — Ambulatory Visit (INDEPENDENT_AMBULATORY_CARE_PROVIDER_SITE_OTHER): Payer: Medicare Other | Admitting: Internal Medicine

## 2022-09-24 ENCOUNTER — Encounter: Payer: Self-pay | Admitting: Internal Medicine

## 2022-09-24 VITALS — BP 118/74 | HR 82 | Temp 97.9°F | Ht 62.0 in | Wt 150.8 lb

## 2022-09-24 DIAGNOSIS — Z862 Personal history of diseases of the blood and blood-forming organs and certain disorders involving the immune mechanism: Secondary | ICD-10-CM

## 2022-09-24 DIAGNOSIS — R7309 Other abnormal glucose: Secondary | ICD-10-CM | POA: Diagnosis not present

## 2022-09-24 DIAGNOSIS — Z6827 Body mass index (BMI) 27.0-27.9, adult: Secondary | ICD-10-CM

## 2022-09-24 DIAGNOSIS — J449 Chronic obstructive pulmonary disease, unspecified: Secondary | ICD-10-CM | POA: Diagnosis not present

## 2022-09-24 DIAGNOSIS — D692 Other nonthrombocytopenic purpura: Secondary | ICD-10-CM

## 2022-09-24 DIAGNOSIS — E663 Overweight: Secondary | ICD-10-CM | POA: Diagnosis not present

## 2022-09-24 DIAGNOSIS — Z0000 Encounter for general adult medical examination without abnormal findings: Secondary | ICD-10-CM | POA: Diagnosis not present

## 2022-09-24 DIAGNOSIS — E7800 Pure hypercholesterolemia, unspecified: Secondary | ICD-10-CM | POA: Diagnosis not present

## 2022-09-24 DIAGNOSIS — F5101 Primary insomnia: Secondary | ICD-10-CM | POA: Diagnosis not present

## 2022-09-24 NOTE — Patient Instructions (Addendum)
Benefiber  Tart cherry juice  Health Maintenance, Female Adopting a healthy lifestyle and getting preventive care are important in promoting health and wellness. Ask your health care provider about: The right schedule for you to have regular tests and exams. Things you can do on your own to prevent diseases and keep yourself healthy. What should I know about diet, weight, and exercise? Eat a healthy diet  Eat a diet that includes plenty of vegetables, fruits, low-fat dairy products, and lean protein. Do not eat a lot of foods that are high in solid fats, added sugars, or sodium. Maintain a healthy weight Body mass index (BMI) is used to identify weight problems. It estimates body fat based on height and weight. Your health care provider can help determine your BMI and help you achieve or maintain a healthy weight. Get regular exercise Get regular exercise. This is one of the most important things you can do for your health. Most adults should: Exercise for at least 150 minutes each week. The exercise should increase your heart rate and make you sweat (moderate-intensity exercise). Do strengthening exercises at least twice a week. This is in addition to the moderate-intensity exercise. Spend less time sitting. Even light physical activity can be beneficial. Watch cholesterol and blood lipids Have your blood tested for lipids and cholesterol at 78 years of age, then have this test every 5 years. Have your cholesterol levels checked more often if: Your lipid or cholesterol levels are high. You are older than 78 years of age. You are at high risk for heart disease. What should I know about cancer screening? Depending on your health history and family history, you may need to have cancer screening at various ages. This may include screening for: Breast cancer. Cervical cancer. Colorectal cancer. Skin cancer. Lung cancer. What should I know about heart disease, diabetes, and high blood  pressure? Blood pressure and heart disease High blood pressure causes heart disease and increases the risk of stroke. This is more likely to develop in people who have high blood pressure readings or are overweight. Have your blood pressure checked: Every 3-5 years if you are 64-5 years of age. Every year if you are 62 years old or older. Diabetes Have regular diabetes screenings. This checks your fasting blood sugar level. Have the screening done: Once every three years after age 68 if you are at a normal weight and have a low risk for diabetes. More often and at a younger age if you are overweight or have a high risk for diabetes. What should I know about preventing infection? Hepatitis B If you have a higher risk for hepatitis B, you should be screened for this virus. Talk with your health care provider to find out if you are at risk for hepatitis B infection. Hepatitis C Testing is recommended for: Everyone born from [birth year] through 1965. Anyone with known risk factors for hepatitis C. Sexually transmitted infections (STIs) Get screened for STIs, including gonorrhea and chlamydia, if: You are sexually active and are younger than 78 years of age. You are older than 78 years of age and your health care provider tells you that you are at risk for this type of infection. Your sexual activity has changed since you were last screened, and you are at increased risk for chlamydia or gonorrhea. Ask your health care provider if you are at risk. Ask your health care provider about whether you are at high risk for HIV. Your health care provider may recommend a  prescription medicine to help prevent HIV infection. If you choose to take medicine to prevent HIV, you should first get tested for HIV. You should then be tested every 3 months for as long as you are taking the medicine. Pregnancy If you are about to stop having your period (premenopausal) and you may become pregnant, seek counseling before you  get pregnant. Take 400 to 800 micrograms (mcg) of folic acid every day if you become pregnant. Ask for birth control (contraception) if you want to prevent pregnancy. Osteoporosis and menopause Osteoporosis is a disease in which the bones lose minerals and strength with aging. This can result in bone fractures. If you are 36 years old or older, or if you are at risk for osteoporosis and fractures, ask your health care provider if you should: Be screened for bone loss. Take a calcium or vitamin D supplement to lower your risk of fractures. Be given hormone replacement therapy (HRT) to treat symptoms of menopause. Follow these instructions at home: Alcohol use Do not drink alcohol if: Your health care provider tells you not to drink. You are pregnant, may be pregnant, or are planning to become pregnant. If you drink alcohol: Limit how much you have to: 0-1 drink a day. Know how much alcohol is in your drink. In the [place] one drink equals one 12 oz bottle of beer (355 mL), one 5 oz glass of wine (148 mL), or one 1 oz glass of hard liquor (44 mL). Lifestyle Do not use any products that contain nicotine or tobacco. These products include cigarettes, chewing tobacco, and vaping devices, such as e-cigarettes. If you need help quitting, ask your health care provider. Do not use street drugs. Do not share needles. Ask your health care provider for help if you need support or information about quitting drugs. General instructions Schedule regular health, dental, and eye exams. Stay current with your vaccines. Tell your health care provider if: You often feel depressed. You have ever been abused or do not feel safe at home. Summary Adopting a healthy lifestyle and getting preventive care are important in promoting health and wellness. Follow your health care provider's instructions about healthy diet, exercising, and getting tested or screened for diseases. Follow your health care provider's  instructions on monitoring your cholesterol and blood pressure. This information is not intended to replace advice given to you by your health care provider. Make sure you discuss any questions you have with your health care provider. Document Revised: 10/23/2020 Document Reviewed: 10/23/2020 Elsevier Patient Education  [place].

## 2022-09-24 NOTE — Progress Notes (Signed)
I,Victoria T Hamilton,acting as a scribe for Gwynneth Alimentobyn N Rishaan Gunner, MD.,have documented all relevant documentation on the behalf of Gwynneth Alimentobyn N Brennden Masten, MD,as directed by  Gwynneth Alimentobyn N Ben Sanz, MD while in the presence of Gwynneth Alimentobyn N Challen Spainhour, MD.   Subjective:     Patient ID: Tracey DickKaren A Castro , female    DOB: 04/21/1946 , 77 y.o.   MRN: [id number]   Chief Complaint  Patient presents with   Annual Exam   Hyperlipidemia    HPI  Patient presents today for annual exam. She is no longer followed by GYN. Previous pelvic exam was performed here at the office. She reports compliance with medications.   She reports she is not sleeping well throughout the night. She always wakes up in the middle of the night. She is not always able to go back to sleep. She often gets up and starts baking.      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Family History  Problem Relation Age of Onset   Cancer - Ovarian Mother    Cancer - Colon Father    Parkinson's disease Father      Current Outpatient Medications:    Cholecalciferol (VITAMIN D) 2000 units tablet, Take 4,000-6,000 Units by mouth daily., Disp: , Rfl:    diphenhydrAMINE HCl, Sleep, 25 MG CAPS, Take 25 mg by mouth daily as needed (sleep)., Disp: , Rfl:    Ferrous Gluconate-C-Folic Acid (IRON-C PO), Take 65 mg by mouth., Disp: , Rfl:    fluticasone furoate-vilanterol (BREO ELLIPTA) 100-25 MCG/INH AEPB, Inhale 1 puff into the lungs daily. USE 1 INHALATION DAILY, Disp: 180 each, Rfl: 1   ibuprofen (ADVIL,MOTRIN) 200 MG tablet, Take 600 mg by mouth 2 (two) times daily as needed for headache or moderate pain., Disp: , Rfl:    Magnesium 500 MG CAPS, Take 500 mg by mouth daily., Disp: , Rfl:    Multiple Vitamin (MULTIVITAMIN) capsule, Take 1 capsule by mouth daily., Disp: , Rfl:    Allergies  Allergen Reactions   Augmentin [Amoxicillin-Pot Clavulanate] Nausea And Vomiting   Sulfamethoxazole Rash      The  patient states she uses status post hysterectomy for birth control. Last LMP was No LMP recorded. Patient has had a hysterectomy.. Negative for Dysmenorrhea. Negative for: breast discharge, breast lump(s), breast pain and breast self exam. Associated symptoms include abnormal vaginal bleeding. Pertinent negatives include abnormal bleeding (hematology), anxiety, decreased libido, depression, difficulty falling sleep, dyspareunia, history of infertility, nocturia, sexual dysfunction, sleep disturbances, urinary incontinence, urinary urgency, vaginal discharge and vaginal itching. Diet regular.The patient states her exercise level is  moderate.  . The patient's tobacco use is:  Social History   Tobacco Use  Smoking Status Never  Smokeless Tobacco Never  . She has been exposed to passive smoke. The patient's alcohol use is:  Social History   Substance and Sexual Activity  Alcohol Use Yes   Alcohol/week: 4.0 standard drinks of alcohol   Types: 4 Glasses of wine per week   Review of Systems  Constitutional: Negative.   HENT: Negative.    Eyes: Negative.   Respiratory: Negative.    Cardiovascular: Negative.   Gastrointestinal: Negative.   Endocrine: Negative.   Genitourinary: Negative.   Musculoskeletal: Negative.   Skin:  Positive for rash.       She adds, noticing red circles on her arms when she does not take her iron supplement.   Allergic/Immunologic: Negative.  Neurological: Negative.   Hematological: Negative.   Psychiatric/Behavioral:  Positive for sleep disturbance.      Today's Vitals   09/24/22 1514  BP: 118/74  Pulse: 82  Temp: 97.9 F (36.6 C)  SpO2: 98%  Weight: 150 lb 12.8 oz (68.4 kg)  Height: 5\' 2"  (1.575 m)   Body mass index is 27.58 kg/m.  Wt Readings from Last 3 Encounters:  09/24/22 150 lb 12.8 oz (68.4 kg)  07/10/22 144 lb (65.3 kg)  05/02/22 145 lb (65.8 kg)    Objective:  Physical Exam Vitals and nursing note reviewed.  Constitutional:       Appearance: Normal appearance.  HENT:     Head: Normocephalic and atraumatic.     Right Ear: Tympanic membrane, ear canal and external ear normal.     Left Ear: Tympanic membrane, ear canal and external ear normal.     Nose:     Comments: Masked     Mouth/Throat:     Comments: Masked  Eyes:     Extraocular Movements: Extraocular movements intact.     Conjunctiva/sclera: Conjunctivae normal.     Pupils: Pupils are equal, round, and reactive to light.  Cardiovascular:     Rate and Rhythm: Normal rate and regular rhythm.     Pulses: Normal pulses.     Heart sounds: Normal heart sounds.  Pulmonary:     Effort: Pulmonary effort is normal.     Breath sounds: Normal breath sounds.  Chest:  Breasts:    Tanner Score is 5.     Right: Normal.     Left: Normal.  Abdominal:     General: Abdomen is flat. Bowel sounds are normal.     Palpations: Abdomen is soft.  Genitourinary:    Comments: deferred Musculoskeletal:        General: Normal range of motion.     Cervical back: Normal range of motion and neck supple.  Skin:    General: Skin is warm and dry.  Neurological:     General: No focal deficit present.     Mental Status: She is alert and oriented to person, place, and time.  Psychiatric:        Mood and Affect: Mood normal.        Behavior: Behavior normal.     Assessment And Plan:     1. Encounter for annual health examination Comments: A full exam was performed. Importance of monthly self breast exams was discussed with the patient.  She prefers to have her labs drawn next weekl. I will place orders today. PATIENT IS ADVISED TO GET 30-45 MINUTES REGULAR EXERCISE NO LESS THAN FOUR TO FIVE DAYS PER WEEK - BOTH WEIGHTBEARING EXERCISES AND AEROBIC ARE RECOMMENDED.  PATIENT IS ADVISED TO FOLLOW A HEALTHY DIET WITH AT LEAST SIX FRUITS/VEGGIES PER DAY, DECREASE INTAKE OF RED MEAT, AND TO INCREASE FISH INTAKE TO TWO DAYS PER WEEK.  MEATS/FISH SHOULD NOT BE FRIED, BAKED OR BROILED IS  PREFERABLE.  IT IS ALSO IMPORTANT TO CUT BACK ON YOUR SUGAR INTAKE. PLEASE AVOID ANYTHING WITH ADDED SUGAR, CORN SYRUP OR OTHER SWEETENERS. IF YOU MUST USE A SWEETENER, YOU CAN TRY STEVIA. IT IS ALSO IMPORTANT TO AVOID ARTIFICIALLY SWEETENERS AND DIET BEVERAGES. LASTLY, I SUGGEST WEARING SPF 50 SUNSCREEN ON EXPOSED PARTS AND ESPECIALLY WHEN IN THE DIRECT SUNLIGHT FOR AN EXTENDED PERIOD OF TIME.  PLEASE AVOID FAST FOOD RESTAURANTS AND INCREASE YOUR WATER INTAKE.  2. Pure hypercholesterolemia Comments: Chronic, cardiac calcium scoring performed Sept  2023 results reviewed. CAC score was ZERO. She is encouraged to increase her fiber intake and aim for at least 150 minutes of exercise/week.   3. COPD mixed type Comments: Chronic, most recent Pulmonary notes reviewed in detail.  She will c/w Breo inhaler.  4. Purpura Comments: Possibly related to her anemia.  5. Primary insomnia Comments: Chronic, encouraged to create a bedtime routine. She is encouraged to try tart cherry juice, nightly Mg supplementation.  May consider rx trazodone to use nightly prn if her sx persist.   6. Other abnormal glucose Comments: Previous labs reviewed, her a1c has been elevated in the past. She is encouraged to limit her intake of sugary beverages/foods. - CMP14+EGFR; Future - Hemoglobin A1c; Future  7. Overweight with body mass index (BMI) of 27 to 27.9 in adult Comments: She is encouraged to aim for at least 150 minutes of exercise/week. Her BMI is acceptable for her demographic.  8. History of anemia - CBC; Future - Iron, TIBC and Ferritin Panel; Future  Patient was given opportunity to ask questions. Patient verbalized understanding of the plan and was able to repeat key elements of the plan. All questions were answered to their satisfaction.   I, Gwynneth Aliment, MD, have reviewed all documentation for this visit. The documentation on 09/30/22 for the exam, diagnosis, procedures, and orders are all accurate  and complete.   THE PATIENT IS ENCOURAGED TO PRACTICE SOCIAL DISTANCING DUE TO THE COVID-19 PANDEMIC.

## 2022-09-25 ENCOUNTER — Encounter: Payer: Self-pay | Admitting: Internal Medicine

## 2022-11-08 IMAGING — MG MM DIGITAL DIAGNOSTIC UNILAT*L* W/ TOMO W/ CAD
4 series · 4 of 12 positions shown · non-contrast
Comparison: Previous exam(s).

CLINICAL DATA: 74-year-old female recalled from screening mammogram
dated 11/01/2020 for possible left breast distortion.

EXAM:
DIGITAL DIAGNOSTIC UNILATERAL LEFT MAMMOGRAM WITH TOMOSYNTHESIS AND
CAD
TECHNIQUE: Left digital diagnostic mammography and breast tomosynthesis was
performed. The images were evaluated with computer-aided detection.

[L ML synth-2D]
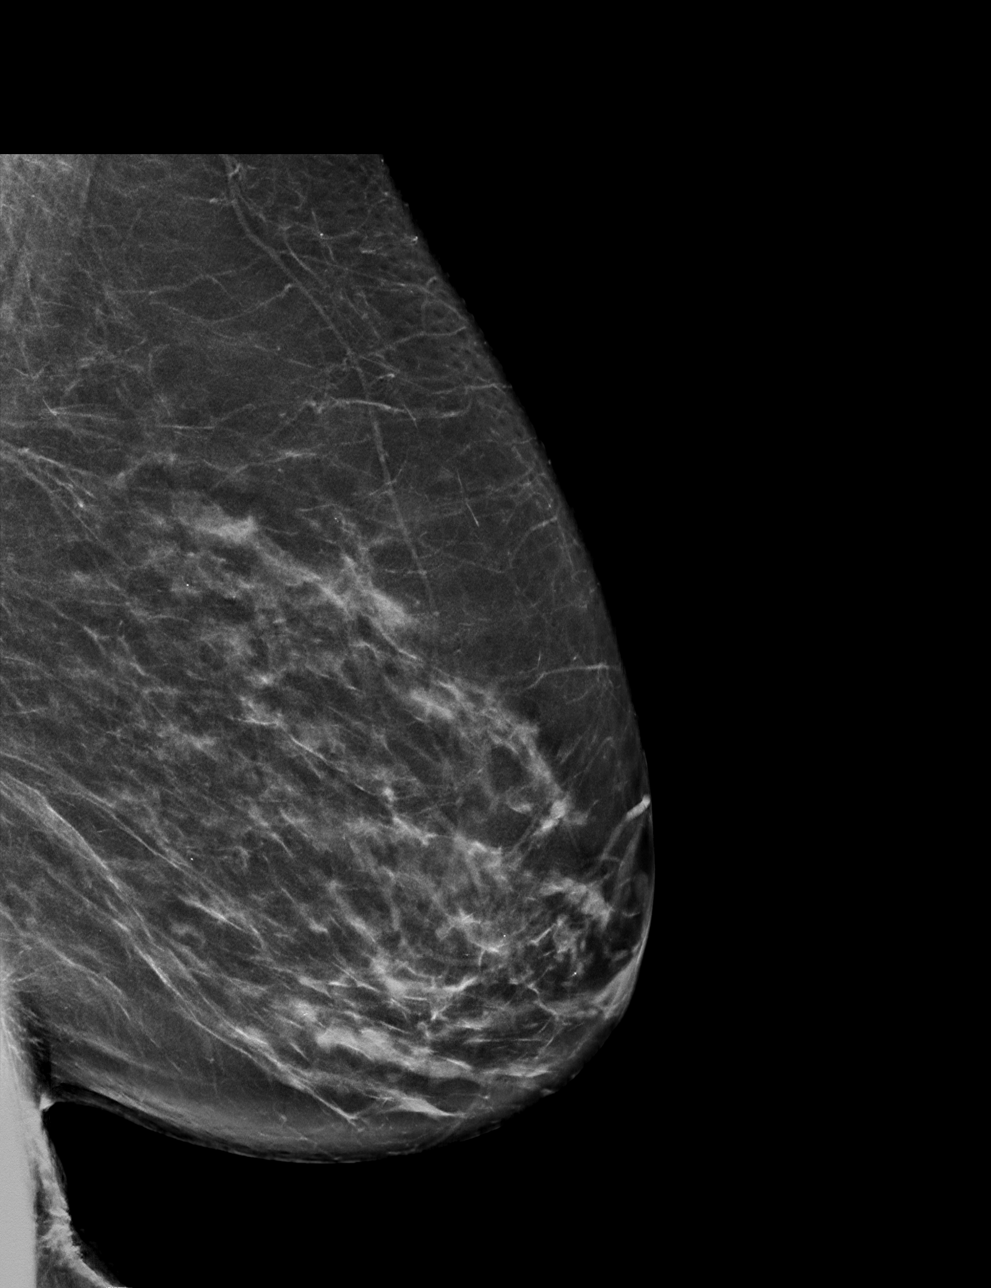

[L CC synth-2D]
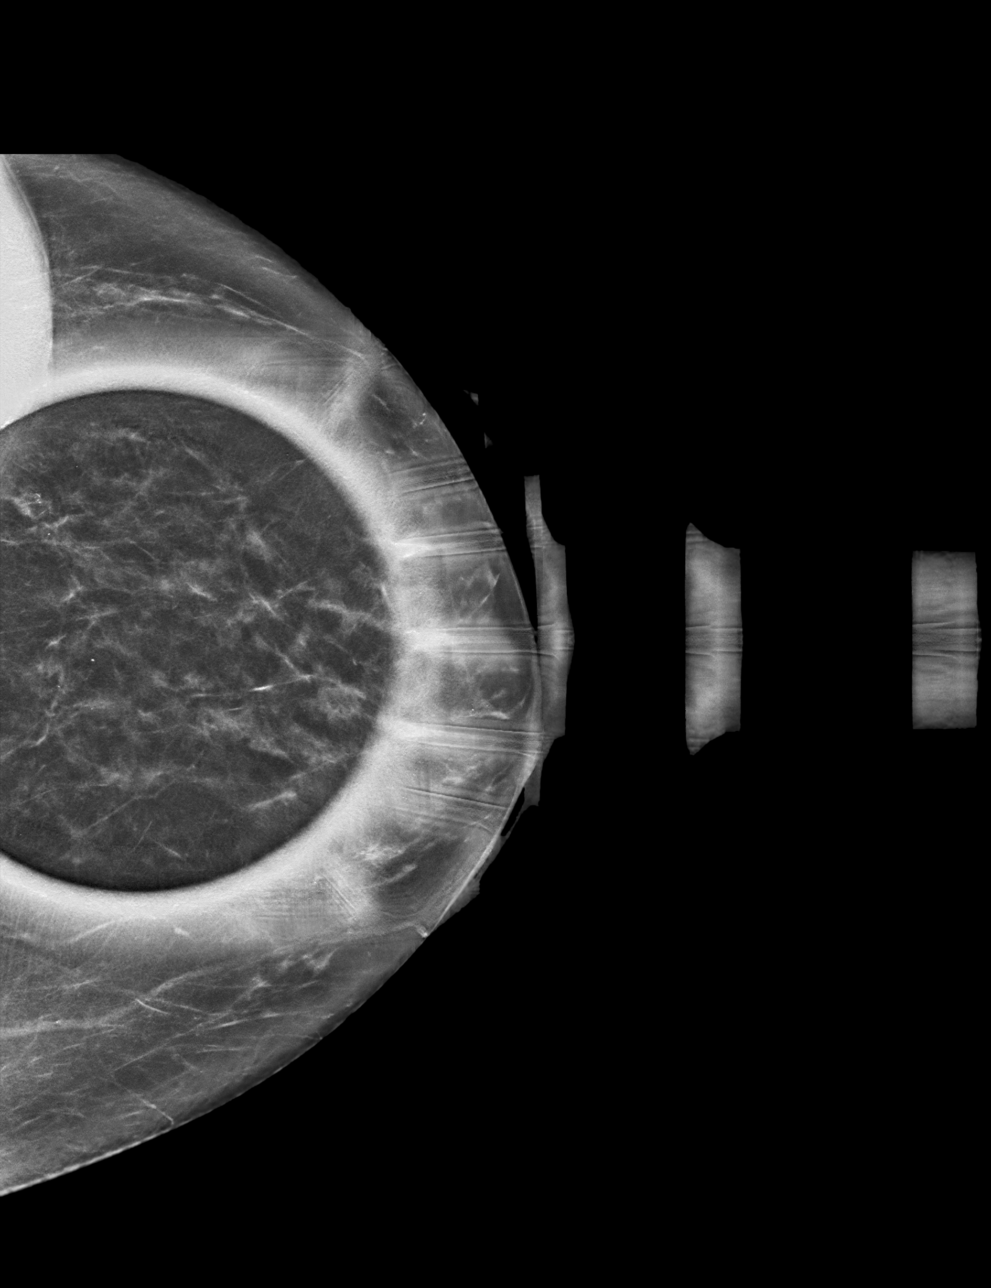

[L ML tomo · tomo slice 34/67.0]
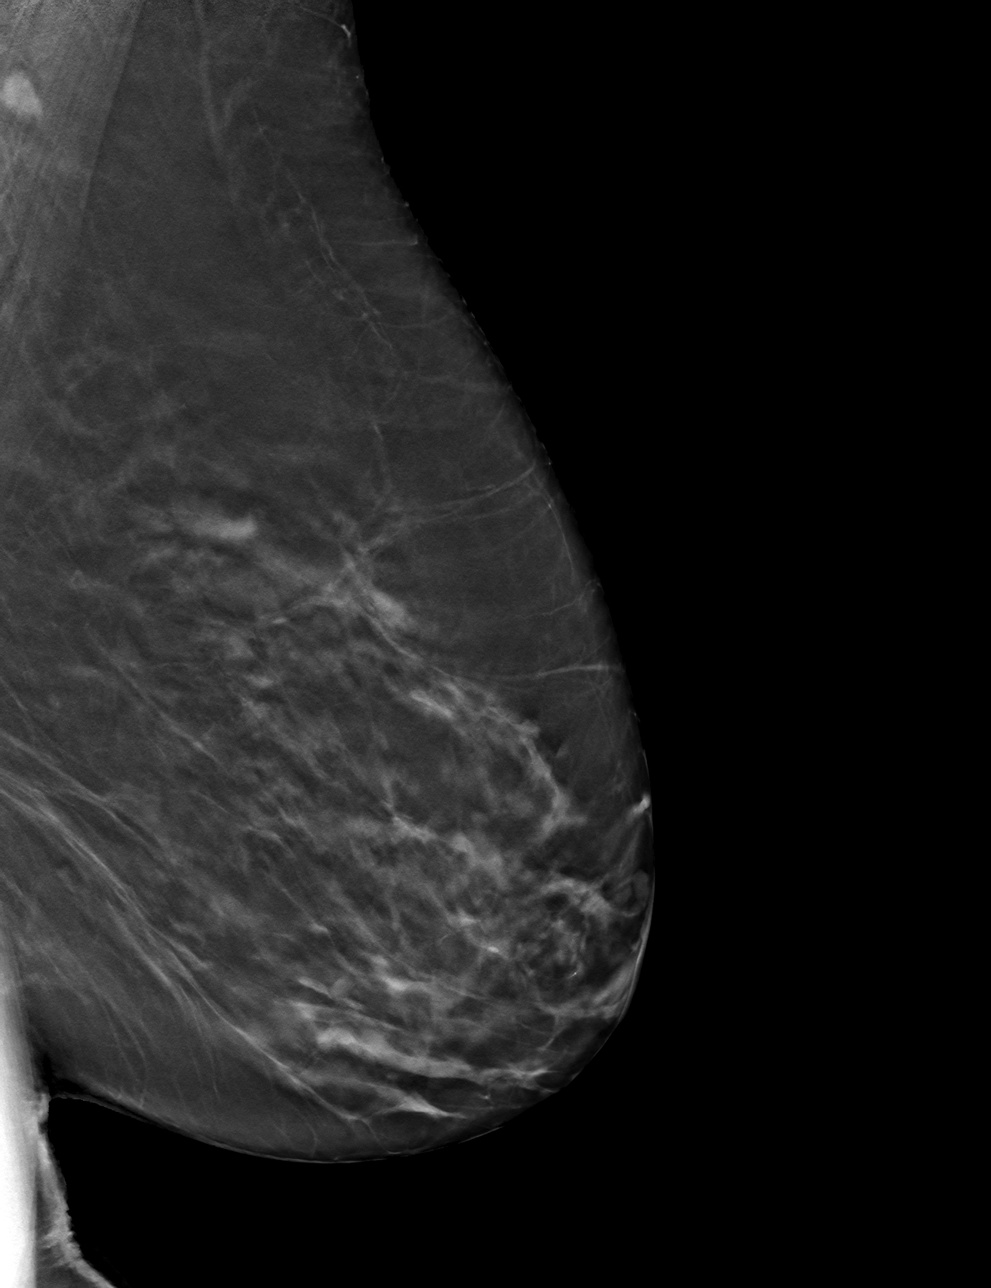

[L CC tomo · tomo slice 28/55.0]
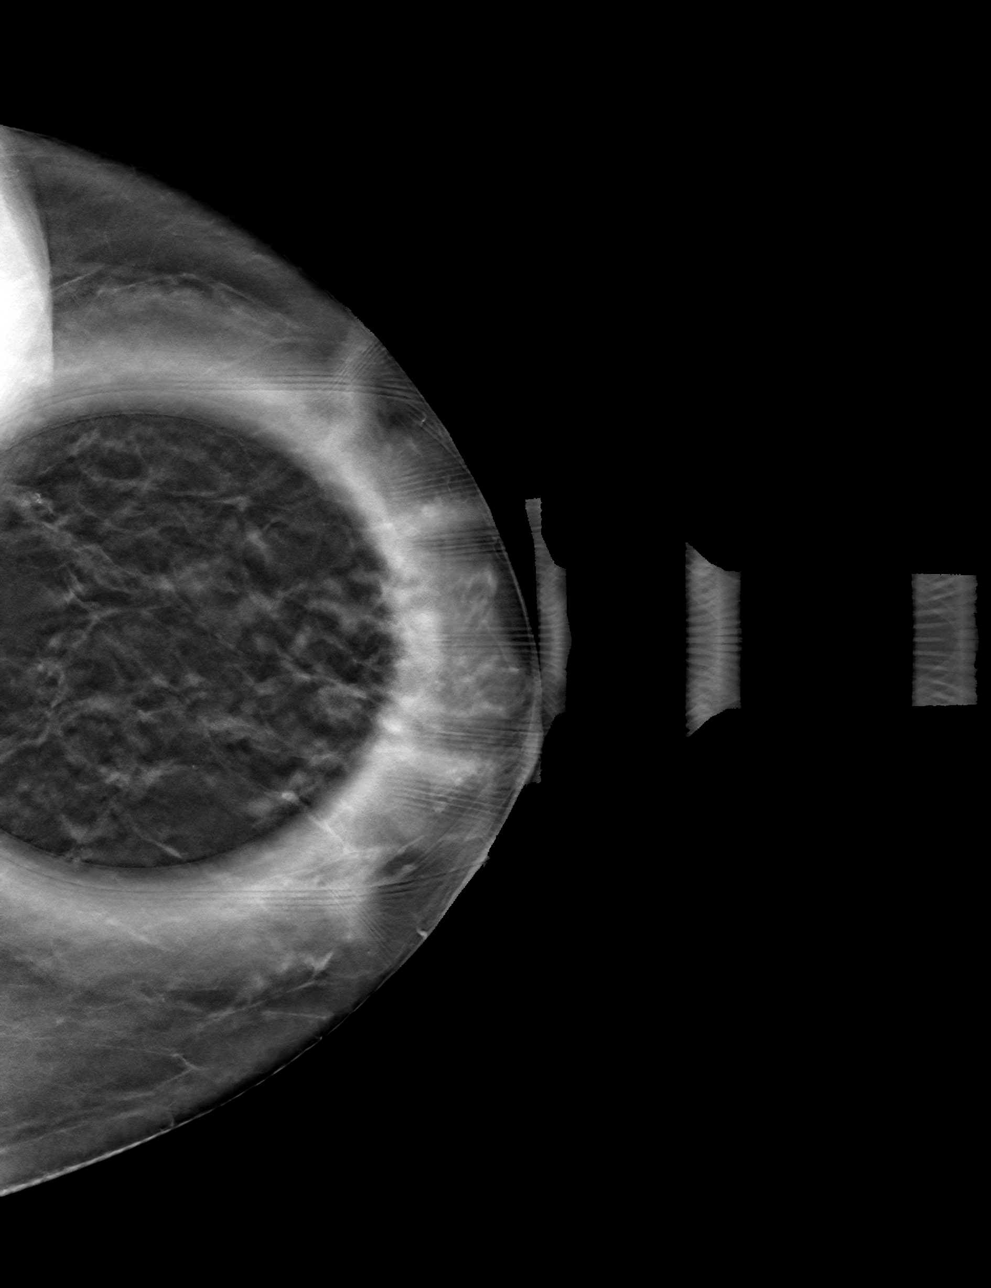

[4 of 12 positions shown; findings below may reference images not displayed]

ACR Breast Density Category b: There are scattered areas of
fibroglandular density.
FINDINGS: Previously described, possible distortion in the central left breast
at middle depth is seen on the cc projection only resolves into well
dispersed fibroglandular tissue on today's additional views. No
suspicious findings are identified.
IMPRESSION: No mammographic evidence of malignancy.

RECOMMENDATION:
Screening mammogram in one year.(Code:LS-[id number])

I have discussed the findings and recommendations with the patient.
If applicable, a reminder letter will be sent to the patient
regarding the next appointment.

BI-RADS CATEGORY  1: Negative.

## 2022-12-03 ENCOUNTER — Other Ambulatory Visit: Payer: Medicare Other

## 2022-12-03 DIAGNOSIS — R7309 Other abnormal glucose: Secondary | ICD-10-CM

## 2022-12-03 DIAGNOSIS — Z862 Personal history of diseases of the blood and blood-forming organs and certain disorders involving the immune mechanism: Secondary | ICD-10-CM

## 2022-12-03 IMAGING — DX DG CHEST 1V
1 series · 1 of 1 positions shown · non-contrast
Comparison: 09/06/2019, CT from 12/01/2020

CLINICAL DATA: Peripheral edema

EXAM:
CHEST  1 VIEW

[chest pa]
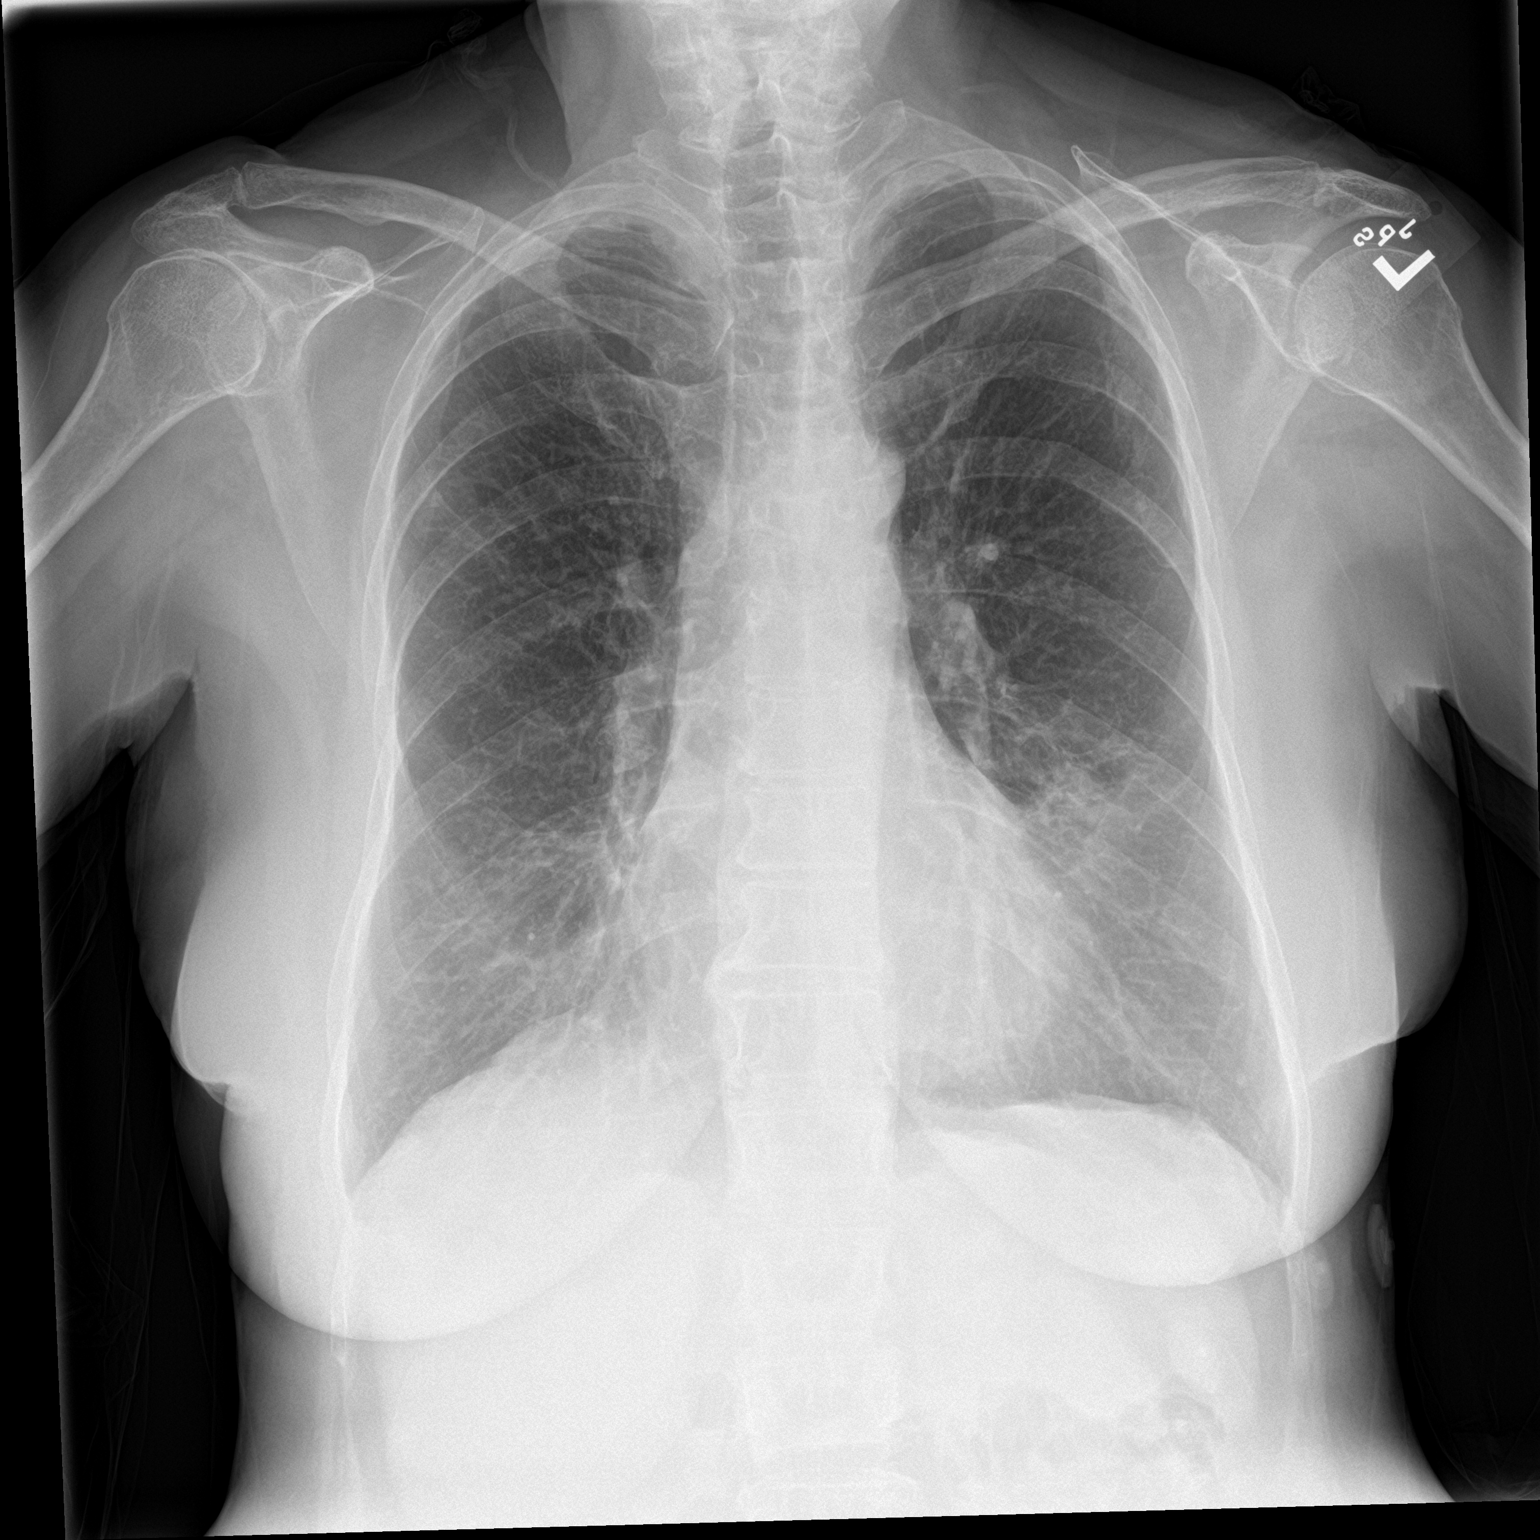

[1 of 1 positions shown; findings below may reference images not displayed]

FINDINGS: Cardiac shadow is within normal limits. Lungs are well aerated
bilaterally. Stable lingular and right middle lobe scarring and
bronchiectasis. No acute infiltrate is seen. No effusion is noted.
No bony abnormality is noted.
IMPRESSION: Stable scarring in the right middle lobe and lingula. No acute
abnormality noted.

## 2022-12-04 LAB — IRON,TIBC AND FERRITIN PANEL
Ferritin: 136 ng/mL (ref 15–150)
Iron Saturation: 20 % (ref 15–55)
Iron: 61 ug/dL (ref 27–139)
Total Iron Binding Capacity: 301 ug/dL (ref 250–450)
UIBC: 240 ug/dL (ref 118–369)

## 2022-12-04 LAB — CBC
Hematocrit: 41.2 % (ref 34.0–46.6)
Hemoglobin: 13.8 g/dL (ref 11.1–15.9)
MCH: 28.6 pg (ref 26.6–33.0)
MCHC: 33.5 g/dL (ref 31.5–35.7)
MCV: 86 fL (ref 79–97)
Platelets: 336 10*3/uL (ref 150–450)
RBC: 4.82 x10E6/uL (ref 3.77–5.28)
RDW: 13.2 % (ref 11.7–15.4)
WBC: 6.6 10*3/uL (ref 3.4–10.8)

## 2022-12-04 LAB — CMP14+EGFR
ALT: 18 IU/L (ref 0–32)
AST: 14 IU/L (ref 0–40)
Albumin: 4.4 g/dL (ref 3.8–4.8)
Alkaline Phosphatase: 110 IU/L (ref 44–121)
BUN/Creatinine Ratio: 23 (ref 12–28)
BUN: 17 mg/dL (ref 8–27)
Bilirubin Total: 0.7 mg/dL (ref 0.0–1.2)
CO2: 22 mmol/L (ref 20–29)
Calcium: 9.6 mg/dL (ref 8.7–10.3)
Chloride: 102 mmol/L (ref 96–106)
Creatinine, Ser: 0.75 mg/dL (ref 0.57–1.00)
Globulin, Total: 2.4 g/dL (ref 1.5–4.5)
Glucose: 106 mg/dL — ABNORMAL HIGH (ref 70–99)
Potassium: 4.4 mmol/L (ref 3.5–5.2)
Sodium: 138 mmol/L (ref 134–144)
Total Protein: 6.8 g/dL (ref 6.0–8.5)
eGFR: 82 mL/min/{1.73_m2} (ref >59)

## 2022-12-04 LAB — HEMOGLOBIN A1C
Est. average glucose Bld gHb Est-mCnc: 131 mg/dL
Hgb A1c MFr Bld: 6.2 % — ABNORMAL HIGH (ref 4.8–5.6)

## 2022-12-05 ENCOUNTER — Encounter (HOSPITAL_BASED_OUTPATIENT_CLINIC_OR_DEPARTMENT_OTHER): Payer: Self-pay | Admitting: Emergency Medicine

## 2022-12-05 ENCOUNTER — Other Ambulatory Visit: Payer: Self-pay

## 2022-12-05 ENCOUNTER — Emergency Department (HOSPITAL_BASED_OUTPATIENT_CLINIC_OR_DEPARTMENT_OTHER)
Admission: EM | Admit: 2022-12-05 | Discharge: 2022-12-05 | Disposition: A | Discharge status: Home / Self Care | Payer: Medicare Other | Attending: Emergency Medicine | Admitting: Emergency Medicine

## 2022-12-05 ENCOUNTER — Emergency Department (HOSPITAL_BASED_OUTPATIENT_CLINIC_OR_DEPARTMENT_OTHER): Payer: Medicare Other | Admitting: Radiology

## 2022-12-05 DIAGNOSIS — J449 Chronic obstructive pulmonary disease, unspecified: Secondary | ICD-10-CM | POA: Diagnosis not present

## 2022-12-05 DIAGNOSIS — R0789 Other chest pain: Secondary | ICD-10-CM | POA: Diagnosis not present

## 2022-12-05 DIAGNOSIS — S2231XA Fracture of one rib, right side, initial encounter for closed fracture: Secondary | ICD-10-CM | POA: Insufficient documentation

## 2022-12-05 DIAGNOSIS — W1830XA Fall on same level, unspecified, initial encounter: Secondary | ICD-10-CM | POA: Insufficient documentation

## 2022-12-05 DIAGNOSIS — S20309A Unspecified superficial injuries of unspecified front wall of thorax, initial encounter: Secondary | ICD-10-CM | POA: Diagnosis present

## 2022-12-05 MED ORDER — ACETAMINOPHEN 325 MG PO TABS: 650.0000 mg | ORAL_TABLET | Freq: Four times a day (QID) | ORAL | 0 refills | Status: DC | PRN

## 2022-12-05 MED ORDER — LIDOCAINE 5 % EX PTCH
1.0000 | MEDICATED_PATCH | Freq: Once | CUTANEOUS | Status: DC
  Administered 2022-12-05: 1 via TRANSDERMAL
  Filled 2022-12-05: qty 1

## 2022-12-05 MED ORDER — OXYCODONE HCL 5 MG PO TABS: 5.0000 mg | ORAL_TABLET | ORAL | 0 refills | Status: DC | PRN

## 2022-12-05 MED ORDER — LIDOCAINE 5 % EX PTCH: 1.0000 | MEDICATED_PATCH | Freq: Every day | CUTANEOUS | 0 refills | Status: DC | PRN

## 2022-12-05 MED ORDER — TIZANIDINE HCL 2 MG PO TABS: 2.0000 mg | ORAL_TABLET | Freq: Three times a day (TID) | ORAL | 0 refills | Status: AC | PRN

## 2022-12-05 MED ORDER — OXYCODONE-ACETAMINOPHEN 5-325 MG PO TABS
1.0000 | ORAL_TABLET | Freq: Once | ORAL | Status: AC
  Administered 2022-12-05: 1 via ORAL
  Filled 2022-12-05: qty 1

## 2022-12-05 NOTE — ED Notes (Signed)
Discharge paperwork given and verbally understood. 

## 2022-12-05 NOTE — ED Provider Notes (Signed)
[place] EMERGENCY DEPARTMENT AT [facility] Provider Note  CSN: [id number] Arrival date & time: 12/05/22 4098  Chief Complaint(s) Fall  HPI Tracey Castro is a 77 y.o. female with past medical history as below, significant for HLD, MAC, COPD who presents to the ED with complaint of fall, right-sided rib pain.  Patient accompanied by spouse.  Patient reports that 3 days ago she was in her yard and slipped on the wet grass and fell onto a piece of patio furniture to her right axilla.  No head injury, no LOC, she was amatory after the event.  No pain to extremities.  She is having progressively worsening pain to her right axilla since the fall.  Minimal improvement with NSAIDs.  No difficulty breathing, no chest pain otherwise.  No nausea or vomiting.  No fevers or chills.  No change in bowel or bladder function.  No blood thinners.  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 Authorizing Provider  acetaminophen (TYLENOL) 325 MG tablet Take 2 tablets (650 mg total) by mouth every 6 (six) hours as needed. 12/05/22  Yes Tanda Rockers A, DO  lidocaine (LIDODERM) 5 % Place 1 patch onto the skin daily as needed. Remove & Discard patch within 12 hours or as directed by MD 12/05/22   Yes Tanda Rockers A, DO  oxyCODONE (ROXICODONE) 5 MG immediate release tablet Take 1 tablet (5 mg total) by mouth every 4 (four) hours as needed for severe pain. 12/05/22  Yes Tanda Rockers A, DO  tiZANidine (ZANAFLEX) 2 MG tablet Take 1 tablet (2 mg total) by mouth every 8 (eight) hours as needed for up to 4 days for muscle spasms. 12/05/22 12/09/22 Yes Sloan Leiter, DO  Cholecalciferol (VITAMIN D) 2000 units tablet Take 4,000-6,000 Units by mouth daily.    [provider]  diphenhydrAMINE HCl, Sleep, 25 MG CAPS Take 25 mg by mouth daily as needed (sleep).    [provider]  Ferrous Gluconate-C-Folic Acid (IRON-C PO) Take 65 mg by mouth.    [provider]  fluticasone furoate-vilanterol (BREO ELLIPTA) 100-25 MCG/INH AEPB Inhale 1 puff into the lungs daily. USE 1 INHALATION DAILY 03/07/21   Oretha Milch, MD  ibuprofen (ADVIL,MOTRIN) 200 MG tablet Take 600 mg by mouth 2 (two) times daily as needed for headache or moderate pain.    [provider]  Magnesium 500 MG CAPS Take 500 mg by mouth daily.    [provider]  Multiple Vitamin (MULTIVITAMIN) capsule Take 1 capsule by mouth daily.    [provider]  Past Surgical History Past Surgical History:  Procedure Laterality Date   ABDOMINAL HYSTERECTOMY     BREAST CYST ASPIRATION     INCISION AND DRAINAGE Left 08/28/2020   Procedure: INCISION AND DRAINAGE OF LEFT KNEE LACERATION;  Surgeon: Cammy Copa, MD;  Location: [facility] ORS;  Service: Orthopedics;  Laterality: Left;   TOTAL HIP ARTHROPLASTY Right 07/05/2021   VIDEO BRONCHOSCOPY Bilateral 03/27/2018   Procedure: VIDEO BRONCHOSCOPY WITHOUT FLUORO;  Surgeon: Nyoka Cowden, MD;  Location: [facility] ENDOSCOPY;  Service: Cardiopulmonary;  Laterality: Bilateral;   Family History Family History  Problem Relation Age of  Onset   Cancer - Ovarian Mother    Cancer - Colon Father    Parkinson's disease Father     Social History Social History   Tobacco Use   Smoking status: Never   Smokeless tobacco: Never  Vaping Use   Vaping Use: Never used  Substance Use Topics   Alcohol use: Yes    Alcohol/week: 4.0 standard drinks of alcohol    Types: 4 Glasses of wine per week   Drug use: Never   Allergies Augmentin [amoxicillin-pot clavulanate] and Sulfamethoxazole  Review of Systems Review of Systems  Constitutional:  Negative for activity change and fever.  HENT:  Negative for facial swelling and trouble swallowing.   Eyes:  Negative for discharge and redness.  Respiratory:  Negative for cough and shortness of breath.   Cardiovascular:  Positive for chest pain. Negative for palpitations.  Gastrointestinal:  Negative for abdominal pain and nausea.  Genitourinary:  Negative for dysuria and flank pain.  Musculoskeletal:  Negative for back pain and gait problem.  Skin:  Negative for pallor and rash.  Neurological:  Negative for syncope and headaches.    Physical Exam Vital Signs  I have reviewed the triage vital signs BP (!) 121/54 (BP Location: Right Arm)   Pulse 68   Temp 98.1 F (36.7 C) (Oral)   Resp 18   SpO2 97%  Physical Exam Vitals and nursing note reviewed.  Constitutional:      General: She is not in acute distress.    Appearance: Normal appearance.  HENT:     Head: Normocephalic and atraumatic.     Right Ear: External ear normal.     Left Ear: External ear normal.     Nose: Nose normal.     Mouth/Throat:     Mouth: Mucous membranes are moist.  Eyes:     General: No scleral icterus.       Right eye: No discharge.        Left eye: No discharge.  Cardiovascular:     Rate and Rhythm: Normal rate and regular rhythm.     Pulses: Normal pulses.     Heart sounds: Normal heart sounds.  Pulmonary:     Effort: Pulmonary effort is normal. No respiratory distress.     Breath sounds:  Normal breath sounds.  Chest:    Abdominal:     General: Abdomen is flat.     Palpations: Abdomen is soft.     Tenderness: There is no abdominal tenderness.  Musculoskeletal:        General: Normal range of motion.       Arms:     Cervical back: No rigidity.     Right lower leg: No edema.     Left lower leg: No edema.     Comments: No midline spinous process tenderness to palpation or percussion, no crepitus or step-off.  Skin:    General: Skin is warm and dry.     Capillary Refill: Capillary refill takes less than 2 seconds.  Neurological:     Mental Status: She is alert.  Psychiatric:        Mood and Affect: Mood normal.        Behavior: Behavior normal.     ED Results and Treatments Labs (all labs ordered are listed, but only abnormal results are displayed) Labs Reviewed - No data to display                                                                                                                        Radiology DG Ribs Unilateral W/Chest Right  Result Date: 12/05/2022 CLINICAL DATA:  fall/axilla pain. EXAM: RIGHT RIBS AND CHEST - 3+ VIEW COMPARISON:  Chest radiograph 06/19/2021. FINDINGS: Three views of the chest and right ribs. Possible nondisplaced fracture of the right lateral ninth rib. No pneumothorax or pleural effusion. Both lungs are clear. Heart size and mediastinal contours are within normal limits. IMPRESSION: Possible nondisplaced fracture of the right lateral ninth rib. No pneumothorax or pleural effusion. Electronically Signed   By: Orvan Falconer M.D.   On: 12/05/2022 09:37    Pertinent labs & imaging results that were available during my care of the patient were reviewed by me and considered in my medical decision making (see MDM for details).  Medications Ordered in ED Medications  lidocaine (LIDODERM) 5 % 1 patch (1 patch Transdermal Patch Applied 12/05/22 0922)  oxyCODONE-acetaminophen (PERCOCET/ROXICET) 5-325 MG per tablet 1 tablet (1 tablet  Oral Given 12/05/22 1610)                                                                                                                                     Procedures Procedures  (including critical care time)  Medical Decision Making / ED Course    Medical Decision Making:    PIA NANEZ is a 77 y.o. female  with past medical history as below, significant for HLD, MAC, COPD who presents to the ED with complaint of fall, right-sided rib pain.. The complaint involves an extensive differential diagnosis and also carries with it a high risk of complications and morbidity.  Serious etiology was considered. Ddx includes but is not limited to: rib fx, contusion, soft tissue, ptx, etcd  Complete initial physical exam performed, notably the patient  was NAD, HDS, no resp distress.    Reviewed and confirmed nursing documentation for past medical history, family history, social history.  Vital signs reviewed.    Clinical Course as of 12/05/22 1012  Thu Dec 05, 2022  1011 Patient with single nondisplaced rib fracture noted on x-ray.  Possible rib contusion as well on exam.  She is feeling much better after intervention in the ER.  She is breathing comfortably at ambient air.  No hypoxia.  No pneumothorax.  Discussed supportive care at home, close outpatient follow-up PCP.  Stable for discharge at this time [SG]    Clinical Course User Index [SG] Sloan Leiter, DO   Give incentive spirometer  The patient improved significantly and was discharged in stable condition. Detailed discussions were had with the patient regarding current findings, and need for close f/u with PCP or on call doctor. The patient has been instructed to return immediately if the symptoms worsen in any way for re-evaluation. Patient verbalized understanding and is in agreement with current care plan. All questions answered prior to discharge.    Additional history obtained: -Additional history obtained from  spouse -External records from outside source obtained and reviewed including: Chart review including previous notes, labs, imaging, consultation notes including primary care documentation, home meds   Lab Tests: na  EKG   EKG Interpretation  Date/Time:    Ventricular Rate:    PR Interval:    QRS Duration:   QT Interval:    QTC Calculation:   R Axis:     Text Interpretation:           Imaging Studies ordered: I ordered imaging studies including rib series right I independently visualized the following imaging with scope of interpretation limited to determining acute life threatening conditions related to emergency care; findings noted above, significant for rib fx, no ptx I independently visualized and interpreted imaging. I agree with the radiologist interpretation   Medicines ordered and prescription drug management: Meds ordered this encounter  Medications   oxyCODONE-acetaminophen (PERCOCET/ROXICET) 5-325 MG per tablet 1 tablet   lidocaine (LIDODERM) 5 % 1 patch   oxyCODONE (ROXICODONE) 5 MG immediate release tablet    Sig: Take 1 tablet (5 mg total) by mouth every 4 (four) hours as needed for severe pain.    Dispense:  10 tablet    Refill:  0   lidocaine (LIDODERM) 5 %    Sig: Place 1 patch onto the skin daily as needed. Remove & Discard patch within 12 hours or as directed by MD    Dispense:  15 patch    Refill:  0   tiZANidine (ZANAFLEX) 2 MG tablet    Sig: Take 1 tablet (2 mg total) by mouth every 8 (eight) hours as needed for up to 4 days for muscle spasms.    Dispense:  12 tablet    Refill:  0   acetaminophen (TYLENOL) 325 MG tablet    Sig: Take 2 tablets (650 mg total) by mouth every 6 (six) hours as needed.    Dispense:  36 tablet    Refill:  0    -I have reviewed the patients home medicines and have made adjustments as needed   Consultations Obtained: na   Cardiac Monitoring: Continuous pulse oximetry 97 to 99% on room air  Social  Determinants of Health:  Diagnosis or treatment significantly limited by social determinants of health: non smoker   Reevaluation: After the interventions noted above, I reevaluated the patient and  found that they have improved  Co morbidities that complicate the patient evaluation  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(HCC)       Dispostion: Disposition decision including need for hospitalization was considered, and patient discharged from emergency department.    Final Clinical Impression(s) / ED Diagnoses Final diagnoses:  Closed fracture of one rib of right side, initial encounter     This chart was dictated using voice recognition software.  Despite best efforts to proofread,  errors can occur which can change the documentation meaning.    Tanda Rockers A, DO 12/05/22 1012

## 2022-12-05 NOTE — Discharge Instructions (Addendum)
It was a pleasure caring for you today in the emergency department.  Please return to the emergency department for any worsening or worrisome symptoms.  You have a nondisplaced rib fracture on the right side of your chest likely from the fall he suffered a few days ago.  Please follow-up with your primary care doctor in the next 5 to 7 days for recheck.  Please return for any worsening or worrisome symptoms.

## 2022-12-05 NOTE — ED Triage Notes (Signed)
Pt arrives pov, steady gait, endorses mechanical fall x 3 days pta, c/o RT side flank/rib pain. Denies head injury or thinners. GCS 15

## 2022-12-06 ENCOUNTER — Telehealth: Payer: Self-pay

## 2022-12-06 NOTE — Transitions of Care (Post Inpatient/ED Visit) (Signed)
   12/06/2022  Name: Tracey Castro MRN: [id number] DOB: 03-Nov-1945  Today's TOC FU Call Status:    Transition Care Management Follow-up Telephone Call Date of Discharge: 12/05/22 Discharge Facility: [facility] ([facility]) Type of Discharge: Emergency Department Reason for ED Visit: Other: How have you been since you were released from the hospital?: Better Any questions or concerns?: No  Items Reviewed: Did you receive and understand the discharge instructions provided?: Yes Medications obtained,verified, and reconciled?: Yes (Medications Reviewed) Any new allergies since your discharge?: No Dietary orders reviewed?: NA Do you have support at home?: Yes People in Home: spouse Name of Support/Comfort Primary Source: paul robbins  Medications Reviewed Today: Medications Reviewed Today     Reviewed by Dorothyann Peng, MD (Physician) on 09/24/22 at 1541  Med List Status: <None>   Medication Order Taking? Sig Documenting Provider Last Dose Status Informant  Cholecalciferol (VITAMIN D) 2000 units tablet [id number] Yes Take 4,000-6,000 Units by mouth daily. [provider] Taking Active Self  diphenhydrAMINE HCl, Sleep, 25 MG CAPS [id number] Yes Take 25 mg by mouth daily as needed (sleep). [provider] Taking Active Self  Ferrous Gluconate-C-Folic Acid (IRON-C PO) [id number] Yes Take 65 mg by mouth. [provider] Taking Active   fluticasone furoate-vilanterol (BREO ELLIPTA) 100-25 MCG/INH AEPB [id number] Yes Inhale 1 puff into the lungs daily. USE 1 INHALATION DAILY Oretha Milch, MD Taking Active   ibuprofen (ADVIL,MOTRIN) 200 MG tablet [id number] Yes Take 600 mg by mouth 2 (two) times daily as needed for headache or moderate pain. [provider] Taking Active Self  Magnesium 500 MG CAPS [id number] Yes Take 500 mg by mouth daily. [provider] Taking Active Self  Multiple Vitamin (MULTIVITAMIN) capsule [id number] Yes Take 1 capsule  by mouth daily. [provider] Taking Active Self            Home Care and Equipment/Supplies: Were Home Health Services Ordered?: No Any new equipment or medical supplies ordered?: No  Functional Questionnaire: Do you need assistance with bathing/showering or dressing?: No Do you need assistance with meal preparation?: No Do you need assistance with eating?: No Do you need assistance with getting out of bed/getting out of a chair/moving?: No Do you have difficulty managing or taking your medications?: No  Follow up appointments reviewed: PCP Follow-up appointment confirmed?: Yes Date of PCP follow-up appointment?: 12/11/22 Follow-up Provider: Dorothyann Peng Specialist [facility] Follow-up appointment confirmed?: NA Do you need transportation to your follow-up appointment?: No Do you understand care options if your condition(s) worsen?: Yes-patient verbalized understanding    SIGNATURE Randa Lynn, CMA

## 2022-12-11 ENCOUNTER — Encounter: Payer: Self-pay | Admitting: Internal Medicine

## 2022-12-11 ENCOUNTER — Ambulatory Visit (INDEPENDENT_AMBULATORY_CARE_PROVIDER_SITE_OTHER): Payer: Medicare Other | Admitting: Internal Medicine

## 2022-12-11 VITALS — BP 110/78 | HR 71 | Temp 97.4°F | Ht 62.0 in | Wt 149.2 lb

## 2022-12-11 DIAGNOSIS — W010XXA Fall on same level from slipping, tripping and stumbling without subsequent striking against object, initial encounter: Secondary | ICD-10-CM

## 2022-12-11 DIAGNOSIS — E2839 Other primary ovarian failure: Secondary | ICD-10-CM | POA: Diagnosis not present

## 2022-12-11 DIAGNOSIS — W19XXXD Unspecified fall, subsequent encounter: Secondary | ICD-10-CM

## 2022-12-11 DIAGNOSIS — S2231XA Fracture of one rib, right side, initial encounter for closed fracture: Secondary | ICD-10-CM | POA: Diagnosis not present

## 2022-12-11 DIAGNOSIS — S2231XD Fracture of one rib, right side, subsequent encounter for fracture with routine healing: Secondary | ICD-10-CM

## 2022-12-11 NOTE — Patient Instructions (Signed)
Fall Prevention in the Home, Adult Falls can cause injuries and can happen to people of all ages. There are many things you can do to make your home safer and to help prevent falls. What actions can I take to prevent falls? General information Use good lighting in all rooms. Make sure to: Replace any light bulbs that burn out. Turn on the lights in dark areas and use night-lights. Keep items that you use often in easy-to-reach places. Lower the shelves around your home if needed. Move furniture so that there are clear paths around it. Do not use throw rugs or other things on the floor that can make you trip. If any of your floors are uneven, fix them. Add color or contrast paint or tape to clearly mark and help you see: Grab bars or handrails. First and last steps of staircases. Where the edge of each step is. If you use a ladder or stepladder: Make sure that it is fully opened. Do not climb a closed ladder. Make sure the sides of the ladder are locked in place. Have someone hold the ladder while you use it. Know where your pets are as you move through your home. What can I do in the bathroom?     Keep the floor dry. Clean up any water on the floor right away. Remove soap buildup in the bathtub or shower. Buildup makes bathtubs and showers slippery. Use non-skid mats or decals on the floor of the bathtub or shower. Attach bath mats securely with double-sided, non-slip rug tape. If you need to sit down in the shower, use a non-slip stool. Install grab bars by the toilet and in the bathtub and shower. Do not use towel bars as grab bars. What can I do in the bedroom? Make sure that you have a light by your bed that is easy to reach. Do not use any sheets or blankets on your bed that hang to the floor. Have a firm chair or bench with side arms that you can use for support when you get dressed. What can I do in the kitchen? Clean up any spills right away. If you need to reach something  above you, use a step stool with a grab bar. Keep electrical cords out of the way. Do not use floor polish or wax that makes floors slippery. What can I do with my stairs? Do not leave anything on the stairs. Make sure that you have a light switch at the top and the bottom of the stairs. Make sure that there are handrails on both sides of the stairs. Fix handrails that are broken or loose. Install non-slip stair treads on all your stairs if they do not have carpet. Avoid having throw rugs at the top or bottom of the stairs. Choose a carpet that does not hide the edge of the steps on the stairs. Make sure that the carpet is firmly attached to the stairs. Fix carpet that is loose or worn. What can I do on the outside of my home? Use bright outdoor lighting. Fix the edges of walkways and driveways and fix any cracks. Clear paths of anything that can make you trip, such as tools or rocks. Add color or contrast paint or tape to clearly mark and help you see anything that might make you trip as you walk through a door, such as a raised step or threshold. Trim any bushes or trees on paths to your home. Check to see if handrails are loose   or broken and that both sides of all steps have handrails. Install guardrails along the edges of any raised decks and porches. Have leaves, snow, or ice cleared regularly. Use sand, salt, or ice melter on paths if you live where there is ice and snow during the winter. Clean up any spills in your garage right away. This includes grease or oil spills. What other actions can I take? Review your medicines with your doctor. Some medicines can cause dizziness or changes in blood pressure, which increase your risk of falling. Wear shoes that: Have a low heel. Do not wear high heels. Have rubber bottoms and are closed at the toe. Feel good on your feet and fit well. Use tools that help you move around if needed. These include: Canes. Walkers. Scooters. Crutches. Ask  your doctor what else you can do to help prevent falls. This may include seeing a physical therapist to learn to do exercises to move better and get stronger. Where to find more information Centers for Disease Control and Prevention, [employer]: cdc.gov National Institute on Aging: nia.nih.gov National Institute on Aging: nia.nih.gov Contact a doctor if: You are afraid of falling at home. You feel weak, drowsy, or dizzy at home. You fall at home. Get help right away if you: Lose consciousness or have trouble moving after a fall. Have a fall that causes a head injury. These symptoms may be an emergency. Get help right away. Call 911. Do not wait to see if the symptoms will go away. Do not drive yourself to the hospital. This information is not intended to replace advice given to you by your health care provider. Make sure you discuss any questions you have with your health care provider. Document Revised: 02/04/2022 Document Reviewed: 02/04/2022 Elsevier Patient Education  2024 [employer]  

## 2022-12-11 NOTE — Progress Notes (Signed)
I,Victoria T Deloria Lair, CMA,acting as a Neurosurgeon for Gwynneth Aliment, MD.,have documented all relevant documentation on the behalf of Gwynneth Aliment, MD,as directed by  Gwynneth Aliment, MD while in the presence of Gwynneth Aliment, MD.  Subjective:  Patient ID: Tracey Castro , female    DOB: 22-Feb-1946 , 77 y.o.   MRN: [id number]  Chief Complaint  Patient presents with   Follow-up    HPI  Patient presents today for ED follow up. She visited [facility] on 12/05/2022 with a complaint of fall and right-sided rib pain.  Patient accompanied by spouse today.  Patient reports that she slipped on patio on 6/18 when going to water her basil plant in the raised flowerbed.  She fell towards the ground and fell onto the back of a chair onto her right side.  No head injury, no LOC, she was ambulatory after the event.  No pain to extremities.  She was having progressively worsening pain to her right axilla since the fall.  Minimal improvement with NSAIDs.  No difficulty breathing, no chest pain otherwise.  No nausea or vomiting.  No fevers or chills.  No change in bowel or bladder function. ED workup revealed rib fracture. She has been taking Tylenol and using lidocaine patch which has gradually improved her pain.             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(HCC)      Family History  Problem Relation Age of Onset   Cancer - Ovarian Mother    Cancer - Colon Father    Parkinson's disease Father      Current Outpatient Medications:    acetaminophen (TYLENOL) 325 MG tablet, Take 2 tablets (650 mg total) by mouth every 6 (six) hours as needed., Disp: 36 tablet, Rfl: 0   Cholecalciferol (VITAMIN D) 2000 units tablet, Take 4,000-6,000 Units by mouth daily., Disp: , Rfl:    diphenhydrAMINE HCl, Sleep, 25 MG CAPS, Take 25 mg by mouth daily as needed (sleep)., Disp: , Rfl:    Ferrous Gluconate-C-Folic Acid (IRON-C PO), Take 65 mg by mouth., Disp:  , Rfl:    fluticasone furoate-vilanterol (BREO ELLIPTA) 100-25 MCG/INH AEPB, Inhale 1 puff into the lungs daily. USE 1 INHALATION DAILY, Disp: 180 each, Rfl: 1   ibuprofen (ADVIL,MOTRIN) 200 MG tablet, Take 600 mg by mouth 2 (two) times daily as needed for headache or moderate pain., Disp: , Rfl:    lidocaine (LIDODERM) 5 %, Place 1 patch onto the skin daily as needed. Remove & Discard patch within 12 hours or as directed by MD, Disp: 15 patch, Rfl: 0   Magnesium 500 MG CAPS, Take 500 mg by mouth daily., Disp: , Rfl:    Multiple Vitamin (MULTIVITAMIN) capsule, Take 1 capsule by mouth daily., Disp: , Rfl:    oxyCODONE (ROXICODONE) 5 MG immediate release tablet, Take 1 tablet (5 mg total) by mouth every 4 (four) hours as needed for severe pain., Disp: 10 tablet, Rfl: 0   Allergies  Allergen Reactions   Augmentin [Amoxicillin-Pot Clavulanate] Nausea And Vomiting   Sulfamethoxazole Rash     Review of Systems  Constitutional: Negative.   Respiratory: Negative.    Cardiovascular: Negative.   Gastrointestinal: Negative.   Neurological: Negative.   Psychiatric/Behavioral: Negative.       Today's Vitals   12/11/22 1107  BP: 110/78  Pulse: 71  Temp: (!) 97.4 F (36.3 C)  SpO2:  98%  Weight: 149 lb 3.2 oz (67.7 kg)  Height: 5\' 2"  (1.575 m)   Body mass index is 27.29 kg/m.  Wt Readings from Last 3 Encounters:  12/11/22 149 lb 3.2 oz (67.7 kg)  09/24/22 150 lb 12.8 oz (68.4 kg)  07/10/22 144 lb (65.3 kg)      Objective:  Physical Exam Vitals and nursing note reviewed.  Constitutional:      Appearance: Normal appearance.  HENT:     Head: Normocephalic and atraumatic.  Eyes:     Extraocular Movements: Extraocular movements intact.  Cardiovascular:     Rate and Rhythm: Normal rate and regular rhythm.     Heart sounds: Normal heart sounds.  Pulmonary:     Effort: Pulmonary effort is normal.     Breath sounds: Normal breath sounds.     Comments: Right lateral chest tender to  palpation Musculoskeletal:     Cervical back: Normal range of motion.  Skin:    General: Skin is warm.  Neurological:     General: No focal deficit present.     Mental Status: She is alert.  Psychiatric:        Mood and Affect: Mood normal.        Behavior: Behavior normal.         Assessment And Plan:  1. Fall, subsequent encounter Comments: Occurred on 6/18. Encouraged to wear sneakers when going out to water her plants.  2. Closed fracture of one rib of right side with routine healing, subsequent encounter Comments: ED records reviewed in detail. She will continue to use lidocaine patch prn.  3. Estrogen deficiency Comments: She agrees to bone density. - DG Bone Density; Future    Return if symptoms worsen or fail to improve.  Patient was given opportunity to ask questions. Patient verbalized understanding of the plan and was able to repeat key elements of the plan. All questions were answered to their satisfaction.    I, Gwynneth Aliment, MD, have reviewed all documentation for this visit. The documentation on 12/11/22 for the exam, diagnosis, procedures, and orders are all accurate and complete.   IF YOU HAVE BEEN REFERRED TO A SPECIALIST, IT MAY TAKE 1-2 WEEKS TO SCHEDULE/PROCESS THE REFERRAL. IF YOU HAVE NOT HEARD FROM US/SPECIALIST IN TWO WEEKS, PLEASE GIVE [place] A CALL AT [phone].

## 2022-12-12 ENCOUNTER — Telehealth: Payer: Self-pay

## 2022-12-12 NOTE — Telephone Encounter (Signed)
Transition Care Management Follow-up Telephone Call Date of discharge and from where: Tracey Castro 6/20 How have you been since you were released from the hospital? Doing fine  Any questions or concerns? No  Items Reviewed: Did the pt receive and understand the discharge instructions provided? Yes  Medications obtained and verified? Yes  Other? No  Any new allergies since your discharge? No  Dietary orders reviewed? No Do you have support at home? Yes     Follow up appointments reviewed:  PCP Hospital f/u appt confirmed? Yes  Scheduled to see PCP on 6/26 @ . Specialist Hospital f/u appt confirmed? No  Scheduled to see  on  @ . Are transportation arrangements needed? No  If their condition worsens, is the pt aware to call PCP or go to the Emergency Dept.? Yes Was the patient provided with contact information for the PCP's office or ED? Yes Was to pt encouraged to call back with questions or concerns? Yes

## 2022-12-23 ENCOUNTER — Other Ambulatory Visit: Payer: Self-pay | Admitting: Internal Medicine

## 2022-12-23 DIAGNOSIS — Z1231 Encounter for screening mammogram for malignant neoplasm of breast: Secondary | ICD-10-CM

## 2022-12-30 DIAGNOSIS — M8588 Other specified disorders of bone density and structure, other site: Secondary | ICD-10-CM | POA: Diagnosis not present

## 2022-12-30 DIAGNOSIS — E349 Endocrine disorder, unspecified: Secondary | ICD-10-CM | POA: Diagnosis not present

## 2022-12-30 DIAGNOSIS — N958 Other specified menopausal and perimenopausal disorders: Secondary | ICD-10-CM | POA: Diagnosis not present

## 2022-12-30 DIAGNOSIS — R29890 Loss of height: Secondary | ICD-10-CM | POA: Diagnosis not present

## 2023-01-08 ENCOUNTER — Encounter: Payer: Self-pay | Admitting: Internal Medicine

## 2023-01-24 ENCOUNTER — Ambulatory Visit
Admission: RE | Admit: 2023-01-24 | Discharge: 2023-01-24 | Disposition: A | Discharge status: Home / Self Care | Payer: Medicare Other | Source: Ambulatory Visit | Attending: Internal Medicine | Admitting: Internal Medicine

## 2023-01-24 DIAGNOSIS — Z1231 Encounter for screening mammogram for malignant neoplasm of breast: Secondary | ICD-10-CM

## 2023-03-04 DIAGNOSIS — Z23 Encounter for immunization: Secondary | ICD-10-CM | POA: Diagnosis not present

## 2023-03-27 ENCOUNTER — Other Ambulatory Visit: Payer: Medicare Other

## 2023-03-27 DIAGNOSIS — E7800 Pure hypercholesterolemia, unspecified: Secondary | ICD-10-CM | POA: Diagnosis not present

## 2023-03-28 LAB — LIPID PANEL
Chol/HDL Ratio: 4 {ratio} (ref 0.0–4.4)
Cholesterol, Total: 217 mg/dL — ABNORMAL HIGH (ref 100–199)
HDL: 54 mg/dL (ref >39)
LDL Chol Calc (NIH): 138 mg/dL — ABNORMAL HIGH (ref 0–99)
Triglycerides: 143 mg/dL (ref 0–149)
VLDL Cholesterol Cal: 25 mg/dL (ref 5–40)

## 2023-03-31 NOTE — Patient Instructions (Incomplete)
Magnesium glycinate, Pure Encapsulation Organic dandelion root tea or Liver Cleanse tea Trazodone  Cholesterol Content in Foods Cholesterol is a waxy, fat-like substance that helps to carry fat in the blood. The body needs cholesterol in small amounts, but too much cholesterol can cause damage to the arteries and heart. What foods have cholesterol?  Cholesterol is found in animal-based foods, such as meat, seafood, and dairy. Generally, low-fat dairy and lean meats have less cholesterol than full-fat dairy and fatty meats. The milligrams of cholesterol per serving (mg per serving) of common cholesterol-containing foods are listed below. Meats and other proteins Egg -- one large whole egg has 186 mg. Veal shank -- 4 oz (113 g) has 141 mg. Lean ground [place] (93% lean) -- 4 oz (113 g) has 118 mg. Fat-trimmed lamb loin -- 4 oz (113 g) has 106 mg. Lean ground beef (90% lean) -- 4 oz (113 g) has 100 mg. Lobster -- 3.5 oz (99 g) has 90 mg. Pork loin chops -- 4 oz (113 g) has 86 mg. Canned salmon -- 3.5 oz (99 g) has 83 mg. Fat-trimmed beef top loin -- 4 oz (113 g) has 78 mg. Frankfurter -- 1 frank (3.5 oz or 99 g) has 77 mg. Crab -- 3.5 oz (99 g) has 71 mg. Roasted chicken without skin, white meat -- 4 oz (113 g) has 66 mg. Light bologna -- 2 oz (57 g) has 45 mg. Deli-cut [place] -- 2 oz (57 g) has 31 mg. Canned tuna -- 3.5 oz (99 g) has 31 mg. Tomasa Blase -- 1 oz (28 g) has 29 mg. Oysters and mussels (raw) -- 3.5 oz (99 g) has 25 mg. Mackerel -- 1 oz (28 g) has 22 mg. Trout -- 1 oz (28 g) has 20 mg. Pork sausage -- 1 link (1 oz or 28 g) has 17 mg. Salmon -- 1 oz (28 g) has 16 mg. Tilapia -- 1 oz (28 g) has 14 mg. Dairy Soft-serve ice cream --  cup (4 oz or 86 g) has 103 mg. Whole-milk yogurt -- 1 cup (8 oz or 245 g) has 29 mg. Cheddar cheese -- 1 oz (28 g) has 28 mg. American cheese -- 1 oz (28 g) has 28 mg. Whole milk -- 1 cup (8 oz or 250 mL) has 23 mg. 2% milk -- 1 cup (8 oz or 250 mL)  has 18 mg. Cream cheese -- 1 tablespoon (Tbsp) (14.5 g) has 15 mg. Cottage cheese --  cup (4 oz or 113 g) has 14 mg. Low-fat (1%) milk -- 1 cup (8 oz or 250 mL) has 10 mg. Sour cream -- 1 Tbsp (12 g) has 8.5 mg. Low-fat yogurt -- 1 cup (8 oz or 245 g) has 8 mg. Nonfat Greek yogurt -- 1 cup (8 oz or 228 g) has 7 mg. Half-and-half cream -- 1 Tbsp (15 mL) has 5 mg. Fats and oils Cod liver oil -- 1 tablespoon (Tbsp) (13.6 g) has 82 mg. Butter -- 1 Tbsp (14 g) has 15 mg. Lard -- 1 Tbsp (12.8 g) has 14 mg. Bacon grease -- 1 Tbsp (12.9 g) has 14 mg. Mayonnaise -- 1 Tbsp (13.8 g) has 5-10 mg. Margarine -- 1 Tbsp (14 g) has 3-10 mg. The items listed above may not be a complete list of foods with cholesterol. Exact amounts of cholesterol in these foods may vary depending on specific ingredients and brands. Contact a dietitian for more information. What foods do not have cholesterol? Most plant-based  foods do not have cholesterol unless you combine them with a food that has cholesterol. Foods without cholesterol include: Grains and cereals. Vegetables. Fruits. Vegetable oils, such as olive, canola, and sunflower oil. Legumes, such as peas, beans, and lentils. Nuts and seeds. Egg whites. The items listed above may not be a complete list of foods that do not have cholesterol. Contact a dietitian for more information. Summary The body needs cholesterol in small amounts, but too much cholesterol can cause damage to the arteries and heart. Cholesterol is found in animal-based foods, such as meat, seafood, and dairy. Generally, low-fat dairy and lean meats have less cholesterol than full-fat dairy and fatty meats. This information is not intended to replace advice given to you by your health care provider. Make sure you discuss any questions you have with your health care provider. Document Revised: 10/13/2020 Document Reviewed: 10/13/2020 Elsevier Patient Education  2024 [employer].

## 2023-03-31 NOTE — Progress Notes (Signed)
I,Victoria T Deloria Lair, CMA,acting as a Neurosurgeon for Gwynneth Aliment, MD.,have documented all relevant documentation on the behalf of Gwynneth Aliment, MD,as directed by  Gwynneth Aliment, MD while in the presence of Gwynneth Aliment, MD.  Subjective:  Patient ID: Tracey Castro , female    DOB: 02/19/1946 , 77 y.o.   MRN: [id number]  No chief complaint on file.   HPI  Patient presents today for a chol check. She currently does not take any prescribed medications for cholesterol. She completed lipid panel lab on 03/27/23. She denies headache, chest pain & sob.   Hyperlipidemia This is a chronic problem. The current episode started more than 1 year ago. The problem is uncontrolled. Pertinent negatives include no chest pain, leg pain or shortness of breath. Current antihyperlipidemic treatment includes diet change. Risk factors for coronary artery disease include dyslipidemia and post-menopausal.     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(HCC)      Family History  Problem Relation Age of Onset   Cancer - Ovarian Mother    Cancer - Colon Father    Parkinson's disease Father      Current Outpatient Medications:    acetaminophen (TYLENOL) 325 MG tablet, Take 2 tablets (650 mg total) by mouth every 6 (six) hours as needed., Disp: 36 tablet, Rfl: 0   Cholecalciferol (VITAMIN D) 2000 units tablet, Take 4,000-6,000 Units by mouth daily., Disp: , Rfl:    diphenhydrAMINE HCl, Sleep, 25 MG CAPS, Take 25 mg by mouth daily as needed (sleep)., Disp: , Rfl:    Ferrous Gluconate-C-Folic Acid (IRON-C PO), Take 65 mg by mouth., Disp: , Rfl:    ibuprofen (ADVIL,MOTRIN) 200 MG tablet, Take 600 mg by mouth 2 (two) times daily as needed for headache or moderate pain., Disp: , Rfl:    lidocaine (LIDODERM) 5 %, Place 1 patch onto the skin daily as needed. Remove & Discard patch within 12 hours or as directed by MD, Disp: 15 patch, Rfl: 0   Magnesium 500 MG  CAPS, Take 500 mg by mouth daily., Disp: , Rfl:    Multiple Vitamin (MULTIVITAMIN) capsule, Take 1 capsule by mouth daily., Disp: , Rfl:    Allergies  Allergen Reactions   Augmentin [Amoxicillin-Pot Clavulanate] Nausea And Vomiting   Sulfamethoxazole Rash     Review of Systems  Constitutional: Negative.   Respiratory: Negative.  Negative for shortness of breath.   Cardiovascular: Negative.  Negative for chest pain.  Gastrointestinal: Negative.   Neurological: Negative.   Psychiatric/Behavioral: Negative.       Today's Vitals   04/01/23 1150  BP: 118/74  Pulse: 70  Temp: 97.9 F (36.6 C)  SpO2: 98%  Weight: 149 lb 12.8 oz (67.9 kg)  Height: 5\' 2"  (1.575 m)   Body mass index is 27.4 kg/m.  Wt Readings from Last 3 Encounters:  04/01/23 149 lb 12.8 oz (67.9 kg)  12/11/22 149 lb 3.2 oz (67.7 kg)  09/24/22 150 lb 12.8 oz (68.4 kg)     Objective:  Physical Exam Vitals and nursing note reviewed.  Constitutional:      Appearance: Normal appearance.  HENT:     Head: Normocephalic and atraumatic.  Eyes:     Extraocular Movements: Extraocular movements intact.  Cardiovascular:     Rate and Rhythm: Normal rate and regular rhythm.     Heart sounds: Normal heart sounds.  Pulmonary:     Effort: Pulmonary effort  is normal.     Breath sounds: Normal breath sounds.  Musculoskeletal:     Cervical back: Normal range of motion.  Skin:    General: Skin is warm.  Neurological:     General: No focal deficit present.     Mental Status: She is alert.  Psychiatric:        Mood and Affect: Mood normal.        Behavior: Behavior normal.         Assessment And Plan:  Dyslipidemia due to type 2 diabetes mellitus [facility]) Assessment & Plan: Feb 2024 A1c 6.5.  She does not wish to take rx meds. She agrees to Nutrition referral for diabetic teaching. Risks associated with untreated DM was discussed with the patient in detail.   Orders: -     Hemoglobin A1c -     Lipoprotein A  (LPA) -     Referral to Nutrition and Diabetes Services  Primary insomnia Assessment & Plan: Encouraged to consider magnesium nightly, glycinate is the best version. If using melatonin, no more than 3 mg nightly. If ineffective, can consider trazodone nightly.       Return in 4 months (on 08/02/2023), or if symptoms worsen or fail to improve.  Patient was given opportunity to ask questions. Patient verbalized understanding of the plan and was able to repeat key elements of the plan. All questions were answered to their satisfaction.    I, Gwynneth Aliment, MD, have reviewed all documentation for this visit. The documentation on 04/01/23 for the exam, diagnosis, procedures, and orders are all accurate and complete.   IF YOU HAVE BEEN REFERRED TO A SPECIALIST, IT MAY TAKE 1-2 WEEKS TO SCHEDULE/PROCESS THE REFERRAL. IF YOU HAVE NOT HEARD FROM US/SPECIALIST IN TWO WEEKS, PLEASE GIVE [place] A CALL AT [phone].   THE PATIENT IS ENCOURAGED TO PRACTICE SOCIAL DISTANCING DUE TO THE COVID-19 PANDEMIC.

## 2023-04-01 ENCOUNTER — Ambulatory Visit (INDEPENDENT_AMBULATORY_CARE_PROVIDER_SITE_OTHER): Payer: Medicare Other | Admitting: Internal Medicine

## 2023-04-01 VITALS — BP 118/74 | HR 70 | Temp 97.9°F | Ht 62.0 in | Wt 149.8 lb

## 2023-04-01 DIAGNOSIS — E785 Hyperlipidemia, unspecified: Secondary | ICD-10-CM

## 2023-04-01 DIAGNOSIS — F5101 Primary insomnia: Secondary | ICD-10-CM | POA: Diagnosis not present

## 2023-04-01 DIAGNOSIS — E1169 Type 2 diabetes mellitus with other specified complication: Secondary | ICD-10-CM

## 2023-04-01 DIAGNOSIS — D692 Other nonthrombocytopenic purpura: Secondary | ICD-10-CM

## 2023-04-01 DIAGNOSIS — R7309 Other abnormal glucose: Secondary | ICD-10-CM

## 2023-04-01 DIAGNOSIS — E7800 Pure hypercholesterolemia, unspecified: Secondary | ICD-10-CM

## 2023-04-01 DIAGNOSIS — E2839 Other primary ovarian failure: Secondary | ICD-10-CM

## 2023-04-01 DIAGNOSIS — J449 Chronic obstructive pulmonary disease, unspecified: Secondary | ICD-10-CM

## 2023-04-02 LAB — HEMOGLOBIN A1C
Est. average glucose Bld gHb Est-mCnc: 131 mg/dL
Hgb A1c MFr Bld: 6.2 % — ABNORMAL HIGH (ref 4.8–5.6)

## 2023-04-02 LAB — LIPOPROTEIN A (LPA): Lipoprotein (a): 46.2 nmol/L (ref <75.0)

## 2023-04-13 NOTE — Assessment & Plan Note (Signed)
Encouraged to consider magnesium nightly, glycinate is the best version. If using melatonin, no more than 3 mg nightly. If ineffective, can consider trazodone nightly.

## 2023-04-13 NOTE — Assessment & Plan Note (Addendum)
Chronic, she does not wish to take rx meds. She agrees to Nutrition referral for diabetic teaching. Risks associated with untreated DM was discussed with the patient in detail.

## 2023-04-13 NOTE — Assessment & Plan Note (Signed)
Feb 2024 A1c 6.5.  She does not wish to take rx meds. She agrees to Nutrition referral for diabetic teaching. Risks associated with untreated DM was discussed with the patient in detail.

## 2023-04-28 ENCOUNTER — Ambulatory Visit: Payer: Medicare Other | Admitting: Nurse Practitioner

## 2023-04-28 ENCOUNTER — Encounter: Payer: Self-pay | Admitting: Nurse Practitioner

## 2023-04-28 VITALS — BP 100/68 | HR 85 | Temp 98.0°F | Ht 62.0 in | Wt 150.0 lb

## 2023-04-28 DIAGNOSIS — R0982 Postnasal drip: Secondary | ICD-10-CM

## 2023-04-28 DIAGNOSIS — R0981 Nasal congestion: Secondary | ICD-10-CM

## 2023-04-28 DIAGNOSIS — R051 Acute cough: Secondary | ICD-10-CM

## 2023-04-28 NOTE — Progress Notes (Signed)
Madelaine Bhat, CMA,acting as a Neurosurgeon for Arnette Felts, FNP.,have documented all relevant documentation on the behalf of Arnette Felts, FNP,as directed by  Arnette Felts, FNP while in the presence of Arnette Felts, FNP.  Subjective:  Patient ID: Tracey Castro , female    DOB: [birth year] , 77 y.o.   MRN: [id number]  No chief complaint on file.   HPI  Patient presents today for sinus issues. Patient reports she has a lot of post nasal drip, nasal congestion. Patient reports it first started last Monday, she reports she is unable to sleep. She reports she has a cough from her post nasal drip. She has a dry cough. She was taking nyquil at night and advil sinus. She has a little bit of phlegm without any significant color. She does not take any zyrtec.       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(HCC)      Family History  Problem Relation Age of Onset   Cancer - Ovarian Mother    Cancer - Colon Father    Parkinson's disease Father      Current Outpatient Medications:    acetaminophen (TYLENOL) 325 MG tablet, Take 2 tablets (650 mg total) by mouth every 6 (six) hours as needed., Disp: 36 tablet, Rfl: 0   Cholecalciferol (VITAMIN D) 2000 units tablet, Take 4,000-6,000 Units by mouth daily., Disp: , Rfl:    diphenhydrAMINE HCl, Sleep, 25 MG CAPS, Take 25 mg by mouth daily as needed (sleep)., Disp: , Rfl:    Ferrous Gluconate-C-Folic Acid (IRON-C PO), Take 65 mg by mouth., Disp: , Rfl:    ibuprofen (ADVIL,MOTRIN) 200 MG tablet, Take 600 mg by mouth 2 (two) times daily as needed for headache or moderate pain., Disp: , Rfl:    lidocaine (LIDODERM) 5 %, Place 1 patch onto the skin daily as needed. Remove & Discard patch within 12 hours or as directed by MD, Disp: 15 patch, Rfl: 0   Magnesium 500 MG CAPS, Take 500 mg by mouth daily., Disp: , Rfl:    Multiple Vitamin (MULTIVITAMIN) capsule, Take 1 capsule by mouth daily., Disp: , Rfl:     Allergies  Allergen Reactions   Augmentin [Amoxicillin-Pot Clavulanate] Nausea And Vomiting   Sulfamethoxazole Rash     Review of Systems  Constitutional: Negative.   HENT:  Positive for postnasal drip. Negative for congestion and sinus pressure.   Respiratory: Negative.    Cardiovascular: Negative.   Neurological: Negative.   Psychiatric/Behavioral: Negative.       Today's Vitals   04/28/23 1240  BP: 100/68  Pulse: 85  Temp: 98 F (36.7 C)  TempSrc: Oral  SpO2: 99%  Weight: 150 lb (68 kg)  Height: 5\' 2"  (1.575 m)  PainSc: 0-No pain   Body mass index is 27.44 kg/m.  Wt Readings from Last 3 Encounters:  04/28/23 150 lb (68 kg)  04/01/23 149 lb 12.8 oz (67.9 kg)  12/11/22 149 lb 3.2 oz (67.7 kg)     Objective:  Physical Exam Vitals reviewed.  Constitutional:      General: She is not in acute distress.    Appearance: Normal appearance.  Cardiovascular:     Rate and Rhythm: Normal rate and regular rhythm.     Pulses: Normal pulses.     Heart sounds: Normal heart sounds. No murmur heard. Pulmonary:     Effort: Pulmonary effort is normal. No respiratory distress.  Breath sounds: Normal breath sounds. No wheezing.  Abdominal:     Tenderness: There is no right CVA tenderness or left CVA tenderness.  Neurological:     General: No focal deficit present.     Mental Status: She is alert and oriented to person, place, and time.     Cranial Nerves: No cranial nerve deficit.     Motor: No weakness.  Psychiatric:        Mood and Affect: Mood normal.        Behavior: Behavior normal.        Thought Content: Thought content normal.        Judgment: Judgment normal.         Assessment And Plan:  Post-nasal drip Assessment & Plan: She is encouraged to take over the counter zyrtec at night, if not better return call to office   Acute cough Assessment & Plan: This is improving, she will continue taking over the counter cough syrup. Wanted to make sure lungs  were good   Nasal congestion Assessment & Plan: Zyrtec should be effective. This is likely viral vs seasonal allergies (may be new onset)     No follow-ups on file.   Patient was given opportunity to ask questions. Patient verbalized understanding of the plan and was able to repeat key elements of the plan. All questions were answered to their satisfaction.    Jeanell Sparrow, FNP, have reviewed all documentation for this visit. The documentation on 04/28/23 for the exam, diagnosis, procedures, and orders are all accurate and complete.   IF YOU HAVE BEEN REFERRED TO A SPECIALIST, IT MAY TAKE 1-2 WEEKS TO SCHEDULE/PROCESS THE REFERRAL. IF YOU HAVE NOT HEARD FROM US/SPECIALIST IN TWO WEEKS, PLEASE GIVE [place] A CALL AT [phone].

## 2023-05-06 DIAGNOSIS — R0982 Postnasal drip: Secondary | ICD-10-CM | POA: Insufficient documentation

## 2023-05-06 DIAGNOSIS — R0981 Nasal congestion: Secondary | ICD-10-CM | POA: Insufficient documentation

## 2023-05-06 NOTE — Assessment & Plan Note (Signed)
She is encouraged to take over the counter zyrtec at night, if not better return call to office

## 2023-05-06 NOTE — Assessment & Plan Note (Addendum)
Zyrtec should be effective. This is likely viral vs seasonal allergies (may be new onset)

## 2023-05-06 NOTE — Assessment & Plan Note (Signed)
This is improving, she will continue taking over the counter cough syrup. Wanted to make sure lungs were good

## 2023-05-13 ENCOUNTER — Ambulatory Visit (INDEPENDENT_AMBULATORY_CARE_PROVIDER_SITE_OTHER): Payer: Self-pay | Admitting: Audiology

## 2023-05-13 NOTE — Progress Notes (Signed)
Patient was seen today for a hearing aid check. She reported decreased understanding. Cleaned both [employer] R[employer] hearing aids; replaced the wax filters and domes. Connected her to the software and increased the overall gain closer to 100% of the target. Decreased the high frequencies one step to help with tinny sound quality. Patient reported good sound volume, clarity, and balance between her ears. Connected her hearing aids to her iPhone and iPad. Confirmed both the bluetooth and apps were functioning properly. Instructed patient to call if any concerns arise.   Conley Rolls Torence Palmeri, AUD, CCC-A 05/13/23

## 2023-05-21 DIAGNOSIS — Z23 Encounter for immunization: Secondary | ICD-10-CM | POA: Diagnosis not present

## 2023-05-26 ENCOUNTER — Encounter: Payer: Medicare Other | Attending: Internal Medicine | Admitting: Dietician

## 2023-05-26 DIAGNOSIS — E1169 Type 2 diabetes mellitus with other specified complication: Secondary | ICD-10-CM | POA: Diagnosis not present

## 2023-05-26 DIAGNOSIS — E785 Hyperlipidemia, unspecified: Secondary | ICD-10-CM | POA: Insufficient documentation

## 2023-05-26 NOTE — Patient Instructions (Signed)
1- Use Nutrition Labels to decrease intake of saturated fats  Aim for 2 grams or less of saturated fat per serving  Also consider 2 grams daily of plant stanols/sterols

## 2023-05-26 NOTE — Progress Notes (Signed)
Diabetes Self-Management Education  Visit Type: First/Initial  Appt. Start Time: 1525 Appt. End Time: 1625  05/26/2023  Tracey Castro, identified by name and date of birth, is a 77 y.o. female with a diagnosis of Diabetes: Pre-Diabetes.   ASSESSMENT  Patient is here today alone Patient would like to learn about A1C% and heart health. Pt feels her diagnosis is prediabetes and states this lab was obtained with she was COVID+. Patient lives with her husband.  Pt does shopping and cooking. Pt reports her Sleep is interrupted ~ 7 hours nightly and desires improvements. Pt states a plan to discuss further with PCP. Pt reports she participates in classes at her gym for balance and fitness and does silver fit with weights. Pt reports eating out once weekly and planning plant based meals once weekly.   History includes:   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(HCC)     Labs noted:   Lab Results  Component Value Date   CHOL 217 (H) 03/27/2023   HDL 54 03/27/2023   LDLCALC 138 (H) 03/27/2023   TRIG 143 03/27/2023   CHOLHDL 4.0 03/27/2023   Lab Results  Component Value Date   HGBA1C 6.2 (H) 04/01/2023    Medications include:   Current Outpatient Medications:    ascorbic acid (VITAMIN C) 500 MG tablet, Take 500 mg by mouth daily., Disp: , Rfl:    cyanocobalamin (VITAMIN B12) 1000 MCG tablet, Take 1,000 mcg by mouth daily., Disp: , Rfl:    diphenhydrAMINE HCl, Sleep, 25 MG CAPS, Take 25 mg by mouth daily as needed (sleep)., Disp: , Rfl:    Ferrous Gluconate-C-Folic Acid (IRON-C PO), Take 65 mg by mouth., Disp: , Rfl:    ibuprofen (ADVIL,MOTRIN) 200 MG tablet, Take 600 mg by mouth 2 (two) times daily as needed for headache or moderate pain., Disp: , Rfl:    Magnesium 500 MG CAPS, Take 120 mg by mouth daily., Disp: , Rfl:    Multiple Vitamin (MULTIVITAMIN) capsule, Take 1 capsule by mouth daily., Disp: , Rfl:    acetaminophen  (TYLENOL) 325 MG tablet, Take 2 tablets (650 mg total) by mouth every 6 (six) hours as needed. (Patient not taking: Reported on 05/26/2023), Disp: 36 tablet, Rfl: 0   Cholecalciferol (VITAMIN D) 2000 units tablet, Take 4,000-6,000 Units by mouth daily., Disp: , Rfl:    lidocaine (LIDODERM) 5 %, Place 1 patch onto the skin daily as needed. Remove & Discard patch within 12 hours or as directed by MD (Patient not taking: Reported on 05/26/2023), Disp: 15 patch, Rfl: 0 '  There were no vitals taken for this visit. There is no height or weight on file to calculate BMI.   Diabetes Self-Management Education - 05/26/23 1535       Visit Information   Visit Type First/Initial      Initial Visit   Diabetes Type Pre-Diabetes    Date Diagnosed Pt states prediabetes dx    Are you currently following a meal plan? No    Are you taking your medications as prescribed? Not on Medications      Health Coping   How would you rate your overall health? Good      Psychosocial Assessment   Patient Belief/Attitude about Diabetes Motivated to manage diabetes    What is the hardest part about your diabetes right now, causing you the most concern, or is the most worrisome to you about your diabetes?  Making healty food and beverage choices    Self-care barriers None    Self-management support Doctor's office    Other persons present Patient    Patient Concerns Nutrition/Meal planning    Special Needs None    Preferred Learning Style No preference indicated    Learning Readiness Change in progress    How often do you need to have someone help you when you read instructions, pamphlets, or other written materials from your doctor or pharmacy? 1 - Never    What is the last grade level you completed in school? college      Pre-Education Assessment   Patient understands the diabetes disease and treatment process. Needs Instruction    Patient understands incorporating nutritional management into lifestyle. Needs  Instruction    Patient undertands incorporating physical activity into lifestyle. Demonstrates understanding / competency    Patient understands using medications safely. Needs Instruction    Patient understands monitoring blood glucose, interpreting and using results N/A (comment)    Patient understands prevention, detection, and treatment of acute complications. Needs Instruction    Patient understands prevention, detection, and treatment of chronic complications. Needs Instruction    Patient understands how to develop strategies to address psychosocial issues. Needs Instruction    Patient understands how to develop strategies to promote health/change behavior. Needs Instruction      Complications   Last HgB A1C per patient/outside source 6.2 %    How often do you check your blood sugar? 0 times/day (not testing)    Have you had a dilated eye exam in the past 12 months? Yes    Have you had a dental exam in the past 12 months? Yes    Are you checking your feet? Yes    How many days per week are you checking your feet? 7      Dietary Intake   Breakfast 1/2 english muffin with peanut butter or 1 whole wheat toast, 1 protein scoop, 4oz 2%, banana or berries, no greek fat yogurt or bagel with butter or cream cheese, tea with 2%, 1/2 tesapoon of sugar,    Lunch home made soup, 4-5 crackers with cheese or hummus with veggies or cold cuts with 1 slices of bread, cole slaw    Snack (afternoon) ~ 3 pm: chocolate or popcorn or cookie    Dinner ~6: 30pm chinese food or chicken, broccoli or asapargus, brown rice or potaoe or lentin, pasta, salad, bread    Beverage(s) tea with 2%+ 1/2 teaspoon of sugar, water, diet soda, unsweet tea      Activity / Exercise   Activity / Exercise Type Light (walking / raking leaves)    How many days per week do you exercise? 3    How many minutes per day do you exercise? 50    Total minutes per week of exercise 150      Patient Education   Previous Diabetes  Education No    Disease Pathophysiology Factors that contribute to the development of diabetes    Healthy Eating Plate Method    Being Active Role of exercise on diabetes management, blood pressure control and cardiac health.    Medications Other (comment)    Monitoring Other (comment)   A1C, lipids   Chronic complications Lipid levels, blood glucose control and heart disease    Diabetes Stress and Support Role of stress on diabetes    Lifestyle and Health Coping Lifestyle issues that need to be addressed for better diabetes care  Individualized Goals (developed by patient)   Nutrition Follow meal plan discussed    Physical Activity Exercise 3-5 times per week;60 minutes per day    Medications Other (comment)    Monitoring  Not Applicable    Problem Solving Sleep Pattern;Eating Pattern    Reducing Risk do foot checks daily    Health Coping Discuss barriers to diabetes care with support person/system (comment specifics as needed)      Post-Education Assessment   Patient understands the diabetes disease and treatment process. Needs Instruction    Patient understands incorporating nutritional management into lifestyle. Needs Instruction    Patient undertands incorporating physical activity into lifestyle. Needs Review    Patient understands using medications safely. Needs Instruction    Patient understands monitoring blood glucose, interpreting and using results Needs Instruction    Patient understands prevention, detection, and treatment of acute complications. Needs Instruction    Patient understands prevention, detection, and treatment of chronic complications. Needs Instruction    Patient understands how to develop strategies to address psychosocial issues. Needs Instruction    Patient understands how to develop strategies to promote health/change behavior. Needs Instruction      Outcomes   Expected Outcomes Demonstrated interest in learning. Expect positive outcomes    Future  DMSE 2 months    Program Status Not Completed             Individualized Plan for Diabetes Self-Management Training:   Learning Objective:  Patient will have a greater understanding of diabetes self-management. Patient education plan is to attend individual and/or group sessions per assessed needs and concerns.   Plan:   Patient Instructions  1- Use Nutrition Labels to decrease intake of saturated fats  Aim for 2 grams or less of saturated fat per serving  Also consider 2 grams daily of plant stanols/sterols   Expected Outcomes:  Demonstrated interest in learning. Expect positive outcomes  Education material provided: My Plate and Snack sheet, plant stanols, building a heart healthy plate   If problems or questions, patient to contact team via:  Phone  Future DSME appointment: 2 months

## 2023-06-04 ENCOUNTER — Other Ambulatory Visit: Payer: Self-pay

## 2023-06-04 DIAGNOSIS — E785 Hyperlipidemia, unspecified: Secondary | ICD-10-CM

## 2023-06-06 ENCOUNTER — Other Ambulatory Visit: Payer: Medicare Other

## 2023-06-06 DIAGNOSIS — E785 Hyperlipidemia, unspecified: Secondary | ICD-10-CM | POA: Diagnosis not present

## 2023-06-06 DIAGNOSIS — E1169 Type 2 diabetes mellitus with other specified complication: Secondary | ICD-10-CM

## 2023-06-07 LAB — MICROALBUMIN / CREATININE URINE RATIO
Creatinine, Urine: 66.6 mg/dL
Microalb/Creat Ratio: <5 mg/g{creat} (ref 0–29)
Microalbumin, Urine: <3 ug/mL

## 2023-06-20 ENCOUNTER — Encounter: Payer: Self-pay | Admitting: Internal Medicine

## 2023-07-21 ENCOUNTER — Ambulatory Visit: Payer: Self-pay | Admitting: Internal Medicine

## 2023-07-21 ENCOUNTER — Encounter: Payer: Self-pay | Admitting: Internal Medicine

## 2023-07-21 ENCOUNTER — Encounter: Payer: Medicare Other | Admitting: Internal Medicine

## 2023-07-21 ENCOUNTER — Ambulatory Visit (INDEPENDENT_AMBULATORY_CARE_PROVIDER_SITE_OTHER): Payer: Medicare Other | Admitting: Internal Medicine

## 2023-07-21 VITALS — BP 118/80 | HR 77 | Temp 98.2°F | Ht 62.0 in | Wt 153.0 lb

## 2023-07-21 DIAGNOSIS — E7800 Pure hypercholesterolemia, unspecified: Secondary | ICD-10-CM

## 2023-07-21 DIAGNOSIS — Z862 Personal history of diseases of the blood and blood-forming organs and certain disorders involving the immune mechanism: Secondary | ICD-10-CM | POA: Diagnosis not present

## 2023-07-21 DIAGNOSIS — J479 Bronchiectasis, uncomplicated: Secondary | ICD-10-CM

## 2023-07-21 DIAGNOSIS — E1169 Type 2 diabetes mellitus with other specified complication: Secondary | ICD-10-CM

## 2023-07-21 DIAGNOSIS — E785 Hyperlipidemia, unspecified: Secondary | ICD-10-CM | POA: Diagnosis not present

## 2023-07-21 DIAGNOSIS — D692 Other nonthrombocytopenic purpura: Secondary | ICD-10-CM | POA: Diagnosis not present

## 2023-07-21 DIAGNOSIS — Z87898 Personal history of other specified conditions: Secondary | ICD-10-CM

## 2023-07-21 DIAGNOSIS — E2839 Other primary ovarian failure: Secondary | ICD-10-CM | POA: Insufficient documentation

## 2023-07-21 MED ORDER — SCOPOLAMINE 1 MG/3DAYS TD PT72
1.0000 | MEDICATED_PATCH | TRANSDERMAL | 0 refills | Status: DC
Start: 2023-07-21 — End: 2023-08-27

## 2023-07-21 NOTE — Progress Notes (Signed)
 I,Victoria T Deloria Lair, CMA,acting as a Neurosurgeon for Gwynneth Aliment, MD.,have documented all relevant documentation on the behalf of Gwynneth Aliment, MD,as directed by  Gwynneth Aliment, MD while in the presence of Gwynneth Aliment, MD.  Subjective:  Patient ID: Tracey Castro , female    DOB: 11-12-1945 , 78 y.o.   MRN: [id number]  Chief Complaint  Patient presents with   Diabetes   Hyperlipidemia    HPI  Patient presents today for diabetes & cholesterol follow up. She currently does not take any prescribed medications for cholesterol. She denies headache, chest pain & sob.   She questions two diagnoses she saw in her chart via mychart. "Purpura: & "COPD mixed type". She wants to know what these diagnoses mean.   She is also going on a cruise this Friday. She would like rx for patches in case of motion sickness.  She reports completing DM eye exam last week. MRF given to patient.   AWV scheduled for 2/12.  Hyperlipidemia This is a chronic problem. The current episode started more than 1 year ago. The problem is uncontrolled. Pertinent negatives include no chest pain, leg pain or shortness of breath. Current antihyperlipidemic treatment includes diet change. Risk factors for coronary artery disease include dyslipidemia and post-menopausal.     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(HCC)      Family History  Problem Relation Age of Onset   Cancer - Ovarian Mother    Cancer - Colon Father    Parkinson's disease Father      Current Outpatient Medications:    ascorbic acid (VITAMIN C) 500 MG tablet, Take 500 mg by mouth daily., Disp: , Rfl:    Cholecalciferol (VITAMIN D) 2000 units tablet, Take 4,000-6,000 Units by mouth daily., Disp: , Rfl:    cyanocobalamin (VITAMIN B12) 1000 MCG tablet, Take 1,000 mcg by mouth daily., Disp: , Rfl:    diphenhydrAMINE HCl, Sleep, 25 MG CAPS, Take 25 mg by mouth daily as needed (sleep)., Disp: ,  Rfl:    Ferrous Gluconate-C-Folic Acid (IRON-C PO), Take 65 mg by mouth., Disp: , Rfl:    ibuprofen (ADVIL,MOTRIN) 200 MG tablet, Take 600 mg by mouth 2 (two) times daily as needed for headache or moderate pain., Disp: , Rfl:    Magnesium 500 MG CAPS, Take 120 mg by mouth daily., Disp: , Rfl:    Multiple Vitamin (MULTIVITAMIN) capsule, Take 1 capsule by mouth daily., Disp: , Rfl:    scopolamine (TRANSDERM SCOP, 1.5 MG,) 1 MG/3DAYS, Place 1 patch (1.5 mg total) onto the skin every 3 (three) days., Disp: 10 patch, Rfl: 0   acetaminophen (TYLENOL) 325 MG tablet, Take 2 tablets (650 mg total) by mouth every 6 (six) hours as needed. (Patient not taking: Reported on 07/21/2023), Disp: 36 tablet, Rfl: 0   Allergies  Allergen Reactions   Augmentin [Amoxicillin-Pot Clavulanate] Nausea And Vomiting   Sulfamethoxazole Rash     Review of Systems  Constitutional: Negative.   Respiratory: Negative.  Negative for shortness of breath.   Cardiovascular: Negative.  Negative for chest pain.  Gastrointestinal: Negative.   Neurological: Negative.   Psychiatric/Behavioral: Negative.       Today's Vitals   07/21/23 1040  BP: 118/80  Pulse: 77  Temp: 98.2 F (36.8 C)  SpO2: 98%  Weight: 153 lb (69.4 kg)  Height: 5\' 2"  (1.575 m)   Body mass index is 27.98 kg/m.  Wt  Readings from Last 3 Encounters:  07/21/23 153 lb (69.4 kg)  04/28/23 150 lb (68 kg)  04/01/23 149 lb 12.8 oz (67.9 kg)     Objective:  Physical Exam Vitals and nursing note reviewed.  Constitutional:      Appearance: Normal appearance.  HENT:     Head: Normocephalic and atraumatic.  Eyes:     Extraocular Movements: Extraocular movements intact.  Cardiovascular:     Rate and Rhythm: Normal rate and regular rhythm.     Heart sounds: Normal heart sounds.  Pulmonary:     Effort: Pulmonary effort is normal.     Breath sounds: Normal breath sounds.  Skin:    General: Skin is warm.  Neurological:     General: No focal deficit  present.     Mental Status: She is alert.  Psychiatric:        Mood and Affect: Mood normal.        Behavior: Behavior normal.         Assessment And Plan:  Dyslipidemia due to type 2 diabetes mellitus [facility]) Assessment & Plan: Feb 2024 A1c 6.5.  She does not wish to take rx meds. She has had Nutrition evaluation; however, doesn't feel provider listened to her questions and concerns.  She prefers to control with lifestyle changes.    Orders: -     CMP14+EGFR -     Hemoglobin A1c  Purpura (HCC) Assessment & Plan: She has had previous episodes of bruising, she has no concerns about this today.    Bronchiectasis without complication [facility]) Assessment & Plan: She has been evaluated by Pulmonary. She has been prescribed Breo in the past; however, this is not on her active med list. She is encouraged to avoid known triggers.     History of anemia -     CBC  H/O motion sickness -     Scopolamine; Place 1 patch (1.5 mg total) onto the skin every 3 (three) days.  Dispense: 10 patch; Refill: 0     Return in 4 months (on 11/18/2023), or pls cancel April appt, 4 months chol check OR phys, pt preference.  Patient was given opportunity to ask questions. Patient verbalized understanding of the plan and was able to repeat key elements of the plan. All questions were answered to their satisfaction.    I, Gwynneth Aliment, MD, have reviewed all documentation for this visit. The documentation on 07/21/23 for the exam, diagnosis, procedures, and orders are all accurate and complete.   IF YOU HAVE BEEN REFERRED TO A SPECIALIST, IT MAY TAKE 1-2 WEEKS TO SCHEDULE/PROCESS THE REFERRAL. IF YOU HAVE NOT HEARD FROM US/SPECIALIST IN TWO WEEKS, PLEASE GIVE [place] A CALL AT [phone].   THE PATIENT IS ENCOURAGED TO PRACTICE SOCIAL DISTANCING DUE TO THE COVID-19 PANDEMIC.

## 2023-07-21 NOTE — Assessment & Plan Note (Addendum)
 Feb 2024 A1c 6.5.  She does not wish to take rx meds. She has had Nutrition evaluation; however, doesn't feel provider listened to her questions and concerns.  She prefers to control with lifestyle changes.

## 2023-07-21 NOTE — Patient Instructions (Signed)

## 2023-07-22 LAB — CBC
Hematocrit: 40.9 % (ref 34.0–46.6)
Hemoglobin: 13.1 g/dL (ref 11.1–15.9)
MCH: 28.2 pg (ref 26.6–33.0)
MCHC: 32 g/dL (ref 31.5–35.7)
MCV: 88 fL (ref 79–97)
Platelets: 361 10*3/uL (ref 150–450)
RBC: 4.65 x10E6/uL (ref 3.77–5.28)
RDW: 13.4 % (ref 11.7–15.4)
WBC: 8.1 10*3/uL (ref 3.4–10.8)

## 2023-07-22 LAB — CMP14+EGFR
ALT: 19 [IU]/L (ref 0–32)
AST: 17 [IU]/L (ref 0–40)
Albumin: 4.2 g/dL (ref 3.8–4.8)
Alkaline Phosphatase: 105 [IU]/L (ref 44–121)
BUN/Creatinine Ratio: 21 (ref 12–28)
BUN: 13 mg/dL (ref 8–27)
Bilirubin Total: 0.5 mg/dL (ref 0.0–1.2)
CO2: 22 mmol/L (ref 20–29)
Calcium: 9.3 mg/dL (ref 8.7–10.3)
Chloride: 105 mmol/L (ref 96–106)
Creatinine, Ser: 0.61 mg/dL (ref 0.57–1.00)
Globulin, Total: 2.3 g/dL (ref 1.5–4.5)
Glucose: 88 mg/dL (ref 70–99)
Potassium: 4.4 mmol/L (ref 3.5–5.2)
Sodium: 141 mmol/L (ref 134–144)
Total Protein: 6.5 g/dL (ref 6.0–8.5)
eGFR: 91 mL/min/{1.73_m2} (ref >59)

## 2023-07-22 LAB — HEMOGLOBIN A1C
Est. average glucose Bld gHb Est-mCnc: 137 mg/dL
Hgb A1c MFr Bld: 6.4 % — ABNORMAL HIGH (ref 4.8–5.6)

## 2023-08-03 NOTE — Assessment & Plan Note (Signed)
 She has had previous episodes of bruising, she has no concerns about this today.

## 2023-08-03 NOTE — Assessment & Plan Note (Signed)
 She has been evaluated by Pulmonary. She has been prescribed Breo in the past; however, this is not on her active med list. She is encouraged to avoid known triggers.

## 2023-08-11 ENCOUNTER — Encounter: Payer: Self-pay | Admitting: Nurse Practitioner

## 2023-08-11 NOTE — Telephone Encounter (Signed)
 You can check the Registry to see if she has had these vaccines. I believe she means MMR and not MMB. I do suggest she come in for MMR titers.  RS

## 2023-08-12 ENCOUNTER — Ambulatory Visit (INDEPENDENT_AMBULATORY_CARE_PROVIDER_SITE_OTHER): Payer: PRIVATE HEALTH INSURANCE

## 2023-08-18 ENCOUNTER — Ambulatory Visit: Payer: PRIVATE HEALTH INSURANCE | Admitting: Primary Care

## 2023-08-18 ENCOUNTER — Telehealth (INDEPENDENT_AMBULATORY_CARE_PROVIDER_SITE_OTHER): Payer: Self-pay | Admitting: Otolaryngology

## 2023-08-18 NOTE — Telephone Encounter (Signed)
 Confirmed appt & location [id number] afm

## 2023-08-19 ENCOUNTER — Ambulatory Visit (INDEPENDENT_AMBULATORY_CARE_PROVIDER_SITE_OTHER): Payer: PRIVATE HEALTH INSURANCE | Admitting: Otolaryngology

## 2023-08-19 ENCOUNTER — Encounter (INDEPENDENT_AMBULATORY_CARE_PROVIDER_SITE_OTHER): Payer: Self-pay

## 2023-08-19 VITALS — BP 109/68 | HR 76 | Ht 62.0 in | Wt 148.0 lb

## 2023-08-19 DIAGNOSIS — H9313 Tinnitus, bilateral: Secondary | ICD-10-CM | POA: Diagnosis not present

## 2023-08-19 DIAGNOSIS — H903 Sensorineural hearing loss, bilateral: Secondary | ICD-10-CM | POA: Diagnosis not present

## 2023-08-19 NOTE — Progress Notes (Unsigned)
 Patient ID: Tracey Castro, female   DOB: 10/20/1945, 78 y.o.   MRN: [id number]  Follow-up: Hearing loss, tinnitus  The patient is a 78 year old female who presents today for follow-up evaluation of her hearing loss and bilateral tinnitus.  She was previously seen in February 2024.  At that time, she was complaining of bilateral hearing loss.  She was noted to have bilateral moderate high-frequency sensorineural hearing loss.  She was fitted with bilateral hearing aids.  She also has a history of bilateral constant high-pitched tinnitus.  The tinnitus is nonpulsatile.  The patient returns today reporting no significant change in her hearing loss or tinnitus.  she denies any otalgia or otorrhea.  She is wearing her hearing aids bilaterally.  Exam: General: Communicates without difficulty, well nourished, no acute distress. Head: Normocephalic, no evidence injury, no tenderness, facial buttresses intact without stepoff. Eyes: PERRL, EOMI. No scleral icterus, conjunctivae clear. Neuro: CN II exam reveals vision grossly intact.  No nystagmus at any point of gaze. Ears: Auricles well formed without lesions.  Ear canals are intact without mass or lesion.  No erythema or edema is appreciated.  The TMs are intact without fluid. Nose: External evaluation reveals normal support and skin without lesions.  Dorsum is intact.  Anterior rhinoscopy reveals healthy pink mucosa over anterior aspect of inferior turbinates and intact septum.  No purulence noted. Oral:  Oral cavity and oropharynx are intact, symmetric, without erythema or edema.  Mucosa is moist without lesions. Neck: Full range of motion without pain.  There is no significant lymphadenopathy.  No masses palpable.  Thyroid bed within normal limits to palpation.  Parotid glands and submandibular glands equal bilaterally without mass.  Trachea is midline. Neuro:  CN 2-12 grossly intact. Gait normal. Vestibular: No nystagmus at any point of gaze. The cerebellar  examination is unremarkable.   Assessment: 1. The patient's ear canals, tympanic membranes and middle ear spaces are all normal.  2.  Subjectively stable bilateral high-frequency sensorineural hearing loss, consistent with presbycusis.  3. The patient's bilateral tinnitus is likely secondary to her sensorineural hearing loss.   Plan:  1. The physical exam findings are reviewed with the patient.  2.  Continue the use of her hearing aids. 3. Strategies to cope with tinnitus, which include the use of masker, hearing aids, avoidance of caffeine and alcohol, and tinnitus retraining therapy are discussed with the patient.  4. The patient will return for reevaluation in approximately 12 months.

## 2023-08-20 ENCOUNTER — Encounter: Payer: Self-pay | Admitting: Primary Care

## 2023-08-20 DIAGNOSIS — H903 Sensorineural hearing loss, bilateral: Secondary | ICD-10-CM | POA: Insufficient documentation

## 2023-08-20 DIAGNOSIS — H9313 Tinnitus, bilateral: Secondary | ICD-10-CM | POA: Insufficient documentation

## 2023-08-27 ENCOUNTER — Encounter: Payer: Self-pay | Admitting: Primary Care

## 2023-08-27 ENCOUNTER — Ambulatory Visit: Payer: PRIVATE HEALTH INSURANCE | Admitting: Primary Care

## 2023-08-27 VITALS — BP 103/58 | HR 77 | Temp 97.1°F | Ht 62.0 in | Wt 153.8 lb

## 2023-08-27 DIAGNOSIS — J479 Bronchiectasis, uncomplicated: Secondary | ICD-10-CM | POA: Diagnosis not present

## 2023-08-27 DIAGNOSIS — J449 Chronic obstructive pulmonary disease, unspecified: Secondary | ICD-10-CM

## 2023-08-27 NOTE — Patient Instructions (Signed)
 -  BRONCHIECTASIS: Bronchiectasis is a condition where the airways in the lungs become widened and scarred, leading to mucus build-up and breathing difficulties. You experience shortness of breath mainly during exertion. We recommend trying Airsupra for short-acting relief as needed, pending insurance coverage. If Paulene Floor is not covered, you can use Symbicort 80 mcg, two puffs daily as needed. Additionally, we discussed using an aero chamber to improve the effectiveness of your inhaler.  INSTRUCTIONS: Please check with your insurance regarding coverage for Airsupra (budesonide-albuterol) If it is not covered, start using Symbicort 80 mcg (budesonide-formoterol) two puffs 1-2 times daily as needed. Also, consider using an aero chamber with your inhaler for better results.   Follow-up 6 months with [facility] NP or sooner if needed

## 2023-08-27 NOTE — Progress Notes (Signed)
 @Patient  ID: Sabino Dick, female    DOB: [birth year], 78 y.o.   MRN: [id number]  No chief complaint on file.   Referring provider: Dorothyann Peng, MD  HPI: 78 year old female, never smoked.  Past medical history significant for bronchiectasis, chronic cough.  Patient of Dr. Vassie Loll, last seen by pulmonary nurse practitioner on 06/19/2021. MAC has been ruled out by bronchoscopy in the past, more likely that bronchiectasis is sequelae of repeated infections  08/27/2023- INTERIM  Patient presents today for overdue follow-up bronchiectasis without complication.   Discussed the use of AI scribe software for clinical note transcription with the patient, who gave verbal consent to proceed.  History of Present Illness   The patient, with bronchiectasis, presents with shortness of breath.  She experiences shortness of breath primarily with exertion, such as walking up a flight of stairs or hiking up hills. No chronic cough is present, but she occasionally experiences wheezing. She describes feeling tightness when walking in cold weather, especially after consuming alcohol.  She has been using Breo as needed, but often goes without it, as her symptoms do not always necessitate its use. She used to take Breo daily but has reduced its use over the past six months, using it only as needed, such as before gym classes or during vacations. She has been exploring alternative medications due to the cost of Breo and has been in contact with [facility] for alternatives. Alternatives like Symbicort and Advair Disc were suggested, but she prefers medications that can be used as needed rather than on a strict schedule. No significant change in her breathing since reducing the use of Breo.  No cough with colored mucus and no history of smoking, although she tried it when she was very young. She is a Human resources officer.        TEST/EVENTS:  02/03/2018 CT chest: Focal dense chronic appearing consolidations  within the lingula and right middle lobe, with associated traction bronchiectasis 02/19/2018: Quant TB negative 03/18/2018: Quant Ig's nl, A1AT level 128, MM 03/18/2018: allergen profile Eos 100, IgE 4 RAST neg 03/18/2018 Spirometry: FEV1 1 (49), ratio 70 03/27/2018 FOB: minimal secretions, BAL lingula negative cytology, AFB and fungus.  05/08/2018 PFTs: FEV1 1.3 (65), ratio 65, 14% improvement, DLCO 67% corrects to 91% for alveolar volume 09/04/2018 spirometry: Worsening airway obstruction with ratio 69, FEV1 52%, FVC 57% 05/2020 PFTs: Ratio 72, FEV1 64%, FVC 67%, TLC normal, DLCO normal 11/2020 CT chest with contrast: Bronchiectasis and scarring in right middle lobe and lingula, stable  Allergies  Allergen Reactions   Augmentin [Amoxicillin-Pot Clavulanate] Nausea And Vomiting   Sulfamethoxazole Rash    Immunization History  Administered Date(s) Administered   Fluad Quad(high Dose 65+) 03/18/2019, 05/07/2021, 04/01/2022   Influenza, High Dose Seasonal PF 06/03/2018   Influenza-Unspecified 04/17/2013   PFIZER(Purple Top)SARS-COV-2 Vaccination 07/08/2019, 07/29/2019, 04/18/2020   Pfizer Covid-19 Vaccine Bivalent Booster 32yrs & up 03/23/2021, 04/30/2022, 03/04/2023   Pneumococcal Polysaccharide-23 03/28/2015   Tdap 08/28/2020   Zoster Recombinant(Shingrix) 03/21/2022    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(HCC)     Tobacco History: Social History   Tobacco Use  Smoking Status Never  Smokeless Tobacco Never   Counseling given: Not Answered   Outpatient Medications Prior to Visit  Medication Sig Dispense Refill   acetaminophen (TYLENOL) 325 MG tablet Take 2 tablets (650 mg total) by mouth every 6 (six) hours as needed. (Patient not taking: Reported on 08/19/2023) 36 tablet  0   ascorbic acid (VITAMIN C) 500 MG tablet Take 500 mg by mouth daily.     Cholecalciferol (VITAMIN D) 2000 units tablet Take 4,000-6,000 Units by mouth  daily.     cyanocobalamin (VITAMIN B12) 1000 MCG tablet Take 1,000 mcg by mouth daily.     diphenhydrAMINE HCl, Sleep, 25 MG CAPS Take 25 mg by mouth daily as needed (sleep).     Ferrous Gluconate-C-Folic Acid (IRON-C PO) Take 65 mg by mouth.     ibuprofen (ADVIL,MOTRIN) 200 MG tablet Take 600 mg by mouth 2 (two) times daily as needed for headache or moderate pain.     Magnesium 500 MG CAPS Take 120 mg by mouth daily.     Multiple Vitamin (MULTIVITAMIN) capsule Take 1 capsule by mouth daily.     scopolamine (TRANSDERM SCOP, 1.5 MG,) 1 MG/3DAYS Place 1 patch (1.5 mg total) onto the skin every 3 (three) days. 10 patch 0   No facility-administered medications prior to visit.    Review of Systems  Review of Systems  Constitutional: Negative.  Negative for fatigue.  HENT: Negative.    Respiratory:  Positive for shortness of breath. Negative for cough.   Cardiovascular: Negative.    Physical Exam  There were no vitals taken for this visit. Physical Exam Constitutional:      Appearance: Normal appearance.  HENT:     Head: Normocephalic and atraumatic.     Mouth/Throat:     Mouth: Mucous membranes are moist.     Pharynx: Oropharynx is clear.  Cardiovascular:     Rate and Rhythm: Normal rate and regular rhythm.  Pulmonary:     Effort: Pulmonary effort is normal.     Breath sounds: Normal breath sounds. No rhonchi or rales.  Skin:    General: Skin is warm and dry.  Neurological:     General: No focal deficit present.     Mental Status: She is alert and oriented to person, place, and time. Mental status is at baseline.  Psychiatric:        Mood and Affect: Mood normal.        Behavior: Behavior normal.        Thought Content: Thought content normal.        Judgment: Judgment normal.      Lab Results:  CBC    Component Value Date/Time   WBC 8.1 07/21/2023 1154   WBC 9.3 12/15/2020 1231   RBC 4.65 07/21/2023 1154   RBC 3.68 (L) 12/15/2020 1231   HGB 13.1 07/21/2023 1154    HCT 40.9 07/21/2023 1154   PLT 361 07/21/2023 1154   MCV 88 07/21/2023 1154   MCH 28.2 07/21/2023 1154   MCH 29.1 12/15/2020 1231   MCHC 32.0 07/21/2023 1154   MCHC 32.4 12/15/2020 1231   RDW 13.4 07/21/2023 1154   LYMPHSABS 2.1 06/19/2021 0853   MONOABS 0.9 12/15/2020 1231   EOSABS 0.1 06/19/2021 0853   BASOSABS 0.1 06/19/2021 0853    BMET    Component Value Date/Time   NA 141 07/21/2023 1154   K 4.4 07/21/2023 1154   CL 105 07/21/2023 1154   CO2 22 07/21/2023 1154   GLUCOSE 88 07/21/2023 1154   GLUCOSE 105 (H) 12/15/2020 1231   BUN 13 07/21/2023 1154   CREATININE 0.61 07/21/2023 1154   CALCIUM 9.3 07/21/2023 1154   GFRNONAA >60 12/15/2020 1231   GFRAA 98 06/20/2020 0922    BNP    Component Value Date/Time   BNP  133.6 (H) 12/15/2020 1231    ProBNP No results found for: "PROBNP"  Imaging: No results found.   Assessment & Plan:   1. Bronchiectasis without complication (HCC) (Primary) - Pulmonary function test; Future  2. COPD GOLD  0/ AB  - Pulmonary function test; Future     Bronchiectasis Intermittent dyspnea on exertion, no significant impact on daily activities. No significant cough. She would like to consider alternative to [facility] due to cough. She has reduced use ovr the last 6 months without exacerbation. Airsupra preferred for short-acting bronchodilation. - Recommend Airsupra for as-needed use, pending insurance coverage. - Provide copay card for Paulene Floor, advise insurance check. - If Airsupra not covered, use Symbicort 80 mcg, two puffs 1-2 times daily as needed. - Discuss aero chamber for inhaler efficacy.  - Get updated pulmonary function testing in 6 months - No indication for chest imaging at this time      Glenford Bayley, NP 08/27/2023

## 2023-09-16 ENCOUNTER — Encounter: Payer: Self-pay | Admitting: Internal Medicine

## 2023-09-16 ENCOUNTER — Telehealth: Payer: Self-pay

## 2023-09-24 ENCOUNTER — Other Ambulatory Visit: Payer: Self-pay

## 2023-09-24 MED ORDER — AIRSUPRA 90-80 MCG/ACT IN AERO: 2.0000 | INHALATION_SPRAY | Freq: Every day | RESPIRATORY_TRACT | 0 refills | Status: DC

## 2023-09-24 NOTE — Progress Notes (Signed)
 I left a detailed message to [facility] about cancelling the Airsupra medication as it was sent in wrong. I gave them our phone number for any questions or concerns.

## 2023-09-30 ENCOUNTER — Ambulatory Visit: Admitting: Internal Medicine

## 2023-09-30 ENCOUNTER — Ambulatory Visit: Payer: Medicare Other | Admitting: Internal Medicine

## 2023-10-02 NOTE — Telephone Encounter (Signed)
 ERROR

## 2023-10-02 NOTE — Telephone Encounter (Signed)
erreor

## 2023-10-04 ENCOUNTER — Other Ambulatory Visit: Payer: Self-pay | Admitting: Primary Care

## 2023-10-04 MED ORDER — AIRSUPRA 90-80 MCG/ACT IN AERO: 2.0000 | INHALATION_SPRAY | Freq: Four times a day (QID) | RESPIRATORY_TRACT | 1 refills | Status: DC | PRN

## 2023-10-04 NOTE — Telephone Encounter (Signed)
 Changes Requested   DULERA 50-5 MCG/ACT AERO       Changed from: Albuterol-Budesonide  (AIRSUPRA ) 90-80 MCG/ACT AERO   All pharmacy suggested alternatives are listed below   Sig: N/A   Disp: Not specified    Refills: 0   Start: 10/04/2023   Class: Normal   Non-formulary   Last ordered: Today (10/04/2023) by Antonio Baumgarten, NP   Last refill: 10/04/2023   Rx #: [id number]   Pharmacy comment: Alternative Requested:THE PRESCRIBED MEDICATION IS NOT COVERED BY INSURANCE. PLEASE CONSIDER CHANGING TO ONE OF THE SUGGESTED COVERED ALTERNATIVES.  All Pharmacy Suggested Alternatives:  Mometasone Furo-Formoterol  Fum (DULERA) 50-5 MCG/ACT AERO ciclesonide (ALVESCO) 80 MCG/ACT inhaler albuterol (VENTOLIN HFA) 108 (90 Base) MCG/ACT inhaler budesonide -formoterol  (BREYNA ) 80-4.5 MCG/ACT inhaler mometasone (ASMANEX, 120 METERED DOSES,) 220 MCG/ACT inhaler      Beth, please advise on this.

## 2023-10-06 ENCOUNTER — Telehealth: Payer: Self-pay

## 2023-10-06 ENCOUNTER — Other Ambulatory Visit (HOSPITAL_COMMUNITY): Payer: Self-pay

## 2023-10-06 NOTE — Telephone Encounter (Signed)
 Airsupra  is not covered, advise patient to use Symbicort  as needed 1-2 puffs q12 hours as needed

## 2023-10-06 NOTE — Telephone Encounter (Signed)
*  Pulm  Pharmacy Patient Advocate Encounter   Received notification from CoverMyMeds that prior authorization for Airsupra  90-80MCG/ACT aerosol  is required/requested.   Insurance verification completed.   The patient is insured through [employer] .   Per test claim:  Albuterol HFA is preferred by the insurance.  If suggested medication is appropriate, Please send in a new RX and discontinue this one. If not, please advise as to why it's not appropriate so that we may request a Prior Authorization. Please note, some preferred medications may still require a PA.  If the suggested medications have not been trialed and there are no contraindications to their use, the PA will not be submitted, as it will not be approved.   CMM Key: [id number]

## 2023-10-10 ENCOUNTER — Other Ambulatory Visit (HOSPITAL_BASED_OUTPATIENT_CLINIC_OR_DEPARTMENT_OTHER): Payer: Self-pay

## 2023-10-10 MED ORDER — HAVRIX 1440 EL U/ML IM SUSP
1.0000 mL | Freq: Once | INTRAMUSCULAR | 0 refills | Status: AC
  Filled 2023-10-10: qty 1, 1d supply, fill #0

## 2023-10-14 ENCOUNTER — Other Ambulatory Visit (HOSPITAL_BASED_OUTPATIENT_CLINIC_OR_DEPARTMENT_OTHER): Payer: Self-pay

## 2023-10-20 ENCOUNTER — Other Ambulatory Visit (HOSPITAL_BASED_OUTPATIENT_CLINIC_OR_DEPARTMENT_OTHER): Payer: Self-pay

## 2023-10-20 MED ORDER — BUDESONIDE-FORMOTEROL FUMARATE 80-4.5 MCG/ACT IN AERO
1.0000 | INHALATION_SPRAY | Freq: Two times a day (BID) | RESPIRATORY_TRACT | 1 refills | Status: DC | PRN
  Filled 2023-10-20: qty 10.2, 60d supply, fill #0

## 2023-10-20 MED ORDER — SPACER/AERO-HOLDING CHAMBERS DEVI
0 refills | Status: AC
  Filled 2023-10-20: qty 1, 30d supply, fill #0

## 2023-10-20 NOTE — Addendum Note (Signed)
 Addended by: Kary Pages on: 10/20/2023 09:35 AM   Modules accepted: Orders

## 2023-10-21 ENCOUNTER — Encounter: Payer: Self-pay | Admitting: Internal Medicine

## 2023-10-21 ENCOUNTER — Other Ambulatory Visit: Payer: Self-pay | Admitting: Internal Medicine

## 2023-10-21 MED ORDER — MEFLOQUINE HCL 250 MG PO TABS: ORAL_TABLET | ORAL | 0 refills | Status: DC

## 2023-10-22 NOTE — Telephone Encounter (Signed)
 Please let patient know insurance denied Airsupra  Continue to use Symbicort  80mcg 1-2 puffs as needed q 12 hours - if needs refill we can send in

## 2023-10-27 ENCOUNTER — Other Ambulatory Visit: Payer: Self-pay | Admitting: Internal Medicine

## 2023-11-04 ENCOUNTER — Telehealth: Payer: Self-pay

## 2023-11-04 ENCOUNTER — Other Ambulatory Visit (HOSPITAL_BASED_OUTPATIENT_CLINIC_OR_DEPARTMENT_OTHER): Payer: Self-pay

## 2023-11-04 ENCOUNTER — Other Ambulatory Visit: Payer: Self-pay | Admitting: Internal Medicine

## 2023-11-04 MED ORDER — ATOVAQUONE-PROGUANIL HCL 250-100 MG PO TABS
1.0000 | ORAL_TABLET | Freq: Every day | ORAL | 0 refills | Status: DC
  Filled 2023-11-04: qty 31, 31d supply, fill #0

## 2023-11-04 NOTE — Telephone Encounter (Signed)
 Patient advised that her prescription for malaria pills has been called in for her. She was advised she would start taking the medication on 11/18/23 once daily until 12/19/23. Patient expressed understanding. YL,RMA

## 2023-11-11 ENCOUNTER — Other Ambulatory Visit (HOSPITAL_BASED_OUTPATIENT_CLINIC_OR_DEPARTMENT_OTHER): Payer: Self-pay

## 2023-11-11 DIAGNOSIS — Z23 Encounter for immunization: Secondary | ICD-10-CM | POA: Diagnosis not present

## 2023-11-11 MED ORDER — COVID-19 MRNA VAC-TRIS(PFIZER) 30 MCG/0.3ML IM SUSY
0.3000 mL | PREFILLED_SYRINGE | Freq: Once | INTRAMUSCULAR | 0 refills | Status: AC
  Filled 2023-11-11: qty 0.3, 1d supply, fill #0

## 2023-11-17 ENCOUNTER — Other Ambulatory Visit: Payer: Self-pay

## 2023-11-19 ENCOUNTER — Other Ambulatory Visit: Payer: Self-pay

## 2023-11-19 ENCOUNTER — Encounter: Payer: Medicare Other | Admitting: Internal Medicine

## 2023-11-19 DIAGNOSIS — E785 Hyperlipidemia, unspecified: Secondary | ICD-10-CM

## 2023-12-24 ENCOUNTER — Other Ambulatory Visit: Payer: Self-pay | Admitting: Internal Medicine

## 2023-12-24 DIAGNOSIS — Z1231 Encounter for screening mammogram for malignant neoplasm of breast: Secondary | ICD-10-CM

## 2023-12-29 ENCOUNTER — Telehealth: Payer: Self-pay | Admitting: Internal Medicine

## 2023-12-29 DIAGNOSIS — H2511 Age-related nuclear cataract, right eye: Secondary | ICD-10-CM | POA: Diagnosis not present

## 2023-12-29 DIAGNOSIS — H25013 Cortical age-related cataract, bilateral: Secondary | ICD-10-CM | POA: Diagnosis not present

## 2023-12-29 DIAGNOSIS — H2513 Age-related nuclear cataract, bilateral: Secondary | ICD-10-CM | POA: Diagnosis not present

## 2023-12-29 DIAGNOSIS — H25043 Posterior subcapsular polar age-related cataract, bilateral: Secondary | ICD-10-CM | POA: Diagnosis not present

## 2023-12-29 NOTE — Telephone Encounter (Unsigned)
 Copied from CRM [id number]. Topic: Appointments - Scheduling Inquiry for Clinic >> Dec 29, 2023 11:45 AM Donee H wrote: Reason for CRM: Patient would like to have labs due prior to follow up appointment. Patient is requesting to have labs due on July 18 around 8:30am if possible

## 2023-12-30 ENCOUNTER — Ambulatory Visit: Payer: PRIVATE HEALTH INSURANCE | Admitting: Internal Medicine

## 2023-12-31 ENCOUNTER — Ambulatory Visit: Payer: Self-pay | Admitting: Internal Medicine

## 2024-01-05 ENCOUNTER — Other Ambulatory Visit

## 2024-01-05 DIAGNOSIS — E785 Hyperlipidemia, unspecified: Secondary | ICD-10-CM | POA: Diagnosis not present

## 2024-01-05 DIAGNOSIS — E1169 Type 2 diabetes mellitus with other specified complication: Secondary | ICD-10-CM | POA: Diagnosis not present

## 2024-01-05 DIAGNOSIS — E7800 Pure hypercholesterolemia, unspecified: Secondary | ICD-10-CM | POA: Diagnosis not present

## 2024-01-05 LAB — BMP8+EGFR
BUN/Creatinine Ratio: 22 (ref 12–28)
BUN: 15 mg/dL (ref 8–27)
CO2: 20 mmol/L (ref 20–29)
Calcium: 9.1 mg/dL (ref 8.7–10.3)
Chloride: 106 mmol/L (ref 96–106)
Creatinine, Ser: 0.69 mg/dL (ref 0.57–1.00)
Glucose: 97 mg/dL (ref 70–99)
Potassium: 4.2 mmol/L (ref 3.5–5.2)
Sodium: 142 mmol/L (ref 134–144)
eGFR: 88 mL/min/1.73 (ref >59)

## 2024-01-05 LAB — HEMOGLOBIN A1C
Est. average glucose Bld gHb Est-mCnc: 128 mg/dL
Hgb A1c MFr Bld: 6.1 % — ABNORMAL HIGH (ref 4.8–5.6)

## 2024-01-06 ENCOUNTER — Encounter: Payer: Self-pay | Admitting: Internal Medicine

## 2024-01-06 ENCOUNTER — Ambulatory Visit (INDEPENDENT_AMBULATORY_CARE_PROVIDER_SITE_OTHER): Payer: PRIVATE HEALTH INSURANCE | Admitting: Internal Medicine

## 2024-01-06 ENCOUNTER — Ambulatory Visit: Payer: Self-pay | Admitting: Internal Medicine

## 2024-01-06 VITALS — BP 110/70 | HR 69 | Temp 97.7°F | Ht 62.0 in | Wt 153.2 lb

## 2024-01-06 DIAGNOSIS — R7303 Prediabetes: Secondary | ICD-10-CM

## 2024-01-06 DIAGNOSIS — E7800 Pure hypercholesterolemia, unspecified: Secondary | ICD-10-CM

## 2024-01-06 DIAGNOSIS — F5104 Psychophysiologic insomnia: Secondary | ICD-10-CM

## 2024-01-06 DIAGNOSIS — J479 Bronchiectasis, uncomplicated: Secondary | ICD-10-CM

## 2024-01-06 NOTE — Patient Instructions (Signed)

## 2024-01-06 NOTE — Progress Notes (Signed)
 I,Tracey Castro, CMA,acting as a Neurosurgeon for Tracey LOISE Slocumb, MD.,have documented all relevant documentation on the behalf of Tracey LOISE Slocumb, MD,as directed by  Tracey LOISE Slocumb, MD while in the presence of Tracey LOISE Slocumb, MD.  Subjective:  Patient ID: Tracey Castro , female    DOB: May 21, 1946 , 78 y.o.   MRN: [id number]  Chief Complaint  Patient presents with   Diabetes    Patient presents today for dm follow up. She reports compliance with medications. Denies headache, chest pain & sob. She would like to go over BMP panel of blood work done yesterday.     HPI Discussed the use of AI scribe software for clinical note transcription with the patient, who gave verbal consent to proceed.  History of Present Illness Tracey Castro Tracey Castro is a 78 year old female who presents for a follow-up visit regarding prediabetes.  She has been actively implementing lifestyle modifications, including exercising at least three times a week and incorporating strength training twice weekly. She has also adjusted her diet by reducing sugar and fat intake, and limiting bread and cold cuts. She feels well and is pleased with the decrease in her glucose levels.  She takes magnesium a couple of times a week to manage bowel regularity, as daily intake causes discomfort. She also takes half of a sleeping pill from [employer] at bedtime, which aids her sleep, although she often wakes at 2:30 AM to urinate. Rolling over to her left side at night prompts the need to urinate.  Regarding her bronchiectasis, she has been swimming and increasing her lap count, which she finds beneficial. She recently switched from Breo to Symbicort , which she reports is more effective for her symptoms. She has a scheduled breathing test in October.  She maintains a diet with whole grain and whole wheat bread, as well as brown rice, to ensure adequate fiber intake, which she finds important for her bowel  movements. While traveling in June, she noticed a difference in her bowel habits due to dietary changes but managed it with magnesium.  She has a mammogram scheduled for August and underwent bone density testing last year, with the next one due in two years. She is also scheduled for cataract surgery in October.   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(HCC)      Family History  Problem Relation Age of Onset   Cancer - Ovarian Mother    Cancer - Colon Father    Parkinson's disease Father      Current Outpatient Medications:    Albuterol-Budesonide  (AIRSUPRA ) 90-80 MCG/ACT AERO, Inhale 2 puffs into the lungs every 6 (six) hours as needed., Disp: 10.7 g, Rfl: 1   ascorbic acid (VITAMIN C) 500 MG tablet, Take 500 mg by mouth daily., Disp: , Rfl:    budesonide -formoterol  (SYMBICORT ) 80-4.5 MCG/ACT inhaler, Inhale 1 puff into the lungs up to 2 times daily as needed., Disp: 30.6 g, Rfl: 1   Cholecalciferol (VITAMIN D ) 2000 units tablet, Take 4,000-6,000 Units by mouth daily., Disp: , Rfl:    cyanocobalamin  (VITAMIN B12) 1000 MCG tablet, Take 1,000 mcg by mouth daily., Disp: , Rfl:    diphenhydrAMINE HCl, Sleep, 25 MG CAPS, Take 25 mg by mouth daily as needed (sleep)., Disp: , Rfl:    Ferrous Gluconate-C-Folic Acid (IRON-C PO), Take 65 mg by mouth., Disp: , Rfl:    ibuprofen (ADVIL,MOTRIN) 200 MG tablet, Take 600 mg  by mouth 2 (two) times daily as needed for headache or moderate pain., Disp: , Rfl:    Magnesium 500 MG CAPS, Take 120 mg by mouth daily., Disp: , Rfl:    Multiple Vitamin (MULTIVITAMIN) capsule, Take 1 capsule by mouth daily., Disp: , Rfl:    Spacer/Aero-Holding Chambers DEVI, Use as directed with Symbicort  inhaler., Disp: 1 each, Rfl: 0   Allergies  Allergen Reactions   Augmentin  [Amoxicillin -Pot Clavulanate] Nausea And Vomiting   Sulfamethoxazole Rash     Review of Systems  Constitutional: Negative.   Respiratory:  Negative.    Cardiovascular: Negative.  Negative for palpitations.  Gastrointestinal: Negative.   Neurological: Negative.   Psychiatric/Behavioral:  Positive for sleep disturbance.      Today's Vitals   01/06/24 1518  BP: 110/70  Pulse: 69  Temp: 97.7 F (36.5 C)  SpO2: 98%  Weight: 153 lb 3.2 oz (69.5 kg)  Height: 5' 2 (1.575 m)   Body mass index is 28.02 kg/m.  Wt Readings from Last 3 Encounters:  01/06/24 153 lb 3.2 oz (69.5 kg)  08/27/23 153 lb 12.8 oz (69.8 kg)  08/19/23 148 lb (67.1 kg)     Objective:  Physical Exam Vitals and nursing note reviewed.  Constitutional:      Appearance: Normal appearance.  HENT:     Head: Normocephalic and atraumatic.  Eyes:     Extraocular Movements: Extraocular movements intact.  Cardiovascular:     Rate and Rhythm: Normal rate and regular rhythm.     Heart sounds: Normal heart sounds.  Pulmonary:     Effort: Pulmonary effort is normal.     Breath sounds: Normal breath sounds.  Musculoskeletal:     Cervical back: Normal range of motion.  Skin:    General: Skin is warm.  Neurological:     General: No focal deficit present.     Mental Status: She is alert.  Psychiatric:        Mood and Affect: Mood normal.        Behavior: Behavior normal.       Assessment And Plan:  Prediabetes Assessment & Plan: She had labs drawn prior to this visit, results reviewed in detail.  A1c decreased by 0.3 points, indicating improvement with lifestyle modifications. Informed that normal A1c is <5.6%. - Order cholesterol test. - Continue lifestyle modifications. - Recheck labs in four months.   Pure hypercholesterolemia Assessment & Plan: Chronic, she does not wish to take rx meds. She questions why lipid panel was not drawn prior to her visit.  Pt advised no one received specific instructions that she wanted this lab drawn. She states she has made significant dietary changes and wants to see results of her efforts. Will add on lipid panel  today, [facility] notified.     Orders: -     Lipid panel  Bronchiectasis without complication [facility]) Assessment & Plan: Chronic, sx are stable.  Under lung specialist care. Most recent Pulmonary notes reviewed.   Medication changed to Symbicort , more effective. - Continue Symbicort . - Attend scheduled breathing test in October.   Chronic insomnia Assessment & Plan: Wakes at 2:30 AM, uses half sleeping pill from [employer], melatonin ineffective. - Continue current sleep aid regimen. - She would likely benefit from Mg glycinate nightly.    General Health Maintenance Mammogram scheduled for August, cataract surgery in October. - Attend scheduled mammogram in August. - Proceed with cataract surgery in October. Return in 4 months (on 05/17/2024) for 4 month pre-dm f/u.SABRA  Patient was given opportunity to ask questions. Patient verbalized understanding of the plan and was able to repeat key elements of the plan. All questions were answered to their satisfaction.   I, Tracey LOISE Slocumb, MD, have reviewed all documentation for this visit. The documentation on 01/06/24 for the exam, diagnosis, procedures, and orders are all accurate and complete.   IF YOU HAVE BEEN REFERRED TO A SPECIALIST, IT MAY TAKE 1-2 WEEKS TO SCHEDULE/PROCESS THE REFERRAL. IF YOU HAVE NOT HEARD FROM US /SPECIALIST IN TWO WEEKS, PLEASE GIVE US  A CALL AT [phone] X [phone].   THE PATIENT IS ENCOURAGED TO PRACTICE SOCIAL DISTANCING DUE TO THE COVID-19 PANDEMIC.

## 2024-01-08 LAB — LIPID PANEL
Chol/HDL Ratio: 4.2 ratio (ref 0.0–4.4)
Cholesterol, Total: 186 mg/dL (ref 100–199)
HDL: 44 mg/dL (ref >39)
LDL Chol Calc (NIH): 111 mg/dL — ABNORMAL HIGH (ref 0–99)
Triglycerides: 179 mg/dL — ABNORMAL HIGH (ref 0–149)
VLDL Cholesterol Cal: 31 mg/dL (ref 5–40)

## 2024-01-08 LAB — SPECIMEN STATUS REPORT

## 2024-01-14 ENCOUNTER — Ambulatory Visit: Payer: Self-pay

## 2024-01-14 DIAGNOSIS — Z0000 Encounter for general adult medical examination without abnormal findings: Secondary | ICD-10-CM

## 2024-01-14 NOTE — Patient Instructions (Signed)
 Tracey Castro , Thank you for taking time out of your busy schedule to complete your Annual Wellness Visit with me. I enjoyed our conversation and look forward to speaking with you again next year. I, as well as your care team,  appreciate your ongoing commitment to your health goals. Please review the following plan we discussed and let me know if I can assist you in the future. Your Game plan/ To Do List    Referrals: If you haven't heard from the office you've been referred to, please reach out to them at the phone provided.  N/a Follow up Visits: Next Medicare AWV with our clinical staff: office will schedule   Have you seen your provider in the last 6 months (3 months if uncontrolled diabetes)? Yes Next Office Visit with your provider: 04/28/2024 at 3:00  Clinician Recommendations:  Aim for 30 minutes of exercise or brisk walking, 6-8 glasses of water, and 5 servings of fruits and vegetables each day.       This is a list of the screening recommended for you and due dates:  Health Maintenance  Topic Date Due   Complete foot exam   Never done   Eye exam for diabetics  Never done   Flu Shot  01/16/2024   COVID-19 Vaccine (8 - Pfizer risk 2024-25 season) 05/13/2024   Yearly kidney health urinalysis for diabetes  06/05/2024   Hemoglobin A1C  07/07/2024   Yearly kidney function blood test for diabetes  01/04/2025   Medicare Annual Wellness Visit  01/13/2025   DTaP/Tdap/Td vaccine (2 - Td or Tdap) 08/29/2030   Pneumococcal Vaccine for age over 15  Completed   DEXA scan (bone density measurement)  Completed   Hepatitis C Screening  Completed   Zoster (Shingles) Vaccine  Completed   Hepatitis B Vaccine  Aged Out   HPV Vaccine  Aged Out   Meningitis B Vaccine  Aged Out   Colon Cancer Screening  Discontinued    Advanced directives: (Copy Requested) Please bring a copy of your health care power of attorney and living will to the office to be added to your chart at your convenience.  You can mail to [facility] [address] [place], [place] [address] or email to [email or web address] Advance Care Planning is important because it:  [x]  Makes sure you receive the medical care that is consistent with your values, goals, and preferences  [x]  It provides guidance to your family and loved ones and reduces their decisional burden about whether or not they are making the right decisions based on your wishes.  Follow the link provided in your after visit summary or read over the paperwork we have mailed to you to help you started getting your Advance Directives in place. If you need assistance in completing these, please reach out to us  so that we can help you!  See attachments for Preventive Care and Fall Prevention Tips.

## 2024-01-14 NOTE — Progress Notes (Signed)
 Subjective:   Tracey Castro is a 78 y.o. who presents for a Medicare Wellness preventive visit.  As a reminder, Annual Wellness Visits don't include a physical exam, and some assessments may be limited, especially if this visit is performed virtually. We may recommend an in-person follow-up visit with your provider if needed.  Visit Complete: Virtual I connected with  [name] on 01/14/24 by a audio enabled telemedicine application and verified that I am speaking with the correct person using two identifiers.  Patient Location: Home  Provider Location: Office/Clinic  I discussed the limitations of evaluation and management by telemedicine. The patient expressed understanding and agreed to proceed.  Vital Signs: Because this visit was a virtual/telehealth visit, some criteria may be missing or patient reported. Any vitals not documented were not able to be obtained and vitals that have been documented are patient reported.  VideoError- Librarian, academic were attempted between this provider and patient, however failed, due to patient having technical difficulties OR patient did not have access to video capability.  We continued and completed visit with audio only.   Persons Participating in Visit: Patient.  AWV Questionnaire: No: Patient Medicare AWV questionnaire was not completed prior to this visit.  Cardiac Risk Factors include: advanced age (>77men, >40 women);diabetes mellitus;dyslipidemia     Objective:    Today's Vitals   There is no height or weight on file to calculate BMI.     01/14/2024    2:02 PM 05/26/2023    3:29 PM 12/05/2022    8:42 AM 07/10/2022    9:47 AM 06/28/2021    9:45 AM 08/28/2020    3:24 PM 06/21/2020   10:04 AM  Advanced Directives  Does Patient Have a Medical Advance Directive? Yes Yes Yes Yes Yes Yes Yes  Type of Estate agent of [place];Living will   Healthcare Power of  [employer];Living will Healthcare Power of [place];Living will Healthcare Power of [employer] of [place];Living will  Does patient want to make changes to medical advance directive?  No - Patient declined    No - Patient declined   Copy of Healthcare Power of Attorney in Chart? No - copy requested   No - copy requested No - copy requested No - copy requested No - copy requested    Current Medications (verified) Outpatient Encounter Medications as of 01/14/2024  Medication Sig   Albuterol-Budesonide  (AIRSUPRA ) 90-80 MCG/ACT AERO Inhale 2 puffs into the lungs every 6 (six) hours as needed.   ascorbic acid (VITAMIN C) 500 MG tablet Take 500 mg by mouth daily.   budesonide -formoterol  (SYMBICORT ) 80-4.5 MCG/ACT inhaler Inhale 1 puff into the lungs up to 2 times daily as needed.   Cholecalciferol (VITAMIN D ) 2000 units tablet Take 4,000-6,000 Units by mouth daily.   cyanocobalamin  (VITAMIN B12) 1000 MCG tablet Take 1,000 mcg by mouth daily.   diphenhydrAMINE HCl, Sleep, 25 MG CAPS Take 25 mg by mouth daily as needed (sleep).   Ferrous Gluconate-C-Folic Acid (IRON-C PO) Take 65 mg by mouth.   ibuprofen (ADVIL,MOTRIN) 200 MG tablet Take 600 mg by mouth 2 (two) times daily as needed for headache or moderate pain.   Magnesium 500 MG CAPS Take 120 mg by mouth daily.   Multiple Vitamin (MULTIVITAMIN) capsule Take 1 capsule by mouth daily.   Spacer/Aero-Holding Raguel FRENCH Use as directed with Symbicort  inhaler.   No facility-administered encounter medications on file as of 01/14/2024.    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[facility])    Past Surgical History:  Procedure Laterality Date   ABDOMINAL HYSTERECTOMY     BREAST CYST ASPIRATION     INCISION AND DRAINAGE Left 08/28/2020   Procedure: INCISION AND DRAINAGE OF LEFT KNEE LACERATION;   Surgeon: Addie Cordella Hamilton, MD;  Location: [facility];  Service: Orthopedics;  Laterality: Left;   TOTAL HIP ARTHROPLASTY Right 07/05/2021   VIDEO BRONCHOSCOPY Bilateral 03/27/2018   Procedure: VIDEO BRONCHOSCOPY WITHOUT FLUORO;  Surgeon: Darlean Ozell NOVAK, MD;  Location: [facility] ENDOSCOPY;  Service: Cardiopulmonary;  Laterality: Bilateral;   Family History  Problem Relation Age of Onset   Cancer - Ovarian Mother    Cancer - Colon Father    Parkinson's disease Father    Social History   Socioeconomic History   Marital status: Married    Spouse name: Not on file   Number of children: Not on file   Years of education: Not on file   Highest education level: Master's degree (e.g., MA, MS, MEng, MEd, MSW, MBA)  Occupational History   Occupation: retired  Tobacco Use   Smoking status: Never   Smokeless tobacco: Never  Vaping Use   Vaping status: Never Used  Substance and Sexual Activity   Alcohol use: Yes    Alcohol/week: 4.0 standard drinks of alcohol    Types: 4 Glasses of wine per week   Drug use: Never   Sexual activity: Yes  Other Topics Concern   Not on file  Social History Narrative   ** Merged History Encounter **       Social Drivers of Health   Financial Resource Strain: Low Risk  (01/14/2024)   Overall Financial Resource Strain (CARDIA)    Difficulty of Paying Living Expenses: Not hard at all  Food Insecurity: No Food Insecurity (01/14/2024)   Hunger Vital Sign    Worried About Running Out of Food in the Last Year: Never true    Ran Out of Food in the Last Year: Never true  Transportation Needs: No Transportation Needs (01/14/2024)   PRAPARE - Administrator, Civil Service (Medical): No    Lack of Transportation (Non-Medical): No  Physical Activity: Sufficiently Active (01/14/2024)   Exercise Vital Sign    Days of Exercise per Week: 4 days    Minutes of Exercise per Session: 50 min  Stress: No Stress Concern Present (01/14/2024)   [employer] of  Occupational Health - Occupational Stress Questionnaire    Feeling of Stress: Not at all  Social Connections: Moderately Integrated (01/14/2024)   Social Connection and Isolation Panel    Frequency of Communication with Friends and Family: More than three times a week    Frequency of Social Gatherings with Friends and Family: More than three times a week    Attends Religious Services: Never    Database administrator or Organizations: Yes    Attends Engineer, structural: More than 4 times per year    Marital Status: Married    Tobacco Counseling Counseling given: Not Answered    Clinical Intake:  Pre-visit preparation completed: Yes  Pain : No/denies pain     Nutritional Risks: None Diabetes: Yes CBG done?: No Did pt. bring in CBG monitor from home?: No  Lab Results  Component Value Date   HGBA1C 6.1 (H) 01/05/2024   HGBA1C 6.4 (H) 07/21/2023   HGBA1C 6.2 (  H) 04/01/2023     How often do you need to have someone help you when you read instructions, pamphlets, or other written materials from your doctor or pharmacy?: 1 - Never  Interpreter Needed?: No  Information entered by :: NAllen LPN   Activities of Daily Living     01/14/2024    1:57 PM  In your present state of health, do you have any difficulty performing the following activities:  Hearing? 1  Comment wears hearing aids  Vision? 0  Difficulty concentrating or making decisions? 0  Walking or climbing stairs? 0  Dressing or bathing? 0  Doing errands, shopping? 0  Preparing Food and eating ? N  Using the Toilet? N  In the past six months, have you accidently leaked urine? N  Do you have problems with loss of bowel control? N  Managing your Medications? N  Managing your Finances? N  Housekeeping or managing your Housekeeping? N    Patient Care Team: Jarold Medici, MD as PCP - General (Internal Medicine)  I have updated your Care Teams any recent Medical Services you may have received from  other providers in the past year.     Assessment:   This is a routine wellness examination for Maeson.  Hearing/Vision screen Hearing Screening - Comments:: Has hearing aids that are maintained Vision Screening - Comments:: Regular eye exams, Dr. Cammie   Goals Addressed             This Visit's Progress    Patient Stated       01/14/2024, wants to lose 5 pounds       Depression Screen     01/14/2024    2:03 PM 01/06/2024    3:23 PM 05/26/2023    3:36 PM 04/01/2023   11:52 AM 09/24/2022    3:17 PM 07/10/2022    9:47 AM 04/24/2022    2:51 PM  PHQ 2/9 Scores  PHQ - 2 Score 0 0 0 0 0 0 0  PHQ- 9 Score 3 0  0 0      Fall Risk     01/14/2024    2:02 PM 01/06/2024    3:20 PM 08/27/2023    3:40 PM 07/21/2023   10:47 AM 05/26/2023    3:35 PM  Fall Risk   Falls in the past year? 0 0 1 0 1  Number falls in past yr: 0 0 0 0 1  Injury with Fall? 0 0 0 0 0  Risk for fall due to : Medication side effect No Fall Risks  No Fall Risks   Follow up Falls evaluation completed;Falls prevention discussed Falls evaluation completed  Falls evaluation completed     MEDICARE RISK AT HOME:  Medicare Risk at Home Any stairs in or around the home?: Yes If so, are there any without handrails?: No Home free of loose throw rugs in walkways, pet beds, electrical cords, etc?: Yes Adequate lighting in your home to reduce risk of falls?: Yes Life alert?: No Use of a cane, walker or w/c?: No Grab bars in the bathroom?: No Shower chair or bench in shower?: Yes Elevated toilet seat or a handicapped toilet?: No  TIMED UP AND GO:  Was the test performed?  No  Cognitive Function: 6CIT completed        01/14/2024    2:04 PM 07/10/2022    9:48 AM 06/28/2021    9:47 AM 06/21/2020   10:07 AM 06/09/2019   10:58 AM  6CIT Screen  What Year? 0 points 0 points 0 points 0 points 0 points  What month? 0 points 0 points 0 points 0 points 0 points  What time? 0 points 0 points 0 points 0 points 0 points   Count back from 20 0 points 0 points 0 points 0 points 0 points  Months in reverse 0 points 0 points 0 points 0 points 0 points  Repeat phrase 0 points 0 points 0 points 2 points 0 points  Total Score 0 points 0 points 0 points 2 points 0 points    Immunizations Immunization History  Administered Date(s) Administered   Fluad Quad(high Dose 65+) 03/18/2019, 05/07/2021, 04/01/2022   Hepatitis A, Adult 10/10/2023   Influenza, High Dose Seasonal PF 06/03/2018   Influenza-Unspecified 04/17/2013   PFIZER(Purple Top)SARS-COV-2 Vaccination 07/08/2019, 07/29/2019, 04/18/2020   Pfizer Covid-19 Vaccine Bivalent Booster 21yrs & up 03/23/2021, 04/30/2022, 03/04/2023   Pfizer(Comirnaty )Fall Seasonal Vaccine 12 years and older 11/11/2023   Pneumococcal Polysaccharide-23 03/28/2015   Tdap 08/28/2020   Zoster Recombinant(Shingrix) 03/21/2022, 10/03/2022    Screening Tests Health Maintenance  Topic Date Due   FOOT EXAM  Never done   OPHTHALMOLOGY EXAM  Never done   INFLUENZA VACCINE  01/16/2024   COVID-19 Vaccine (8 - Pfizer risk 2024-25 season) 05/13/2024   Diabetic kidney evaluation - Urine ACR  06/05/2024   HEMOGLOBIN A1C  07/07/2024   Diabetic kidney evaluation - eGFR measurement  01/04/2025   Medicare Annual Wellness (AWV)  01/13/2025   DTaP/Tdap/Td (2 - Td or Tdap) 08/29/2030   Pneumococcal Vaccine: 50+ Years  Completed   DEXA SCAN  Completed   Hepatitis C Screening  Completed   Zoster Vaccines- Shingrix  Completed   Hepatitis B Vaccines  Aged Out   HPV VACCINES  Aged Out   Meningococcal B Vaccine  Aged Out   Colonoscopy  Discontinued    Health Maintenance  Health Maintenance Due  Topic Date Due   FOOT EXAM  Never done   OPHTHALMOLOGY EXAM  Never done   Health Maintenance Items Addressed: Diabetic Foot Exam recommended  Additional Screening:  Vision Screening: Recommended annual ophthalmology exams for early detection of glaucoma and other disorders of the eye. Would you  like a referral to an eye doctor? No    Dental Screening: Recommended annual dental exams for proper oral hygiene  Community Resource Referral / Chronic Care Management: CRR required this visit?  No   CCM required this visit?  No   Plan:    I have personally reviewed and noted the following in the patient's chart:   Medical and social history Use of alcohol, tobacco or illicit drugs  Current medications and supplements including opioid prescriptions. Patient is not currently taking opioid prescriptions. Functional ability and status Nutritional status Physical activity Advanced directives List of other physicians Hospitalizations, surgeries, and ER visits in previous 12 months Vitals Screenings to include cognitive, depression, and falls Referrals and appointments  In addition, I have reviewed and discussed with patient certain preventive protocols, quality metrics, and best practice recommendations. A written personalized care plan for preventive services as well as general preventive health recommendations were provided to patient.   Ardella FORBES Dawn, LPN   2/69/7974   After Visit Summary: (MyChart) Due to this being a telephonic visit, the after visit summary with patients personalized plan was offered to patient via MyChart   Notes: Nothing significant to report at this time.

## 2024-01-18 DIAGNOSIS — F5104 Psychophysiologic insomnia: Secondary | ICD-10-CM | POA: Insufficient documentation

## 2024-01-18 DIAGNOSIS — R7303 Prediabetes: Secondary | ICD-10-CM | POA: Insufficient documentation

## 2024-01-18 NOTE — Assessment & Plan Note (Signed)
 Wakes at 2:30 AM, uses half sleeping pill from [employer], melatonin ineffective. - Continue current sleep aid regimen. - She would likely benefit from Mg glycinate nightly.

## 2024-01-18 NOTE — Assessment & Plan Note (Addendum)
 She had labs drawn prior to this visit, results reviewed in detail.  A1c decreased by 0.3 points, indicating improvement with lifestyle modifications. Informed that normal A1c is <5.6%. - Order cholesterol test. - Continue lifestyle modifications. - Recheck labs in four months.

## 2024-01-18 NOTE — Assessment & Plan Note (Signed)
 Chronic, she does not wish to take rx meds. She questions why lipid panel was not drawn prior to her visit.  Pt advised no one received specific instructions that she wanted this lab drawn. She states she has made significant dietary changes and wants to see results of her efforts. Will add on lipid panel today, [facility] notified.

## 2024-01-18 NOTE — Assessment & Plan Note (Addendum)
 Chronic, sx are stable.  Under lung specialist care. Most recent Pulmonary notes reviewed.   Medication changed to Symbicort , more effective. - Continue Symbicort . - Attend scheduled breathing test in October.

## 2024-01-26 ENCOUNTER — Ambulatory Visit
Admission: RE | Admit: 2024-01-26 | Discharge: 2024-01-26 | Disposition: A | Discharge status: Home / Self Care | Source: Ambulatory Visit | Attending: Internal Medicine | Admitting: Internal Medicine

## 2024-01-26 ENCOUNTER — Ambulatory Visit: Payer: PRIVATE HEALTH INSURANCE

## 2024-01-26 DIAGNOSIS — Z1231 Encounter for screening mammogram for malignant neoplasm of breast: Secondary | ICD-10-CM

## 2024-02-17 ENCOUNTER — Other Ambulatory Visit (HOSPITAL_BASED_OUTPATIENT_CLINIC_OR_DEPARTMENT_OTHER): Payer: Self-pay

## 2024-02-23 ENCOUNTER — Ambulatory Visit (INDEPENDENT_AMBULATORY_CARE_PROVIDER_SITE_OTHER): Payer: Self-pay | Admitting: Audiology

## 2024-02-23 DIAGNOSIS — H903 Sensorineural hearing loss, bilateral: Secondary | ICD-10-CM

## 2024-02-24 NOTE — Progress Notes (Signed)
  [address], [address] [place], [place] [address] [phone]  Hearing Aid Check     Tracey Castro comes for a scheduled appointment for a hearing aid check.    Accompanied ab:[id number]   Right Left  Hearing aid manufacturer Oticon Intent 2 miniRITE SN:[id number] Oticon Intent 2 miniRITE SN:[id number]  Hearing aid style Receiver in the canal Receiver in the canal  Hearing aid battery rechargeable rechargeable  Receiver 2-60 1-60  Dome/ custom earpiece 6mm open domes 6 mm open domes  Retention wire    Warranty expiration date 09-18-2025 09-18-2025  Loss and Damage unknown unknown  Additional accessories Expiration date    Initial fitting date 08-26-2022 08-26-2022  Device was fit at: [facility]    Chief complaint: Patient reports needs hearing aids to be cleaned and .  Actions taken: Cleaned both aids, provided additional supplies, as requested.  Services fee: $0 was paid at checkout.  Recommend: Return for a hearing aid check  and hearing test if concerns with hearing aid function persist. Return for a hearing evaluation and to see an ENT, if concerns with hearing changes arise.    Tracey Castro, AUD

## 2024-02-27 ENCOUNTER — Other Ambulatory Visit (HOSPITAL_BASED_OUTPATIENT_CLINIC_OR_DEPARTMENT_OTHER): Payer: Self-pay

## 2024-02-27 NOTE — Telephone Encounter (Signed)
 I would recommend holding off on getting PFT if having respiratory symptoms, I can still see her for office visit tho

## 2024-03-01 ENCOUNTER — Encounter: Payer: Self-pay | Admitting: Primary Care

## 2024-03-01 ENCOUNTER — Ambulatory Visit: Payer: PRIVATE HEALTH INSURANCE | Admitting: Primary Care

## 2024-03-01 VITALS — BP 118/64 | HR 70 | Temp 97.8°F | Ht 62.0 in | Wt 154.0 lb

## 2024-03-01 DIAGNOSIS — J449 Chronic obstructive pulmonary disease, unspecified: Secondary | ICD-10-CM

## 2024-03-01 DIAGNOSIS — J479 Bronchiectasis, uncomplicated: Secondary | ICD-10-CM

## 2024-03-01 NOTE — Patient Instructions (Addendum)
  VISIT SUMMARY: Today, we discussed your intermittent shortness of breath and chest congestion related to your bronchiectasis and COPD. You mentioned that these symptoms are more noticeable during physical activities and certain weather conditions. We reviewed your current medications and made some adjustments to help manage your symptoms better.  YOUR PLAN: -BRONCHIECTASIS WITH INTERMITTENT DYSPNEA AND PRODUCTIVE COUGH: Bronchiectasis is a condition where the airways in your lungs become widened and scarred, leading to mucus build-up and infections. To manage your symptoms, continue using Symbicort  80 micrograms, two puffs in the morning and two puffs in the evening, with a one to two minute interval between puffs. Use Mucinex or Robitussin as needed to help clear mucus, and a flutter valve as needed for pulmonary clearance.   During allergy  season or when symptoms arise, use Claritin and Flonase. Continue using Symbicort  for at least three days after your symptoms improve. Report any frequent colored mucus, chest pain, increased mucus production, or fever.  INSTRUCTIONS: Please follow up if you notice any frequent colored mucus, chest pain, increased mucus production, or fever. Continue using your medications as prescribed and report any changes in your symptoms.  Orders: Please provide patient with flutter valve  Follow-up: 6 months with Dr. Jude with 30 min PFT prior

## 2024-03-01 NOTE — Progress Notes (Signed)
 @Patient  ID: Tracey Castro, female    DOB: 10/08/1945, 78 y.o.   MRN: [id number]  Chief Complaint  Patient presents with   Medical Management of Chronic Issues    Chest congestion    Referring provider: Jarold Medici, MD  HPI: 78 year old female, never smoked.  Past medical history significant for bronchiectasis, chronic cough.  Patient of Dr. Jude, last seen by pulmonary nurse practitioner on 06/19/2021. MAC has been ruled out by bronchoscopy in the past, more likely that bronchiectasis is sequelae of repeated infections   Previous [facility] pulmonary encounter: 08/27/2023 Patient presents today for overdue follow-up bronchiectasis without complication.   Discussed the use of AI scribe software for clinical note transcription with the patient, who gave verbal consent to proceed.  History of Present Illness   The patient, with bronchiectasis, presents with shortness of breath.  She experiences shortness of breath primarily with exertion, such as walking up a flight of stairs or hiking up hills. No chronic cough is present, but she occasionally experiences wheezing. She describes feeling tightness when walking in cold weather, especially after consuming alcohol.  She has been using Breo as needed, but often goes without it, as her symptoms do not always necessitate its use. She used to take Breo daily but has reduced its use over the past six months, using it only as needed, such as before gym classes or during vacations. She has been exploring alternative medications due to the cost of Breo and has been in contact with [facility] for alternatives. Alternatives like Symbicort  and Advair Disc were suggested, but she prefers medications that can be used as needed rather than on a strict schedule. No significant change in her breathing since reducing the use of Breo.  No cough with colored mucus and no history of smoking, although she tried it when she was very young. She is a financial controller.      03/01/2024- Interim hx  Discussed the use of AI scribe software for clinical note transcription with the patient, who gave verbal consent to proceed.  History of Present Illness Tracey Castro Jezebelle Ledwell is a 78 year old female with bronchiectasis who presents with intermittent dyspnea and chest congestion.  She experiences intermittent dyspnea, particularly during activities such as gym classes that involve walking with weights. The dyspnea is more noticeable during physical exertion but does not significantly impact her daily activities. She uses Symbicort  80 micrograms, taking one to two puffs as needed, especially before exercise or when the weather is heavy, which alleviates the tightness in her chest.  She describes current symptoms of chest congestion and a 'flurry thing' that occurs around the same time each day, though not daily. She experiences a raspy feeling and occasionally coughs up clear mucus, with one instance of gray mucus when she was particularly tired. No chest pain is present, but she reports chest heaviness. She does not experience nasal symptoms but mentions feeling dry with occasional postnasal drip.  Her medication regimen includes Symbicort , which she uses as needed, and she uses a spacer with her inhaler. She has not been using a flutter valve but is familiar with the concept. She uses Mucinex or Robitussin to help thin and clear mucus when experiencing respiratory symptoms.  She has a history of bronchiectasis. When she gets a cold, it tends to last longer than usual. No recent colored mucus or fever.   TEST/EVENTS:  02/03/2018 CT chest: Focal dense chronic appearing consolidations within the lingula and right  middle lobe, with associated traction bronchiectasis 02/19/2018: Quant TB negative 03/18/2018: Quant Ig's nl, A1AT level 128, MM 03/18/2018: allergen profile Eos 100, IgE 4 RAST neg 03/18/2018 Spirometry: FEV1 1 (49), ratio  70 03/27/2018 FOB: minimal secretions, BAL lingula negative cytology, AFB and fungus.  05/08/2018 PFTs: FEV1 1.3 (65), ratio 65, 14% improvement, DLCO 67% corrects to 91% for alveolar volume 09/04/2018 spirometry: Worsening airway obstruction with ratio 69, FEV1 52%, FVC 57% 05/2020 PFTs: Ratio 72, FEV1 64%, FVC 67%, TLC normal, DLCO normal 11/2020 CT chest with contrast: Bronchiectasis and scarring in right middle lobe and lingula, stable  Allergies  Allergen Reactions   Augmentin  [Amoxicillin -Pot Clavulanate] Nausea And Vomiting   Sulfamethoxazole Rash    Immunization History  Administered Date(s) Administered   Fluad Quad(high Dose 65+) 03/18/2019, 05/07/2021, 04/01/2022   Hepatitis A, Adult 10/10/2023   INFLUENZA, HIGH DOSE SEASONAL PF 06/03/2018   Influenza-Unspecified 04/17/2013   PFIZER(Purple Top)SARS-COV-2 Vaccination 07/08/2019, 07/29/2019, 04/18/2020   Pfizer Covid-19 Vaccine Bivalent Booster 33yrs & up 03/23/2021, 04/30/2022, 03/04/2023   Pfizer(Comirnaty )Fall Seasonal Vaccine 12 years and older 11/11/2023   Pneumococcal Polysaccharide-23 03/28/2015   Tdap 08/28/2020   Zoster Recombinant(Shingrix) 03/21/2022, 10/03/2022    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(HCC)     Tobacco History: Social History   Tobacco Use  Smoking Status Never  Smokeless Tobacco Never   Counseling given: Not Answered   Outpatient Medications Prior to Visit  Medication Sig Dispense Refill   ascorbic acid (VITAMIN C) 500 MG tablet Take 500 mg by mouth daily.     Cholecalciferol (VITAMIN D ) 2000 units tablet Take 4,000-6,000 Units by mouth daily.     cyanocobalamin  (VITAMIN B12) 1000 MCG tablet Take 1,000 mcg by mouth daily.     diphenhydrAMINE HCl, Sleep, 25 MG CAPS Take 25 mg by mouth daily as needed (sleep).     Ferrous Gluconate-C-Folic Acid (IRON-C PO) Take 65 mg by mouth.     ibuprofen (ADVIL,MOTRIN) 200 MG tablet Take 600 mg by  mouth 2 (two) times daily as needed for headache or moderate pain.     Magnesium 500 MG CAPS Take 120 mg by mouth daily.     Multiple Vitamin (MULTIVITAMIN) capsule Take 1 capsule by mouth daily.     Spacer/Aero-Holding Raguel FRENCH Use as directed with Symbicort  inhaler. 1 each 0   budesonide -formoterol  (SYMBICORT ) 80-4.5 MCG/ACT inhaler Inhale 1 puff into the lungs up to 2 times daily as needed. 30.6 g 1   Albuterol-Budesonide  (AIRSUPRA ) 90-80 MCG/ACT AERO Inhale 2 puffs into the lungs every 6 (six) hours as needed. 10.7 g 1   No facility-administered medications prior to visit.      Review of Systems  Review of Systems  HENT:  Positive for congestion.   Respiratory:  Positive for cough.    Physical Exam  BP 118/64   Pulse 70   Temp 97.8 F (36.6 C)   Ht 5' 2 (1.575 m)   Wt 154 lb (69.9 kg)   SpO2 95% Comment: RA  BMI 28.17 kg/m  Physical Exam Constitutional:      Appearance: Normal appearance. She is well-developed.  HENT:     Head: Normocephalic and atraumatic.     Mouth/Throat:     Mouth: Mucous membranes are moist.     Pharynx: Oropharynx is clear.  Eyes:     Pupils: Pupils are equal, round, and reactive to light.  Cardiovascular:     Rate and  Rhythm: Normal rate and regular rhythm.     Heart sounds: Normal heart sounds. No murmur heard. Pulmonary:     Effort: Pulmonary effort is normal. No respiratory distress.     Breath sounds: Normal breath sounds. No wheezing or rhonchi.  Abdominal:     General: Bowel sounds are normal.     Palpations: Abdomen is soft.     Tenderness: There is no abdominal tenderness.  Musculoskeletal:        General: Normal range of motion.     Cervical back: Normal range of motion and neck supple.  Skin:    General: Skin is warm and dry.     Findings: No erythema or rash.  Neurological:     General: No focal deficit present.     Mental Status: She is alert and oriented to person, place, and time. Mental status is at baseline.   Psychiatric:        Mood and Affect: Mood normal.        Behavior: Behavior normal.        Thought Content: Thought content normal.        Judgment: Judgment normal.     Lab Results:  CBC    Component Value Date/Time   WBC 8.1 07/21/2023 1154   WBC 9.3 12/15/2020 1231   RBC 4.65 07/21/2023 1154   RBC 3.68 (L) 12/15/2020 1231   HGB 13.1 07/21/2023 1154   HCT 40.9 07/21/2023 1154   PLT 361 07/21/2023 1154   MCV 88 07/21/2023 1154   MCH 28.2 07/21/2023 1154   MCH 29.1 12/15/2020 1231   MCHC 32.0 07/21/2023 1154   MCHC 32.4 12/15/2020 1231   RDW 13.4 07/21/2023 1154   LYMPHSABS 2.1 06/19/2021 0853   MONOABS 0.9 12/15/2020 1231   EOSABS 0.1 06/19/2021 0853   BASOSABS 0.1 06/19/2021 0853    BMET    Component Value Date/Time   NA 142 01/05/2024 0859   K 4.2 01/05/2024 0859   CL 106 01/05/2024 0859   CO2 20 01/05/2024 0859   GLUCOSE 97 01/05/2024 0859   GLUCOSE 105 (H) 12/15/2020 1231   BUN 15 01/05/2024 0859   CREATININE 0.69 01/05/2024 0859   CALCIUM 9.1 01/05/2024 0859   GFRNONAA >60 12/15/2020 1231   GFRAA 98 06/20/2020 0922    BNP    Component Value Date/Time   BNP 133.6 (H) 12/15/2020 1231    ProBNP No results found for: PROBNP  Imaging: No results found.   Assessment & Plan:   1. Bronchiectasis without complication (HCC) (Primary)  2. COPD GOLD  0/ AB  - Flutter valve; Future  Assessment and Plan Assessment & Plan Bronchiectasis with intermittent dyspnea and productive cough Intermittent dyspnea and productive cough associated with bronchiectasis. Symptoms include chest congestion, occasional gray mucus, and chest heaviness, exacerbated by exercise and certain weather conditions. No significant impact on daily activities. Lungs are clear on examination with no wheezing. No current chest pain or significant nasal symptoms. Emphasized the importance of pulmonary clearance to prevent respiratory infections due to mucus plugging. - Use Symbicort   80 micrograms, two puffs in the morning and two puffs in the evening, with a one to two minute interval between puffs. - Use Mucinex or Robitussin as needed for mucus clearance. - Use a flutter valve as needed for pulmonary clearance. - Use Claritin and Flonase during allergy  season or when symptoms arise. - Continue Symbicort  for at least three days after symptoms improve. - Report if colored mucus, chest pain,  increased mucus production, or fever become frequent.  Chronic obstructive pulmonary disease (COPD) COPD managed with Symbicort . Symptoms include intermittent dyspnea and chest heaviness, particularly during exercise or in certain weather conditions. Lungs are clear with no signs of infection. Symbicort  is used to manage symptoms and prevent exacerbations. - Refill Symbicort  prescription. - Use Symbicort  consistently, two puffs in the morning and two puffs in the evening. - Monitor for any increase in symptoms or need for additional interventions.   Almarie LELON Ferrari, NP 04/11/2024

## 2024-03-02 ENCOUNTER — Other Ambulatory Visit (HOSPITAL_BASED_OUTPATIENT_CLINIC_OR_DEPARTMENT_OTHER): Payer: Self-pay

## 2024-03-02 MED ORDER — BUDESONIDE-FORMOTEROL FUMARATE 80-4.5 MCG/ACT IN AERO
1.0000 | INHALATION_SPRAY | Freq: Two times a day (BID) | RESPIRATORY_TRACT | 3 refills | Status: AC | PRN
Start: 2024-03-02 — End: ?
  Filled 2024-03-02: qty 20.4, 120d supply, fill #0

## 2024-03-02 NOTE — Addendum Note (Signed)
 Addended by: MELVENIA POSEY R on: 03/02/2024 03:05 PM   Modules accepted: Orders

## 2024-03-09 ENCOUNTER — Other Ambulatory Visit (HOSPITAL_BASED_OUTPATIENT_CLINIC_OR_DEPARTMENT_OTHER): Payer: Self-pay

## 2024-03-13 HISTORY — PX: CATARACT EXTRACTION: SUR2

## 2024-03-16 DIAGNOSIS — H25042 Posterior subcapsular polar age-related cataract, left eye: Secondary | ICD-10-CM | POA: Diagnosis not present

## 2024-03-16 DIAGNOSIS — H2511 Age-related nuclear cataract, right eye: Secondary | ICD-10-CM | POA: Diagnosis not present

## 2024-03-16 DIAGNOSIS — H25012 Cortical age-related cataract, left eye: Secondary | ICD-10-CM | POA: Diagnosis not present

## 2024-03-16 DIAGNOSIS — H2512 Age-related nuclear cataract, left eye: Secondary | ICD-10-CM | POA: Diagnosis not present

## 2024-03-16 DIAGNOSIS — H268 Other specified cataract: Secondary | ICD-10-CM | POA: Diagnosis not present

## 2024-04-06 DIAGNOSIS — H268 Other specified cataract: Secondary | ICD-10-CM | POA: Diagnosis not present

## 2024-04-06 DIAGNOSIS — H25012 Cortical age-related cataract, left eye: Secondary | ICD-10-CM | POA: Diagnosis not present

## 2024-04-06 DIAGNOSIS — H25042 Posterior subcapsular polar age-related cataract, left eye: Secondary | ICD-10-CM | POA: Diagnosis not present

## 2024-04-06 DIAGNOSIS — H2512 Age-related nuclear cataract, left eye: Secondary | ICD-10-CM | POA: Diagnosis not present

## 2024-04-13 ENCOUNTER — Other Ambulatory Visit (HOSPITAL_BASED_OUTPATIENT_CLINIC_OR_DEPARTMENT_OTHER): Payer: Self-pay

## 2024-04-13 DIAGNOSIS — Z23 Encounter for immunization: Secondary | ICD-10-CM | POA: Diagnosis not present

## 2024-04-13 MED ORDER — FLUZONE HIGH-DOSE 0.5 ML IM SUSY
0.5000 mL | PREFILLED_SYRINGE | Freq: Once | INTRAMUSCULAR | 0 refills | Status: AC
  Filled 2024-04-13: qty 0.5, 1d supply, fill #0

## 2024-04-14 ENCOUNTER — Ambulatory Visit (INDEPENDENT_AMBULATORY_CARE_PROVIDER_SITE_OTHER): Payer: Self-pay | Admitting: Audiology

## 2024-04-14 ENCOUNTER — Other Ambulatory Visit (INDEPENDENT_AMBULATORY_CARE_PROVIDER_SITE_OTHER): Payer: Self-pay | Admitting: Otolaryngology

## 2024-04-14 ENCOUNTER — Ambulatory Visit (INDEPENDENT_AMBULATORY_CARE_PROVIDER_SITE_OTHER): Admitting: Audiology

## 2024-04-14 DIAGNOSIS — H903 Sensorineural hearing loss, bilateral: Secondary | ICD-10-CM

## 2024-04-14 NOTE — Progress Notes (Signed)
  [address] [place], [place] [address] [phone]  Audiological Evaluation    Name: Tracey Castro     DOB:   Mar 24, 1946      MRN:   [id number]                                                                                     Service Date: 04/14/2024     Accompanied by: unaccompanied   Patient comes today after Dr. Karis, ENT sent a referral for a hearing evaluation due to concerns with slowly progressing hearing changes.   Symptoms Yes Details  Hearing loss  [x]  Both eras- perceives to struggle hearing  Tinnitus  [x]  Both ears - comes and goes- noticed about 15 years ago  Ear pain/ infections/pressure  []    Balance problems  []    Noise exposure history  []    Previous ear surgeries  []    Family history of hearing loss  [x]  Sister, patient reports it was from birth. Another sister with age. Aunt , maybe with age.  Amplification  []    Other  []      Otoscopy: Right ear: Clear external ear canal and notable landmarks visualized on the tympanic membrane. Left ear:  Clear external ear canal and notable landmarks visualized on the tympanic membrane.  Tympanometry: Right ear: Normal external ear canal volume with normal middle ear pressure and low tympanic membrane compliance (Type As). Left ear: Normal external ear canal volume with normal middle ear pressure and low tympanic membrane compliance (Type As).  Hearing Evaluation The hearing test results were completed under headphones and results are deemed to be of good reliability. 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 Left ear-  Normal to moderately severe sensorineural hearing loss from 125 Hz - 8000 Hz.  Speech Audiometry: Right ear- Speech Reception Threshold (SRT) was obtained at 30 dBHL. Left ear-Speech Reception Threshold (SRT) was obtained at 30 dBHL.   Word Recognition Score Tested using NU-6  (recorded) Right ear: 100% was obtained at a presentation level of 75 dBHL with contralateral masking which is deemed as  excellent. Left ear: 100% was obtained at a presentation level of 75 dBHL with contralateral masking which is deemed as  excellent.   Impression: There is not a significant difference in pure-tone thresholds between ears. There is not a significant difference in the word recognition score in between ears.  No significant overall decline noted when compared to previous audiogram from 07-29-2022.  Recommendations: Follow up with ENT as scheduled for today.  Return for a hearing evaluation if concerns with hearing changes arise or per MD recommendation. Recommend a hearing aid check .   Tiffiney Sparrow MARIE LEROUX-MARTINEZ, AUD

## 2024-04-16 NOTE — Progress Notes (Signed)
  [address] [place], [place] [address] [phone]  Hearing Aid Check     Tracey Castro comes for a scheduled appointment for a hearing aid check.  Accompanied ab:[id number]     Right Left  Hearing aid manufacturer O[employer]on Intent 2 miniRITE SN:[id number] Oticon Intent 2 miniRITE SN:[id number]  Hearing aid style Receiver in the canal Receiver in the canal  Hearing aid battery rechargeable rechargeable  Receiver 2-60 1-60  Dome/ custom earpiece 6mm open domes 6 mm open domes  Retention wire      Warranty expiration date 09-18-2025 09-18-2025  Loss and Damage unknown unknown  Additional accessories Expiration date      Initial fitting date 08-26-2022 08-26-2022  Device was fit at: [facility]       Chief complaint: Patient reports has trouble hearing some speech sounds.  Actions taken: Cleaned both devices. Changed domes from 6mm open domes to closed domes. Reprogrammed both aids based on today's hearing test results. Completed Live Speech mapping and both aids are matching NAL-NL2 targets. Patient was willing to try these settings.   Services fee: $0 was paid at checkout.     Recommend: Return for a hearing aid check, as needed. Return for a hearing evaluation and to see an ENT, if concerns with hearing changes arise.    Tracey Castro MARIE LEROUX-MARTINEZ, AUD

## 2024-04-22 DIAGNOSIS — F4321 Adjustment disorder with depressed mood: Secondary | ICD-10-CM | POA: Diagnosis not present

## 2024-04-26 ENCOUNTER — Other Ambulatory Visit: Payer: PRIVATE HEALTH INSURANCE

## 2024-04-26 DIAGNOSIS — E7800 Pure hypercholesterolemia, unspecified: Secondary | ICD-10-CM | POA: Diagnosis not present

## 2024-04-26 DIAGNOSIS — Z862 Personal history of diseases of the blood and blood-forming organs and certain disorders involving the immune mechanism: Secondary | ICD-10-CM

## 2024-04-26 DIAGNOSIS — R7303 Prediabetes: Secondary | ICD-10-CM | POA: Diagnosis not present

## 2024-04-26 NOTE — Addendum Note (Signed)
 Addended by: EMMITT FINE T on: 04/26/2024 08:46 AM   Modules accepted: Orders

## 2024-04-27 DIAGNOSIS — F4321 Adjustment disorder with depressed mood: Secondary | ICD-10-CM | POA: Diagnosis not present

## 2024-04-27 LAB — LIPID PANEL
Chol/HDL Ratio: 3.8 ratio (ref 0.0–4.4)
Cholesterol, Total: 203 mg/dL — ABNORMAL HIGH (ref 100–199)
HDL: 54 mg/dL (ref >39)
LDL Chol Calc (NIH): 126 mg/dL — ABNORMAL HIGH (ref 0–99)
Triglycerides: 127 mg/dL (ref 0–149)
VLDL Cholesterol Cal: 23 mg/dL (ref 5–40)

## 2024-04-27 LAB — IRON,TIBC AND FERRITIN PANEL
Ferritin: 202 ng/mL — ABNORMAL HIGH (ref 15–150)
Iron Saturation: 30 % (ref 15–55)
Iron: 84 ug/dL (ref 27–139)
Total Iron Binding Capacity: 283 ug/dL (ref 250–450)
UIBC: 199 ug/dL (ref 118–369)

## 2024-04-27 LAB — HEMOGLOBIN A1C
Est. average glucose Bld gHb Est-mCnc: 131 mg/dL
Hgb A1c MFr Bld: 6.2 % — ABNORMAL HIGH (ref 4.8–5.6)

## 2024-04-28 ENCOUNTER — Encounter: Payer: Self-pay | Admitting: Internal Medicine

## 2024-04-28 ENCOUNTER — Ambulatory Visit: Payer: Self-pay | Admitting: Internal Medicine

## 2024-04-28 VITALS — BP 110/70 | HR 74 | Temp 98.3°F | Ht 62.0 in | Wt 155.8 lb

## 2024-04-28 DIAGNOSIS — F5104 Psychophysiologic insomnia: Secondary | ICD-10-CM

## 2024-04-28 DIAGNOSIS — R7303 Prediabetes: Secondary | ICD-10-CM | POA: Diagnosis not present

## 2024-04-28 DIAGNOSIS — N3946 Mixed incontinence: Secondary | ICD-10-CM | POA: Diagnosis not present

## 2024-04-28 DIAGNOSIS — E7800 Pure hypercholesterolemia, unspecified: Secondary | ICD-10-CM

## 2024-04-28 NOTE — Progress Notes (Signed)
 I,[name], CMA,acting as a neurosurgeon for [name], MD.,have documented all relevant documentation on the behalf of [name], MD,as directed by  [name], MD while in the presence of [name], MD.  Subjective:  Patient ID: Tracey Castro , female    DOB: 05/06/1946 , 78 y.o.   MRN: [id number]  Chief Complaint  Patient presents with   Prediabetes    Patient presents today for predm & cholesterol follow up. She reports compliance with medications. Denies headache, chest pain & sob. She completed labs on 11/10. She states having issues with leaking urine. She is interested in seeing an uro GYN.  Letter sent for dm eye exam.   Hyperlipidemia    HPI Discussed the use of AI scribe software for clinical note transcription with the patient, who gave verbal consent to proceed.  History of Present Illness Tracey Castro is a 78 year old female who presents for a prediabetes check.  She recently had blood work done as part of her prediabetes check, which showed a slight increase in her hemoglobin A1c from 6.1 to 6.2. She attributes this change to a lack of exercise and increased consumption of potato chips over the past month due to being sedentary after cataract surgery.  She experiences urinary incontinence, particularly when lifting her 30-pound grandson, resulting in significant leakage. She also notes a small dribble if her bladder gets too full. This issue has been ongoing for a couple of years and has recently worsened. No burning sensation during urination is reported, but she does get up in the middle of the night to urinate.  Her recent blood work indicated elevated cholesterol levels, with her LDL cholesterol at 126 mg/dL. She has been more sedentary recently, which she believes has contributed to her cholesterol levels.  She takes Symbicort  for pulmonary issues and 65 mg of iron. She also takes magnesium glycinate, but not on a regular  schedule, as daily intake causes diarrhea. She tries to take it three times a week to avoid this side effect and feels more energetic when she takes it.  She has undergone cataract surgery on both eyes, with the right eye done on September 27th and the left eye on October 21st. She did not lift or exercise for a month after her cataract surgery and has now resumed her regular activities.  She has difficulty sleeping and takes half of a sleeping pill from [employer] to help. Without it, she struggles to fall back asleep if she wakes up during the night. She does not take magnesium at night but is considering it to see if it helps with her sleep.   Patient presents today for diabetes & cholesterol follow up. She currently does not take any prescribed medications for cholesterol. She denies headache, chest pain & sob.   She questions two diagnoses she saw in her chart via mychart. Purpura: & COPD mixed type. She wants to know what these diagnoses mean.   She is also going on a cruise this Friday. She would like rx for patches in case of motion sickness.  She reports completing DM eye exam last week. MRF given to patient.   AWV scheduled for 2/12.  Hyperlipidemia This is a chronic problem. The current episode started more than 1 year ago. The problem is uncontrolled. Pertinent negatives include no chest pain, leg pain or shortness of breath. Current antihyperlipidemic treatment includes diet change. Risk factors for coronary artery disease include dyslipidemia and  post-menopausal.     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(HCC)      Family History  Problem Relation Age of Onset   Cancer - Ovarian Mother    Cancer - Colon Father    Parkinson's disease Father      Current Outpatient Medications:    ascorbic acid (VITAMIN C) 500 MG tablet, Take 500 mg by mouth daily., Disp: , Rfl:    budesonide -formoterol  (SYMBICORT ) 80-4.5 MCG/ACT inhaler,  Inhale 1 puff into the lungs up to 2 times daily as needed., Disp: 30.6 g, Rfl: 3   Cholecalciferol (VITAMIN D ) 2000 units tablet, Take 4,000-6,000 Units by mouth daily., Disp: , Rfl:    cyanocobalamin  (VITAMIN B12) 1000 MCG tablet, Take 1,000 mcg by mouth daily., Disp: , Rfl:    diphenhydrAMINE HCl, Sleep, 25 MG CAPS, Take 25 mg by mouth daily as needed (sleep)., Disp: , Rfl:    Ferrous Gluconate-C-Folic Acid (IRON-C PO), Take 65 mg by mouth., Disp: , Rfl:    ibuprofen (ADVIL,MOTRIN) 200 MG tablet, Take 600 mg by mouth 2 (two) times daily as needed for headache or moderate pain., Disp: , Rfl:    Magnesium 500 MG CAPS, Take 120 mg by mouth daily., Disp: , Rfl:    Multiple Vitamin (MULTIVITAMIN) capsule, Take 1 capsule by mouth daily., Disp: , Rfl:    Spacer/Aero-Holding Chambers DEVI, Use as directed with Symbicort  inhaler., Disp: 1 each, Rfl: 0   Allergies  Allergen Reactions   Augmentin  [Amoxicillin -Pot Clavulanate] Nausea And Vomiting   Sulfamethoxazole Rash     Review of Systems  Constitutional: Negative.   Respiratory: Negative.  Negative for shortness of breath.   Cardiovascular: Negative.  Negative for chest pain.  Neurological: Negative.   Psychiatric/Behavioral: Negative.       Today's Vitals   04/28/24 1506  BP: 110/70  Pulse: 74  Temp: 98.3 F (36.8 C)  SpO2: 98%  Weight: 155 lb 12.8 oz (70.7 kg)  Height: 5' 2 (1.575 m)   Body mass index is 28.5 kg/m.  Wt Readings from Last 3 Encounters:  04/28/24 155 lb 12.8 oz (70.7 kg)  03/01/24 154 lb (69.9 kg)  01/06/24 153 lb 3.2 oz (69.5 kg)     Objective:  Physical Exam Vitals and nursing note reviewed.  Constitutional:      Appearance: Normal appearance.  HENT:     Head: Normocephalic and atraumatic.  Eyes:     Extraocular Movements: Extraocular movements intact.  Cardiovascular:     Rate and Rhythm: Normal rate and regular rhythm.     Heart sounds: Normal heart sounds.  Pulmonary:     Effort: Pulmonary  effort is normal.     Breath sounds: Normal breath sounds.  Musculoskeletal:     Cervical back: Normal range of motion.  Skin:    General: Skin is warm.  Neurological:     General: No focal deficit present.     Mental Status: She is alert.  Psychiatric:        Mood and Affect: Mood normal.        Behavior: Behavior normal.      Assessment And Plan:  Prediabetes Assessment & Plan: Previous labs reviewed, her A1c has been elevated in the past. I will check an A1c today. Reminded to avoid refined sugars including sugary drinks/foods and processed meats including bacon, sausages and deli meats.     Pure hypercholesterolemia Assessment & Plan: LDL cholesterol increased to  126 mg/dL, likely due to sedentary lifestyle during cataract recovery. - Scheduled follow-up appointment in six months to monitor cholesterol levels.   Mixed stress and urge urinary incontinence Assessment & Plan: Experiencing stress and urge incontinence, worsened over the past couple of years. Interested in pelvic floor therapy. - Referred to pelvic floor therapy - Refer to Urogyn specialist, Caitlyn Zuletta, PA  Orders: -     Ambulatory referral to Urogynecology -     Ambulatory referral to Physical Therapy  Chronic insomnia Assessment & Plan: Chronic.  Difficulty sleeping without medication. Magnesium supplementation may aid sleep. - Advised taking magnesium at night to improve sleep quality.    Return for 4 month dm f/u.SABRA  Patient was given opportunity to ask questions. Patient verbalized understanding of the plan and was able to repeat key elements of the plan. All questions were answered to their satisfaction.   I, [name], MD, have reviewed all documentation for this visit. The documentation on 04/28/24 for the exam, diagnosis, procedures, and orders are all accurate and complete.   IF YOU HAVE BEEN REFERRED TO A SPECIALIST, IT MAY TAKE 1-2 WEEKS TO SCHEDULE/PROCESS THE REFERRAL. IF YOU  HAVE NOT HEARD FROM US /SPECIALIST IN TWO WEEKS, PLEASE GIVE US  A CALL AT [phone] X [phone].   THE PATIENT IS ENCOURAGED TO PRACTICE SOCIAL DISTANCING DUE TO THE COVID-19 PANDEMIC.

## 2024-04-28 NOTE — Patient Instructions (Signed)
 Prediabetes: What to Know Prediabetes is when your blood sugar, also called glucose, is at a higher level than normal but not high enough for you to be diagnosed with type 2 diabetes (type 2 diabetes mellitus). Having prediabetes puts you at risk for getting type 2 diabetes. By making some healthy changes, you may be able to prevent or delay getting type 2 diabetes. This is important because type 2 diabetes can lead to serious problems. Some of these include: Heart disease. Stroke. Blindness. Kidney disease. Depression. Poor blood flow in the feet and legs. In very bad cases, this could lead to having a leg removed by surgery (amputation). What are the causes? The exact cause of prediabetes isn't known. It may result from insulin resistance. Insulin resistance happens when cells in the body don't respond properly to insulin that the body makes. This can cause too much sugar to build up in the blood. High blood sugar, also called hyperglycemia, can develop. What increases the risk? Having a family member with type 2 diabetes. Being older than 79 years of age. Having had a temporary form of diabetes during a pregnancy. This is called gestational diabetes. Having had polycystic ovary syndrome (PCOS). Being overweight or obese. Being inactive and not getting much exercise. Having a history of heart disease. This may include problems with cholesterol levels, high levels of blood fats, or high blood pressure. What are the signs or symptoms? You may have no symptoms. If you do have symptoms, they may include: Increased hunger. Increased thirst. Needing to pee more often. Changes in how you see, like blurry vision. Feeling tired. How is this diagnosed? Prediabetes can be diagnosed with blood tests that check your blood sugar. One or more of these tests may be done: A fasting blood glucose (FBG) test. You won't be allowed to eat (you will fast) for at least 8 hours before a blood sample is  taken. An A1C blood test, also called a hemoglobin A1C test. This test shows information about blood sugar levels over the past 2?3 months. An oral glucose tolerance test (OGTT). This test measures your blood sugar at two points in time: After you haven't eaten for a while. This is your baseline level. Two hours after you drink a beverage that has sugar in it. You may be diagnosed with prediabetes if: Your FBG is 100?125 mg/dL (1.6-1.0 mmol/L). Your A1C level is 5.7?6.4% (39-46 mmol/mol). Your OGTT result is 140?199 mg/dL (9.6-04 mmol/L). These blood tests may need to be done again to be sure of the diagnosis. How is this treated? Treatment may include making changes to your diet and lifestyle. These changes can help lower your blood sugar and keep you from getting type 2 diabetes. In some cases, medicine may be given to help lower your risk. Follow these instructions at home: Eating and drinking  Eat and drink as told. Follow a healthy meal plan. This includes eating lean proteins, whole grains, legumes, fresh fruits and vegetables, low-fat dairy products, and healthy fats. Meet with an expert in healthy eating called a dietitian. This person can help create a healthy eating plan that's right for you. Lifestyle Do moderate-intensity exercise. Do this for at least 30 minutes a day on 5 or more days each week, or as told by your health care provider. A mix of activities may be best. Good choices include brisk walking, swimming, biking, and weight lifting. Try to lose weight if your provider says it's OK. Losing 5-7% of your body weight can  help reverse insulin resistance. Do not drink alcohol if: Your provider tells you not to drink. You're pregnant, may be pregnant, or plan to become pregnant. If you drink alcohol: Limit how much you have to: 0-1 drink a day if you're female. 0-2 drinks a day if you're female. Know how much alcohol is in your drink. In the [place] one drink is one 12 oz  bottle of beer (355 mL), one 5 oz glass of wine (148 mL), or one 1 oz glass of hard liquor (44 mL). General instructions Take medicines only as told. You may be given medicines that help lower the risk of type 2 diabetes. Do not smoke, vape, or use nicotine or tobacco. Where to find more information American Diabetes Association: diabetes.org/about-diabetes/prediabetes Academy of Nutrition and Dietetics: eatright.org American Heart Association: Go to ThisJobs.cz. Click the search icon. Type "prediabetes" in the search box. Contact a health care provider if: You have any of these symptoms: Increased hunger. Peeing more often than usual. Increased thirst. Feeling tired. Changes in how you see, like blurry vision. Feeling like you may throw up. Throwing up. Get help right away if: You have shortness of breath. You feel confused. This information is not intended to replace advice given to you by your health care provider. Make sure you discuss any questions you have with your health care provider. Document Revised: 01/05/2023 Document Reviewed: 01/05/2023 Elsevier Patient Education  2024 [employer].

## 2024-05-02 NOTE — Assessment & Plan Note (Signed)
 Previous labs reviewed, her A1c has been elevated in the past. I will check an A1c today. Reminded to avoid refined sugars including sugary drinks/foods and processed meats including bacon, sausages and deli meats.

## 2024-05-02 NOTE — Assessment & Plan Note (Addendum)
 Experiencing stress and urge incontinence, worsened over the past couple of years. Interested in pelvic floor therapy. - Referred to pelvic floor therapy - Refer to Urogyn specialist, Caitlyn Zuletta, PA

## 2024-05-02 NOTE — Assessment & Plan Note (Signed)
 Chronic.  Difficulty sleeping without medication. Magnesium supplementation may aid sleep. - Advised taking magnesium at night to improve sleep quality.

## 2024-05-02 NOTE — Assessment & Plan Note (Signed)
 LDL cholesterol increased to 126 mg/dL, likely due to sedentary lifestyle during cataract recovery. - Scheduled follow-up appointment in six months to monitor cholesterol levels.

## 2024-05-03 LAB — OPHTHALMOLOGY REPORT-SCANNED

## 2024-05-04 DIAGNOSIS — F4321 Adjustment disorder with depressed mood: Secondary | ICD-10-CM | POA: Diagnosis not present

## 2024-05-19 DIAGNOSIS — F4321 Adjustment disorder with depressed mood: Secondary | ICD-10-CM | POA: Diagnosis not present

## 2024-05-26 DIAGNOSIS — F4321 Adjustment disorder with depressed mood: Secondary | ICD-10-CM | POA: Diagnosis not present

## 2024-06-02 DIAGNOSIS — F4321 Adjustment disorder with depressed mood: Secondary | ICD-10-CM | POA: Diagnosis not present

## 2024-06-07 ENCOUNTER — Other Ambulatory Visit (HOSPITAL_BASED_OUTPATIENT_CLINIC_OR_DEPARTMENT_OTHER): Payer: Self-pay

## 2024-06-07 MED ORDER — COMIRNATY 30 MCG/0.3ML IM SUSY
0.3000 mL | PREFILLED_SYRINGE | Freq: Once | INTRAMUSCULAR | 0 refills | Status: AC
  Filled 2024-06-07: qty 0.3, 1d supply, fill #0

## 2024-06-15 ENCOUNTER — Telehealth (INDEPENDENT_AMBULATORY_CARE_PROVIDER_SITE_OTHER): Payer: Self-pay

## 2024-06-15 NOTE — Telephone Encounter (Signed)
 The patient called in and left a voicemail this am reporting that she accidentally wore her hearing aids in the shower last night.  She requested advise from our audiologists.   I checked with Dr Maynard, she advises to leave the hearing aids out to dry naturally for about a day.  She also stated not to place them in rice or near any heat sources and if they are not working properly in 1-2 days to call our office. I explained these notes to the patient and she voiced understanding.

## 2024-06-23 ENCOUNTER — Encounter: Payer: Self-pay | Admitting: Internal Medicine

## 2024-06-23 ENCOUNTER — Ambulatory Visit: Payer: Self-pay | Admitting: Internal Medicine

## 2024-06-23 VITALS — BP 118/70 | HR 77 | Temp 98.0°F | Ht 63.0 in | Wt 157.4 lb

## 2024-06-23 DIAGNOSIS — R051 Acute cough: Secondary | ICD-10-CM

## 2024-06-23 DIAGNOSIS — J209 Acute bronchitis, unspecified: Secondary | ICD-10-CM | POA: Diagnosis not present

## 2024-06-23 DIAGNOSIS — J069 Acute upper respiratory infection, unspecified: Secondary | ICD-10-CM

## 2024-06-23 DIAGNOSIS — J019 Acute sinusitis, unspecified: Secondary | ICD-10-CM

## 2024-06-23 DIAGNOSIS — J471 Bronchiectasis with (acute) exacerbation: Secondary | ICD-10-CM | POA: Diagnosis not present

## 2024-06-23 DIAGNOSIS — R0989 Other specified symptoms and signs involving the circulatory and respiratory systems: Secondary | ICD-10-CM | POA: Insufficient documentation

## 2024-06-23 DIAGNOSIS — B9689 Other specified bacterial agents as the cause of diseases classified elsewhere: Secondary | ICD-10-CM | POA: Diagnosis not present

## 2024-06-23 MED ORDER — DOXYCYCLINE HYCLATE 100 MG PO TABS: 100.0000 mg | ORAL_TABLET | Freq: Two times a day (BID) | ORAL | 0 refills | Status: AC

## 2024-06-23 NOTE — Patient Instructions (Signed)

## 2024-06-23 NOTE — Progress Notes (Signed)
 I,Tracey Castro, CMA,acting as a neurosurgeon for Tracey LOISE Slocumb, MD.,have documented all relevant documentation on the behalf of Tracey LOISE Slocumb, MD,as directed by  Tracey LOISE Slocumb, MD while in the presence of Tracey LOISE Slocumb, MD.  Subjective:  Patient ID: Tracey Castro , female    DOB: 04/06/1946 , 79 y.o.   MRN: [id number]  Chief Complaint  Patient presents with   Sinus Problem    Patient presents today for sinus issue & cough. She admits experiencing a fever this morning. Flu & covid combo test done on Monday, negative result. This initially started over the weekend.  At home she has tried mucinex, advil sinus , cough suppressant.     HPI Discussed the use of AI scribe software for clinical note transcription with the patient, who gave verbal consent to proceed.  History of Present Illness Tracey Castro is a 79 year old female with bronchiectasis who presents with symptoms of a sinus infection.  She has been experiencing symptoms suggestive of a sinus infection, including coughing and nasal congestion localized to the sinus area. The nasal discharge varies from clear to darker in color. The symptoms began over the weekend and worsened significantly by Monday. She experienced a fever of 100.4F this morning, which resolved after taking Advil Sinus.  She has been coughing up phlegm, which varies in color from clear to dark. The cough is severe enough to cause rib pain and disrupt her sleep, necessitating sleeping in a chair to breathe more easily. She has taken various over-the-counter medications, including a [facility] version of a cough suppressant, Advil Sinus, Mucinex, and a cold and flu medication, with limited relief.  She has a history of bronchiectasis. She has previously taken doxycycline  and Augmentin , with the latter causing gastrointestinal upset. She is allergic to sulfa drugs, which caused a rash many years ago.  She tested negative for COVID-19 and flu on  Monday, despite having had COVID-19 in the past. No current shortness of breath, although she experienced it last night. She also noted a postnasal drip and hoarseness at the onset of her symptoms.      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(HCC)      Family History  Problem Relation Age of Onset   Cancer - Ovarian Mother    Cancer - Colon Father    Parkinson's disease Father     Current Medications[1]   Allergies[2]   Review of Systems  Constitutional: Negative.   HENT:  Positive for congestion.   Respiratory:  Positive for cough.   Cardiovascular: Negative.   Gastrointestinal: Negative.   Neurological: Negative.   Psychiatric/Behavioral: Negative.       Today's Vitals   06/23/24 1526  BP: 118/70  Pulse: 77  Temp: 98 F (36.7 C)  SpO2: 98%  Weight: 157 lb 6.4 oz (71.4 kg)  Height: 5' 3 (1.6 m)   Body mass index is 27.88 kg/m.  Wt Readings from Last 3 Encounters:  06/23/24 157 lb 6.4 oz (71.4 kg)  04/28/24 155 lb 12.8 oz (70.7 kg)  03/01/24 154 lb (69.9 kg)    The 10-year ASCVD risk score (Arnett DK, et al., 2019) is: 36.5%   Values used to calculate the score:     Age: 60 years     Clinically relevant sex: Female     Is Non-Hispanic African American: No     Diabetic: Yes     Tobacco smoker:  No     Systolic Blood Pressure: 118 mmHg     Is BP treated: No     HDL Cholesterol: 54 mg/dL     Total Cholesterol: 203 mg/dL  Objective:  Physical Exam Vitals and nursing note reviewed.  Constitutional:      Appearance: Normal appearance.  HENT:     Head: Normocephalic and atraumatic.     Comments: Maxillary sinus tenderness Cardiovascular:     Rate and Rhythm: Normal rate and regular rhythm.     Heart sounds: Normal heart sounds.  Pulmonary:     Effort: Pulmonary effort is normal.     Breath sounds: Rhonchi present.  Musculoskeletal:     Cervical back: Normal range of motion.  Skin:    General: Skin is  warm.  Neurological:     General: No focal deficit present.     Mental Status: She is alert.  Psychiatric:        Mood and Affect: Mood normal.        Behavior: Behavior normal.         Assessment And Plan:   Assessment & Plan Acute bronchitis, unspecified organism Symptoms consistent with acute bronchitis, exacerbated by bronchiectasis. Negative COVID-19 and influenza tests, but repeat testing warranted due to recent fever. Doxycycline  chosen for treatment due to previous tolerance. - Perform repeat COVID-19 and influenza tests. - Prescribe doxycycline . - Call if test results necessitate treatment change. Acute bacterial sinusitis Treat with doxycycline .  Bronchiectasis with acute exacerbation (HCC) Increases susceptibility to respiratory infections, contributing to current acute bronchitis. Medication Sensitivity Sensitivity to Augmentin  and possible sulfa allergy  noted. Avoiding these medications to prevent adverse reactions. No orders of the defined types were placed in this encounter.  Return if symptoms worsen or fail to improve.  Patient was given opportunity to ask questions. Patient verbalized understanding of the plan and was able to repeat key elements of the plan. All questions were answered to their satisfaction.   I, Tracey LOISE Slocumb, MD, have reviewed all documentation for this visit. The documentation on 06/23/2024 for the exam, diagnosis, procedures, and orders are all accurate and complete.   IF YOU HAVE BEEN REFERRED TO A SPECIALIST, IT MAY TAKE 1-2 WEEKS TO SCHEDULE/PROCESS THE REFERRAL. IF YOU HAVE NOT HEARD FROM US /SPECIALIST IN TWO WEEKS, PLEASE GIVE US  A CALL AT [phone].      [1]  Current Outpatient Medications:    ascorbic acid (VITAMIN C) 500 MG tablet, Take 500 mg by mouth daily., Disp: , Rfl:    budesonide -formoterol  (SYMBICORT ) 80-4.5 MCG/ACT inhaler, Inhale 1 puff into the lungs up to 2 times daily as needed., Disp: 30.6 g, Rfl: 3    Cholecalciferol (VITAMIN D ) 2000 units tablet, Take 4,000-6,000 Units by mouth daily., Disp: , Rfl:    cyanocobalamin  (VITAMIN B12) 1000 MCG tablet, Take 1,000 mcg by mouth daily., Disp: , Rfl:    diphenhydrAMINE HCl, Sleep, 25 MG CAPS, Take 25 mg by mouth daily as needed (sleep)., Disp: , Rfl:    Ferrous Gluconate-C-Folic Acid (IRON-C PO), Take 65 mg by mouth., Disp: , Rfl:    ibuprofen (ADVIL,MOTRIN) 200 MG tablet, Take 600 mg by mouth 2 (two) times daily as needed for headache or moderate pain., Disp: , Rfl:    Magnesium 500 MG CAPS, Take 120 mg by mouth daily., Disp: , Rfl:    Multiple Vitamin (MULTIVITAMIN) capsule, Take 1 capsule by mouth daily., Disp: , Rfl:    Spacer/Aero-Holding Raguel FRENCH, Use as directed  with Symbicort  inhaler., Disp: 1 each, Rfl: 0   doxycycline  (VIBRA -TABS) 100 MG tablet, Take 1 tablet (100 mg total) by mouth 2 (two) times daily for 10 days., Disp: 20 tablet, Rfl: 0   prednisoLONE  acetate (PRED FORTE ) 1 % ophthalmic suspension, Place 1 drop into the affected eye(s) 4 (four) times daily for 5 days then stop., Disp: 5 mL, Rfl: 0 [2]  Allergies Allergen Reactions   Augmentin  [Amoxicillin -Pot Clavulanate] Nausea And Vomiting   Benzodiazepines    Sulfamethoxazole Rash

## 2024-06-24 ENCOUNTER — Telehealth (INDEPENDENT_AMBULATORY_CARE_PROVIDER_SITE_OTHER): Payer: Self-pay | Admitting: Audiology

## 2024-06-24 NOTE — Telephone Encounter (Signed)
 Patient called in stating that she had accidentally wore her Hearing Aids in the shower.  States that she has done everything recommended to dry them out.  Still staticy.  Hearing Aids still under warranty.  Patient requests call to see what needs to be done next.  [phone]

## 2024-06-25 ENCOUNTER — Ambulatory Visit (INDEPENDENT_AMBULATORY_CARE_PROVIDER_SITE_OTHER): Payer: Self-pay

## 2024-06-25 DIAGNOSIS — H903 Sensorineural hearing loss, bilateral: Secondary | ICD-10-CM

## 2024-06-25 NOTE — Progress Notes (Signed)
" °  [address] [place], [place] [address] [phone]  Hearing Aid Check     Tracey Castro comes for a scheduled appointment for a hearing aid check.  Time in:2:30 Time out: 2:59 Accompanied ab:dzoq   Right Left  Hearing aid manufacturer Oticon Intent 2 miniRITE SN:[id number] Oticon Intent 2 miniRITE SN:[id number]  Hearing aid style Receiver in the canal Receiver in the canal  Hearing aid battery rechargeable rechargeable  Receiver 2-60 1-60  Dome/ custom earpiece 6mm open domes 6 mm open domes  Retention wire      Warranty expiration date 09-18-2025 09-18-2025  Loss and Damage unknown unknown  Additional accessories Expiration date      Initial fitting date 08-26-2022 08-26-2022  [facility]    Chief complaint: Patient reports that the aids are intermittent after a week of drying them out after taking a shower/washing hair.  Actions taken: Inspection of the device and listening check showed that aid were intermittent.  A loaner was available only for the right ear at this time and thus it was programmed to her settings and paired to her hearing aid (Loaner SN:  Oticon Intent [id number]).    Services fee: $0 hearing aid under warranty  Patient was oriented about 2-3 weeks for return from repair  Recommend: Patient will be called when hearing aids return from repair and loaner hearing aid will be returned.   Arlean Rake, AUD "

## 2024-06-28 ENCOUNTER — Other Ambulatory Visit (HOSPITAL_BASED_OUTPATIENT_CLINIC_OR_DEPARTMENT_OTHER): Payer: Self-pay

## 2024-06-28 MED ORDER — PREDNISOLONE ACETATE 1 % OP SUSP
1.0000 [drp] | Freq: Four times a day (QID) | OPHTHALMIC | 0 refills | Status: AC
  Filled 2024-06-28: qty 5, 12d supply, fill #0

## 2024-06-29 ENCOUNTER — Other Ambulatory Visit (HOSPITAL_BASED_OUTPATIENT_CLINIC_OR_DEPARTMENT_OTHER): Payer: Self-pay

## 2024-07-03 NOTE — Assessment & Plan Note (Signed)
 Symptoms consistent with acute bronchitis, exacerbated by bronchiectasis. Negative COVID-19 and influenza tests, but repeat testing warranted due to recent fever. Doxycycline  chosen for treatment due to previous tolerance. - Perform repeat COVID-19 and influenza tests. - Prescribe doxycycline . - Call if test results necessitate treatment change.

## 2024-07-09 ENCOUNTER — Ambulatory Visit (INDEPENDENT_AMBULATORY_CARE_PROVIDER_SITE_OTHER): Payer: Self-pay | Admitting: Audiology

## 2024-07-09 DIAGNOSIS — H903 Sensorineural hearing loss, bilateral: Secondary | ICD-10-CM

## 2024-07-09 NOTE — Progress Notes (Signed)
" °  [address] [place], [place] [address] [phone]  Hearing Aid Check     Tracey Castro comes for a scheduled appointment for a hearing aid check.  Time in:8:45am Time out:9:10am Accompanied ab:dzoq   Right Left  Hearing aid manufacturer Oticon Intent 2 miniRITE New SN: [id number] Old SN:[id number] Oticon Intent 2 miniRITE New [id number] Old SN:[id number]  Hearing aid style Receiver in the canal Receiver in the canal  Hearing aid battery rechargeable rechargeable  Receiver 2-60 1-60  Dome/ custom earpiece 6mm open domes 6 mm open domes  Retention wire    Warranty expiration date 09-18-2025 09-18-2025  Loss and Damage unknown unknown  Initial fitting date 08-26-2022 08-26-2022  Device was [facility] at: [facility]    Reason for visit: Patient comes to pick up her hearing aids after they were sent in for repair.   Actions taken: Both aids had previous settings loaded and they were paired to her phone.  Services fee: $0 was paid at checkout.  Patient was oriented about pairing her hearing aids to her TV adapter.  Recommend: Return for a hearing aid check, as needed. Return for a hearing evaluation and to see an ENT, if concerns with hearing changes arise.    [name], AUD "

## 2024-07-12 ENCOUNTER — Other Ambulatory Visit (HOSPITAL_BASED_OUTPATIENT_CLINIC_OR_DEPARTMENT_OTHER): Payer: Self-pay

## 2024-07-21 ENCOUNTER — Encounter: Payer: Self-pay | Admitting: Physical Therapy

## 2024-07-21 ENCOUNTER — Other Ambulatory Visit: Payer: Self-pay

## 2024-07-21 ENCOUNTER — Ambulatory Visit: Admitting: Physical Therapy

## 2024-07-21 DIAGNOSIS — R293 Abnormal posture: Secondary | ICD-10-CM

## 2024-07-21 DIAGNOSIS — N393 Stress incontinence (female) (male): Secondary | ICD-10-CM

## 2024-07-21 DIAGNOSIS — M6281 Muscle weakness (generalized): Secondary | ICD-10-CM

## 2024-07-21 DIAGNOSIS — R279 Unspecified lack of coordination: Secondary | ICD-10-CM

## 2024-07-21 NOTE — Therapy (Signed)
 " OUTPATIENT PHYSICAL THERAPY FEMALE PELVIC EVALUATION   Patient Name: Tracey Castro MRN: [id number] DOB:Dec 12, 1945, 79 y.o., female Today's Date: 07/21/2024  END OF SESSION:  PT End of Session - 07/21/24 1101     Visit Number 1    Date for Recertification  10/18/24    Authorization Type Medicare    Progress Note Due on Visit 10    PT Start Time 1101    PT Stop Time 1143    PT Time Calculation (min) 42 min    Activity Tolerance Patient tolerated treatment well    Behavior During Therapy [facility] for tasks assessed/performed          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(HCC)    Past Surgical History:  Procedure Laterality Date   ABDOMINAL HYSTERECTOMY     BREAST CYST ASPIRATION     CATARACT EXTRACTION Bilateral 03/13/2024   Right, 04/06/24 - Dr. Meridee   INCISION AND DRAINAGE Left 08/28/2020   Procedure: INCISION AND DRAINAGE OF LEFT KNEE LACERATION;  Surgeon: Addie Cordella Hamilton, MD;  Location: [facility];  Service: Orthopedics;  Laterality: Left;   TOTAL HIP ARTHROPLASTY Right 07/05/2021   VIDEO BRONCHOSCOPY Bilateral 03/27/2018   Procedure: VIDEO BRONCHOSCOPY WITHOUT FLUORO;  Surgeon: Darlean Ozell NOVAK, MD;  Location: [facility];  Service: Cardiopulmonary;  Laterality: Bilateral;   Patient Active Problem List   Diagnosis Date Noted   Sinus symptom 06/23/2024   Mixed stress and urge urinary incontinence 04/28/2024   Prediabetes 01/18/2024   Chronic insomnia 01/18/2024   Sensorineural hearing loss, bilateral 08/20/2023   Tinnitus of both ears 08/20/2023   Estrogen deficiency 07/21/2023   History of anemia 07/21/2023   H/O motion sickness 07/21/2023   Post-nasal drip 05/06/2023   Nasal congestion 05/06/2023   Primary insomnia 04/01/2023   Purpura 07/04/2021   Pure hypercholesterolemia 07/04/2021   Dyslipidemia due to type 2 diabetes mellitus (HCC) 07/04/2021   Vitamin D  deficiency disease 07/04/2021   Iron  deficiency anemia 07/04/2021   Preoperative respiratory examination 06/19/2021   COPD GOLD  0/ AB  03/19/2018   Bronchiectasis without complication (HCC) 03/18/2018   Chronic sphenoidal sinusitis 11/11/2017   Cough 11/11/2017   Hoarseness of voice 11/11/2017   Rhinitis, chronic 11/11/2017    PCP: Jarold Medici, MD  REFERRING PROVIDER: Jarold Medici, MD  REFERRING DIAG: N39.46 (ICD-10-CM) - Mixed stress and urge urinary incontinence  THERAPY DIAG:  Muscle weakness (generalized)  Unspecified lack of coordination  Abnormal posture  Stress incontinence  Rationale for Evaluation and Treatment: Rehabilitation  ONSET DATE:   SUBJECTIVE:  SUBJECTIVE STATEMENT: Has urinary incontinence very small amounts usually with full bladder and then has small cough but with a cold or sickness much more leakage present and needs to wear a liner daily but larger pad with sickness. Also wears large pad if out of the house all day and unsure about being able to get to the bathroom quickly enough. Lifting grandson also causes urinary incontinence.  Works out at gym 3x weekly and doesn't usually have leakage with this.   Fluid intake: water - finds she drinks more in evenings but tries to et 1-2 water bottles daily (~25 oz), tea 1-2x daily  FUNCTIONAL LIMITATIONS: more cautious with community outings aware of bathrooms and needs pads   PERTINENT HISTORY:  Medications for current condition: n Surgeries: hysterectomy, right hip replacement  Other: n Sexual abuse: No  PAIN:  Are you having pain? No   PRECAUTIONS: None  RED FLAGS: None   WEIGHT BEARING RESTRICTIONS: No  FALLS:  Has patient fallen in last 6 months? No  OCCUPATION: retired   ACTIVITY LEVEL : moderate   PLOF: Independent  PATIENT  GOALS: to have no urinary incontinence, return to intercourse if able , not need to wear pads as much   BOWEL MOVEMENT: No concerns  URINATION: Pain with urination: No Fully empty bladder: Yes:                                           Post-void dribble: No Stream: Strong Urgency: No Frequency:every couple hours 2-3h                                              Nocturia: Yes:  varies sometimes not at all others 1x    Leakage: Urge to void, Walking to the bathroom, Coughing, Sneezing, Laughing, and Lifting Pads/briefs: Yes: 1 daily  INTERCOURSE:  Ability to have vaginal penetration Yes  Pain with intercourse: Initial Penetration and During Penetration Dryness: Yes  Climax: yes Marinoff Scale: 1/3 Lubricant: no - no penetration   PREGNANCY: Vaginal deliveries 0 C-section deliveries 0 Currently pregnant No  PROLAPSE: None   OBJECTIVE:  Note: Objective measures were completed at Evaluation unless otherwise noted.   COGNITION: Overall cognitive status: Within functional limits for tasks assessed     SENSATION: Light touch: Appears intact  FUNCTIONAL TESTS:   Single leg stance: hip drop bil Squat: Bed mobility:  GAIT: WFL  POSTURE: rounded shoulders and posterior pelvic tilt   LUMBARAROM/PROM:  A/PROM A/PROM  Eval (% available)  Flexion 75  Extension 100  Right lateral flexion 75  Left lateral flexion 75  Right rotation 75  Left rotation 75   (Blank rows = not tested)  LOWER EXTREMITY ROM:  Bil hamstrings and adductors limited by 25%  LOWER EXTREMITY MMT:  Bil hips grossly 4/5 PALPATION:  General: no TTP but does have tightness noted in bil lumbar paraspinals and bil piriformis    Abdominal: no TTP or tension noted                  External Perineal Exam: redness and dryness noted throughout vulva, labia adhesions noted mildly  Internal Pelvic Floor: TTP and tension throughout bil superficial and deep  layers  Patient confirms identification and approves PT to assess internal pelvic floor and treatment Yes  No emotional/communication barriers or cognitive limitation. Patient is motivated to learn. Patient understands and agrees with treatment goals and plan. PT explains patient will be examined in standing, sitting, and lying down to see how their muscles and joints work. When they are ready, they will be asked to remove their underwear so PT can examine their perineum. The patient is also given the option of providing their own chaperone as one is not provided in our facility. The patient also has the right and is explained the right to defer or refuse any part of the evaluation or treatment including the internal exam. With the patient's consent, PT will use one gloved finger to gently assess the muscles of the pelvic floor, seeing how well it contracts and relaxes and if there is muscle symmetry. After, the patient will get dressed and PT and patient will discuss exam findings and plan of care. PT and patient discuss plan of care, schedule, attendance policy and HEP activities.  All internal or external pelvic floor assessments and/or treatments are completed with proper hand hygiene and gloves hands. If needed gloves are changed with hand hygiene during patient care time.  PELVIC MMT:   MMT eval  Vaginal 2/5, 10s, 5 reps  Internal Anal Sphincter   External Anal Sphincter   Puborectalis   (Blank rows = not tested)        TONE: WFL  PROLAPSE: Not seen with cough in hooklying   TODAY'S TREATMENT:                                                                                                                              DATE:   07/21/24 EVAL Examination completed, findings reviewed, pt educated on POC, HEP, and bladder irritants. Education and cueing provided during internal assessment vaginally for improve techniques with pelvic floor activation and coordination. Pt motivated to participate  in PT and agreeable to attempt recommendations.     PATIENT EDUCATION:  Education details: [id number] Person educated: Patient Education method: Explanation, Demonstration, Tactile cues, Verbal cues, and Handouts Education comprehension: verbalized understanding, returned demonstration, verbal cues required, tactile cues required, and needs further education  HOME EXERCISE PROGRAM: [id number]  ASSESSMENT:  CLINICAL IMPRESSION: Patient is a 79 y.o. female  who was seen today for physical therapy evaluation and treatment for urinary incontinence worsening with colds. Pt reports she wears liners daily and sometimes through the night due to leakage, does have skin irritation with this but doesn't want to have leakage without pad in place and wet pants or underwear. Pt hasn't been sexually active with penetration due to pain and dryness felt with penetration but would like to increase this if able. Pt also demonstrates decreased flexibility in spine and bil hips, decreased core and hip strength, impaired posture and standing  single leg stability and pelvic stability. Patient consented to internal pelvic floor assessment vaginally this date and found to have decreased strength, endurance, and coordination. Patient benefited from verbal cues for improved technique with pelvic floor contractions and coordination as pt does demonstrate pushing down with attempts to contract pelvic floor, with cues able to improve slightly. Pt would benefit from additional PT to further address deficits.    OBJECTIVE IMPAIRMENTS: decreased activity tolerance, decreased coordination, decreased endurance, decreased mobility, decreased ROM, decreased strength, increased fascial restrictions, impaired perceived functional ability, increased muscle spasms, impaired flexibility, improper body mechanics, and postural dysfunction.   ACTIVITY LIMITATIONS: carrying, lifting, squatting, continence, and locomotion level  PARTICIPATION  LIMITATIONS: interpersonal relationship and community activity  PERSONAL FACTORS: Time since onset of injury/illness/exacerbation and 1 comorbidity: hysterectomy are also affecting patient's functional outcome.   REHAB POTENTIAL: Good  CLINICAL DECISION MAKING: Stable/uncomplicated  EVALUATION COMPLEXITY: Low   GOALS: Goals reviewed with patient? Yes  SHORT TERM GOALS: Target date: 07/21/24  Pt to be I with HEP for carry over and continuing recommendations for improved outcomes.   Baseline: Goal status: INITIAL  2.  Pt will be independent with the knack, urge suppression technique, and double voiding in order to improve bladder habits and decrease urinary incontinence.   Baseline:  Goal status: INITIAL  3.  Pt to demonstrate at least 30 mins upright midline posture without compensations for decreased strain at pelvic floor and improve leakage instance rate to improve skin integrity.  Baseline:  Goal status: INITIAL   4. Pt to be I with perineal moisturizers and lubricants for improved skin integrity and decreased pain with penetration.   Baseline:  Goal status: INITIAL   LONG TERM GOALS: Target date: 10/18/24  Pt to be I with advanced  HEP for carry over and continuing recommendations for improved outcomes.   Baseline:  Goal status: INITIAL  2.  Pt to demonstrate improved coordination of pelvic floor and breathing mechanics with 20# squat with appropriate synergistic patterns to decrease pain and leakage at least 75% of the time for improved ability to complete a 30 minute workout and lift grandson without strain at pelvic floor and symptoms.    Baseline:  Goal status: INITIAL  3.  Pt to report no more than 1 instance of urinary incontinence in a week for improved skin integrity and confidence with community outings.  Baseline:  Goal status: INITIAL  4.  Pt to report improved ability to have vaginal penetration to dilator size 5 or equivalent for improved tolerate to  intercourse and medical exams.  Baseline:  Goal status: INITIAL   PLAN:  PT FREQUENCY: 1x/week  PT DURATION: 8 sessions  PLANNED INTERVENTIONS: 97110-Therapeutic exercises, 97530- Therapeutic activity, 97112- Neuromuscular re-education, 97535- Self Care, [id number]- Manual therapy, [phone]- Canalith repositioning, [id number]- Aquatic Therapy, [phone]- Electrical stimulation (manual), [phone] (1-2 muscles), [phone] (3+ muscles)- Dry Needling, Patient/Family education, Taping, Joint mobilization, Spinal mobilization, Scar mobilization, DME instructions, Cryotherapy, Moist heat, and Biofeedback  PLAN FOR NEXT SESSION: coordination of pelvic floor with strengthening, pelvic floor strengthening, breathing mechanics, pressure management with exercise, and voiding mechanics    Darryle Navy, PT, DPT 07/21/2609:01 AM  [facility] [address] [place], [place] [address] Phone # [phone] Fax [phone]  "

## 2024-07-21 NOTE — Patient Instructions (Signed)

## 2024-08-24 ENCOUNTER — Ambulatory Visit: Admitting: Physical Therapy

## 2024-08-30 ENCOUNTER — Ambulatory Visit (HOSPITAL_BASED_OUTPATIENT_CLINIC_OR_DEPARTMENT_OTHER): Admitting: Pulmonary Disease

## 2024-09-01 ENCOUNTER — Ambulatory Visit: Admitting: Physical Therapy

## 2024-09-07 ENCOUNTER — Ambulatory Visit: Admitting: Physical Therapy

## 2024-09-08 ENCOUNTER — Ambulatory Visit: Admitting: Internal Medicine

## 2024-09-14 ENCOUNTER — Ambulatory Visit: Admitting: Physical Therapy

## 2025-02-16 ENCOUNTER — Ambulatory Visit: Payer: PRIVATE HEALTH INSURANCE
